# Patient Record
Sex: Female | Born: 1945 | Race: White | Marital: Married | Smoking: Former smoker
Health system: Southern US, Community
[De-identification: ages and names within clinical notes are randomized; demographics above are authoritative.]

## PROBLEM LIST (undated history)

## (undated) DIAGNOSIS — E785 Hyperlipidemia, unspecified: Secondary | ICD-10-CM

## (undated) DIAGNOSIS — F419 Anxiety disorder, unspecified: Secondary | ICD-10-CM

## (undated) DIAGNOSIS — G20 Parkinson's disease: Secondary | ICD-10-CM

## (undated) DIAGNOSIS — M8580 Other specified disorders of bone density and structure, unspecified site: Secondary | ICD-10-CM

## (undated) DIAGNOSIS — C3490 Malignant neoplasm of unspecified part of unspecified bronchus or lung: Secondary | ICD-10-CM

## (undated) DIAGNOSIS — M1990 Unspecified osteoarthritis, unspecified site: Secondary | ICD-10-CM

## (undated) DIAGNOSIS — K449 Diaphragmatic hernia without obstruction or gangrene: Secondary | ICD-10-CM

## (undated) DIAGNOSIS — G20A1 Parkinson's disease without dyskinesia, without mention of fluctuations: Secondary | ICD-10-CM

## (undated) DIAGNOSIS — I7300 Raynaud's syndrome without gangrene: Secondary | ICD-10-CM

## (undated) DIAGNOSIS — K219 Gastro-esophageal reflux disease without esophagitis: Secondary | ICD-10-CM

## (undated) DIAGNOSIS — T7840XA Allergy, unspecified, initial encounter: Secondary | ICD-10-CM

## (undated) DIAGNOSIS — J45909 Unspecified asthma, uncomplicated: Secondary | ICD-10-CM

## (undated) HISTORY — DX: Other specified disorders of bone density and structure, unspecified site: M85.80

## (undated) HISTORY — DX: Gastro-esophageal reflux disease without esophagitis: K21.9

## (undated) HISTORY — DX: Parkinson's disease without dyskinesia, without mention of fluctuations: G20.A1

## (undated) HISTORY — DX: Unspecified asthma, uncomplicated: J45.909

## (undated) HISTORY — DX: Hyperlipidemia, unspecified: E78.5

## (undated) HISTORY — PX: EYE SURGERY: SHX253

## (undated) HISTORY — DX: Diaphragmatic hernia without obstruction or gangrene: K44.9

## (undated) HISTORY — DX: Malignant neoplasm of unspecified part of unspecified bronchus or lung: C34.90

## (undated) HISTORY — DX: Parkinson's disease: G20

## (undated) HISTORY — DX: Raynaud's syndrome without gangrene: I73.00

## (undated) HISTORY — DX: Allergy, unspecified, initial encounter: T78.40XA

## (undated) HISTORY — PX: LUNG REMOVAL, PARTIAL: SHX233

## (undated) HISTORY — DX: Unspecified osteoarthritis, unspecified site: M19.90

## (undated) HISTORY — DX: Anxiety disorder, unspecified: F41.9

---

## 1993-03-08 HISTORY — PX: BACK SURGERY: SHX140

## 1993-03-08 HISTORY — PX: BREAST BIOPSY: SHX20

## 2015-03-09 HISTORY — PX: CATARACT EXTRACTION, BILATERAL: SHX1313

## 2015-08-20 HISTORY — PX: COLONOSCOPY: SHX174

## 2016-08-24 HISTORY — PX: REFRACTIVE SURGERY: SHX103

## 2018-03-08 DIAGNOSIS — C801 Malignant (primary) neoplasm, unspecified: Secondary | ICD-10-CM

## 2018-03-08 HISTORY — DX: Malignant (primary) neoplasm, unspecified: C80.1

## 2019-03-09 HISTORY — PX: ESOPHAGOGASTRODUODENOSCOPY: SHX1529

## 2020-07-23 ENCOUNTER — Encounter: Payer: Self-pay | Admitting: Family Medicine

## 2020-07-23 ENCOUNTER — Other Ambulatory Visit: Payer: Self-pay

## 2020-07-23 ENCOUNTER — Ambulatory Visit (INDEPENDENT_AMBULATORY_CARE_PROVIDER_SITE_OTHER): Payer: Medicare Other | Admitting: Family Medicine

## 2020-07-23 VITALS — BP 107/68 | HR 62 | Temp 97.9°F | Ht 65.5 in | Wt 125.6 lb

## 2020-07-23 DIAGNOSIS — G20 Parkinson's disease: Secondary | ICD-10-CM

## 2020-07-23 DIAGNOSIS — K219 Gastro-esophageal reflux disease without esophagitis: Secondary | ICD-10-CM | POA: Diagnosis not present

## 2020-07-23 DIAGNOSIS — F411 Generalized anxiety disorder: Secondary | ICD-10-CM | POA: Diagnosis not present

## 2020-07-23 DIAGNOSIS — J302 Other seasonal allergic rhinitis: Secondary | ICD-10-CM | POA: Diagnosis not present

## 2020-07-23 DIAGNOSIS — Z85118 Personal history of other malignant neoplasm of bronchus and lung: Secondary | ICD-10-CM

## 2020-07-23 DIAGNOSIS — R000 Tachycardia, unspecified: Secondary | ICD-10-CM

## 2020-07-23 DIAGNOSIS — L989 Disorder of the skin and subcutaneous tissue, unspecified: Secondary | ICD-10-CM

## 2020-07-23 NOTE — Progress Notes (Signed)
New Patient Office Visit  Assessment & Plan:  1. Seasonal allergies Discussed possibly adding Singulair in the future.  - fluticasone (FLONASE) 50 MCG/ACT nasal spray; Place 1 spray into both nostrils daily. - Ambulatory referral to ENT  2. Gastroesophageal reflux disease without esophagitis Well controlled on current regimen. Patient is going to try to wean off. - omeprazole (PRILOSEC) 20 MG capsule; Take 1 capsule by mouth daily at 2 PM.  3. Generalized anxiety disorder Discussed I do not prescribe Clonazepam for regular/long term use. She is weaning off. Currently taking every other day. Advised to only use twice weekly as needed. Does not feel she will need anything else for anxiety as she comes off of it. Currently well controlled.  - escitalopram (LEXAPRO) 20 MG tablet; Take 1 tablet by mouth daily at 2 PM. - clonazePAM (KLONOPIN) 0.5 MG tablet; Take 1 tablet by mouth daily at 2 PM.  4. Parkinson disease ([place]) Well controlled on current regimen.  - carbidopa-levodopa (SINEMET IR) 25-100 MG tablet; Take 2 tablets by mouth in the morning, at noon, and at bedtime.  Dispense: 180 tablet; Refill: 2 - Ambulatory referral to Neurology  5. Tachycardia Well controlled on current regimen.  - atenolol (TENORMIN) 25 MG tablet; Take 25 mg by mouth daily.  6. History of lung cancer - Ambulatory referral to Hematology / Oncology  7. Skin lesion of back - triamcinolone cream (KENALOG) 0.1 %; Apply 1 application topically 2 (two) times daily.  Dispense: 80 g; Refill: 1   Follow-up: Return in about 6 weeks (around 09/03/2020) for Anxiety.   Hendricks Limes, MSN, APRN, FNP-C [facility]  Subjective:  Patient ID: Deborah [name], female    DOB: [birth year]  Age: 75 y.o. MRN: [id number]  Patient Care Team: [name], FNP as PCP - General (Family Medicine)  CC:  Chief Complaint  Patient presents with  . New Patient (Initial Visit)    [place]   . Establish  Care  . spot on back     Patient has a spot on her back that she would like checked.     HPI Deborah [name]. [name] presents to establish care. She moved to [place] from [place] on April 1st.   Seasonal allergies: patient reports sinuses are awful this time of year. She is currently taking Zyrtec and Flonase daily. Has been seeing an ENT in [place].   GERD: patient reports she is currently taking Prilosec, but is weaning herself off. She was started on 40 mg after an EGD and has decreased herself to 20 mg. She plans to come off of it when her current supply is gone.   Anxiety: patient is currently taking Lexapro 20 mg once daily as well as Clonazepam 0.5 mg which was prescribed as once daily. She has been trying to wean herself off of them and has been taking the Clonazepam every other day for months. Twice she has had to take one on the day she skipped due to shaking. She still has ~#60 tablets at home.   GAD 7 : Generalized Anxiety Score 07/23/2020  Nervous, Anxious, on Edge 0  Control/stop worrying 0  Worry too much - different things 0  Trouble relaxing 0  Restless 0  Easily annoyed or irritable 0  Afraid - awful might happen 0  Total GAD 7 Score 0  Anxiety Difficulty Not difficult at all   Depression screen [facility] 2/9 07/23/2020  Decreased Interest 0  Down, Depressed, Hopeless 0  PHQ - 2 Score 0  Altered sleeping 0  Tired, decreased energy 0  Change in appetite 0  Feeling bad or failure about yourself  0  Trouble concentrating 0  Moving slowly or fidgety/restless 0  Suicidal thoughts 0  PHQ-9 Score 0  Difficult doing work/chores Not difficult at all    Parkinson's Disease: taking Sinemet. Previously managed by neurology.   Patient reports she takes Atenolol because she was having episodes of her heart racing. States she did see a cardiologist and was told her heart was doing something, but that it wasn't dangerous (PVCs?). The atenolol is effective.   History of lung CA:  patient reports she was diagnosed with stage I lung cancer in 2020. They were able to remove the small mass and she did not have to complete chemo or radiation. She was last seen on 06/02/2020 and reports they were going to see her one more time in 6 months before changing her to a yearly follow-up. Reports they do scans every time she has a follow-up.   Patient reports a spot on her back that has been present for a long time, that itches.    Review of Systems  Constitutional: Negative for chills, fever, malaise/fatigue and weight loss.  HENT: Negative for ear discharge, ear pain, nosebleeds and tinnitus.   Eyes: Negative for blurred vision, double vision, pain, discharge and redness.  Respiratory: Negative for cough, shortness of breath and wheezing.   Cardiovascular: Negative for chest pain, palpitations and leg swelling.  Gastrointestinal: Negative for abdominal pain, constipation, diarrhea, heartburn, nausea and vomiting.  Genitourinary: Negative for dysuria, frequency and urgency.  Musculoskeletal: Negative for myalgias.  Skin: Positive for itching. Negative for rash.  Neurological: Positive for tremors. Negative for dizziness, seizures, weakness and headaches.  Psychiatric/Behavioral: Negative for depression, substance abuse and suicidal ideas. The patient is not nervous/anxious.     Current Outpatient Medications:  .  atenolol (TENORMIN) 25 MG tablet, Take 25 mg by mouth daily., Disp: , Rfl:  .  carbidopa-levodopa (SINEMET IR) 25-100 MG tablet, Take 2 tablets by mouth in the morning, at noon, and at bedtime., Disp: , Rfl:  .  clonazePAM (KLONOPIN) 0.5 MG tablet, Take 1 tablet by mouth daily at 2 PM., Disp: , Rfl:  .  escitalopram (LEXAPRO) 20 MG tablet, Take 1 tablet by mouth daily at 2 PM., Disp: , Rfl:  .  fluticasone (FLONASE) 50 MCG/ACT nasal spray, Place 1 spray into both nostrils daily., Disp: , Rfl:  .  omeprazole (PRILOSEC) 20 MG capsule, Take 1 capsule by mouth daily at 2 PM.,  Disp: , Rfl:   Allergies  Allergen Reactions  . Amoxicillin-Pot Clavulanate Nausea And Vomiting  . Cefdinir Nausea And Vomiting  . Ciprofloxacin Hives and Itching  . Other     Bioxin   . Sulfa Antibiotics Hives and Itching    Past Medical History:  Diagnosis Date  . Anxiety   . Cancer ([place]) 2020   lung cancer   . Parkinson's syndrome [facility])     Past Surgical History:  Procedure Laterality Date  . BACK SURGERY  1995  . LUNG SURGERY      Family History  Problem Relation Age of Onset  . Dementia Mother   . Heart disease Father   . Diabetes Father   . Diabetes Brother     Social History   Socioeconomic History  . Marital status: Married    Spouse name: Not on file  . Number of children: Not  on file  . Years of education: Not on file  . Highest education level: Not on file  Occupational History  . Not on file  Tobacco Use  . Smoking status: Former Smoker    Types: Cigarettes    Quit date: 07/23/1960    Years since quitting: 60.0  . Smokeless tobacco: Never Used  Vaping Use  . Vaping Use: Never used  Substance and Sexual Activity  . Alcohol use: Yes    Comment: occ  . Drug use: Never  . Sexual activity: Yes    Birth control/protection: None  Other Topics Concern  . Not on file  Social History Narrative  . Not on file   Social Determinants of Health   Financial Resource Strain: Not on file  Food Insecurity: Not on file  Transportation Needs: Not on file  Physical Activity: Not on file  Stress: Not on file  Social Connections: Not on file  Intimate Partner Violence: Not on file    Objective:   Today's Vitals: BP 107/68   Pulse 62   Temp 97.9 F (36.6 C) (Temporal)   Ht 5' 5.5" (1.664 m)   Wt 125 lb 9.6 oz (57 kg)   SpO2 97%   BMI 20.58 kg/m   Physical Exam Vitals reviewed.  Constitutional:      General: She is not in acute distress.    Appearance: Normal appearance. She is normal weight. She is not ill-appearing, toxic-appearing or  diaphoretic.  HENT:     Head: Normocephalic and atraumatic.  Eyes:     General: No scleral icterus.       Right eye: No discharge.        Left eye: No discharge.     Conjunctiva/sclera: Conjunctivae normal.  Cardiovascular:     Rate and Rhythm: Normal rate and regular rhythm.     Heart sounds: Normal heart sounds. No murmur heard. No friction rub. No gallop.   Pulmonary:     Effort: Pulmonary effort is normal. No respiratory distress.     Breath sounds: Normal breath sounds. No stridor. No wheezing, rhonchi or rales.  Musculoskeletal:        General: Normal range of motion.     Cervical back: Normal range of motion.  Skin:    General: Skin is warm and dry.     Capillary Refill: Capillary refill takes less than 2 seconds.  Neurological:     General: No focal deficit present.     Mental Status: She is alert and oriented to person, place, and time. Mental status is at baseline.  Psychiatric:        Mood and Affect: Mood normal.        Behavior: Behavior normal.        Thought Content: Thought content normal.        Judgment: Judgment normal.

## 2020-07-24 ENCOUNTER — Telehealth: Payer: Self-pay | Admitting: Family Medicine

## 2020-07-24 MED ORDER — CARBIDOPA-LEVODOPA 25-100 MG PO TABS: 2.0000 | ORAL_TABLET | Freq: Three times a day (TID) | ORAL | 2 refills | Status: DC

## 2020-07-24 MED ORDER — TRIAMCINOLONE ACETONIDE 0.1 % EX CREA: 1.0000 "application " | TOPICAL_CREAM | Freq: Two times a day (BID) | CUTANEOUS | 1 refills | Status: DC

## 2020-07-24 NOTE — Telephone Encounter (Signed)
Sent!

## 2020-07-25 ENCOUNTER — Encounter: Payer: Self-pay | Admitting: Family Medicine

## 2020-07-28 ENCOUNTER — Encounter: Payer: Self-pay | Admitting: Family Medicine

## 2020-07-28 DIAGNOSIS — F411 Generalized anxiety disorder: Secondary | ICD-10-CM | POA: Insufficient documentation

## 2020-07-28 DIAGNOSIS — J302 Other seasonal allergic rhinitis: Secondary | ICD-10-CM | POA: Insufficient documentation

## 2020-07-28 DIAGNOSIS — R000 Tachycardia, unspecified: Secondary | ICD-10-CM | POA: Insufficient documentation

## 2020-07-28 DIAGNOSIS — G20 Parkinson's disease: Secondary | ICD-10-CM | POA: Insufficient documentation

## 2020-07-28 DIAGNOSIS — G20A1 Parkinson's disease without dyskinesia, without mention of fluctuations: Secondary | ICD-10-CM | POA: Insufficient documentation

## 2020-07-28 DIAGNOSIS — Z85118 Personal history of other malignant neoplasm of bronchus and lung: Secondary | ICD-10-CM | POA: Insufficient documentation

## 2020-07-28 DIAGNOSIS — K219 Gastro-esophageal reflux disease without esophagitis: Secondary | ICD-10-CM | POA: Insufficient documentation

## 2020-08-11 ENCOUNTER — Ambulatory Visit (HOSPITAL_COMMUNITY): Payer: Medicare Other | Admitting: Hematology

## 2020-08-14 ENCOUNTER — Observation Stay (HOSPITAL_COMMUNITY): Payer: Medicare Other

## 2020-08-14 ENCOUNTER — Other Ambulatory Visit: Payer: Self-pay

## 2020-08-14 ENCOUNTER — Encounter (HOSPITAL_COMMUNITY): Payer: Self-pay | Admitting: Emergency Medicine

## 2020-08-14 ENCOUNTER — Inpatient Hospital Stay (HOSPITAL_COMMUNITY)
Admission: EM | Admit: 2020-08-14 | Discharge: 2020-08-25 | DRG: 682 | Disposition: A | Discharge status: Skilled Nursing Facility | Payer: Medicare Other | Attending: Internal Medicine | Admitting: Internal Medicine

## 2020-08-14 DIAGNOSIS — Z87891 Personal history of nicotine dependence: Secondary | ICD-10-CM

## 2020-08-14 DIAGNOSIS — E860 Dehydration: Secondary | ICD-10-CM

## 2020-08-14 DIAGNOSIS — K219 Gastro-esophageal reflux disease without esophagitis: Secondary | ICD-10-CM

## 2020-08-14 DIAGNOSIS — Z902 Acquired absence of lung [part of]: Secondary | ICD-10-CM

## 2020-08-14 DIAGNOSIS — Z8249 Family history of ischemic heart disease and other diseases of the circulatory system: Secondary | ICD-10-CM

## 2020-08-14 DIAGNOSIS — E872 Acidosis: Secondary | ICD-10-CM | POA: Diagnosis present

## 2020-08-14 DIAGNOSIS — Z85118 Personal history of other malignant neoplasm of bronchus and lung: Secondary | ICD-10-CM

## 2020-08-14 DIAGNOSIS — G9341 Metabolic encephalopathy: Secondary | ICD-10-CM | POA: Diagnosis present

## 2020-08-14 DIAGNOSIS — D72818 Other decreased white blood cell count: Secondary | ICD-10-CM | POA: Diagnosis present

## 2020-08-14 DIAGNOSIS — F32A Depression, unspecified: Secondary | ICD-10-CM | POA: Diagnosis present

## 2020-08-14 DIAGNOSIS — G20 Parkinson's disease: Secondary | ICD-10-CM

## 2020-08-14 DIAGNOSIS — Z20822 Contact with and (suspected) exposure to covid-19: Secondary | ICD-10-CM | POA: Diagnosis present

## 2020-08-14 DIAGNOSIS — A419 Sepsis, unspecified organism: Secondary | ICD-10-CM

## 2020-08-14 DIAGNOSIS — J90 Pleural effusion, not elsewhere classified: Secondary | ICD-10-CM | POA: Diagnosis present

## 2020-08-14 DIAGNOSIS — E876 Hypokalemia: Secondary | ICD-10-CM

## 2020-08-14 DIAGNOSIS — F411 Generalized anxiety disorder: Secondary | ICD-10-CM | POA: Diagnosis not present

## 2020-08-14 DIAGNOSIS — E8770 Fluid overload, unspecified: Secondary | ICD-10-CM | POA: Diagnosis not present

## 2020-08-14 DIAGNOSIS — N179 Acute kidney failure, unspecified: Secondary | ICD-10-CM | POA: Diagnosis not present

## 2020-08-14 DIAGNOSIS — G20A1 Parkinson's disease without dyskinesia, without mention of fluctuations: Secondary | ICD-10-CM | POA: Diagnosis present

## 2020-08-14 DIAGNOSIS — Z833 Family history of diabetes mellitus: Secondary | ICD-10-CM

## 2020-08-14 DIAGNOSIS — E785 Hyperlipidemia, unspecified: Secondary | ICD-10-CM | POA: Diagnosis present

## 2020-08-14 DIAGNOSIS — J45909 Unspecified asthma, uncomplicated: Secondary | ICD-10-CM | POA: Diagnosis present

## 2020-08-14 DIAGNOSIS — T424X5A Adverse effect of benzodiazepines, initial encounter: Secondary | ICD-10-CM | POA: Diagnosis present

## 2020-08-14 DIAGNOSIS — I10 Essential (primary) hypertension: Secondary | ICD-10-CM | POA: Diagnosis present

## 2020-08-14 DIAGNOSIS — R6510 Systemic inflammatory response syndrome (SIRS) of non-infectious origin without acute organ dysfunction: Secondary | ICD-10-CM | POA: Diagnosis present

## 2020-08-14 DIAGNOSIS — Z79899 Other long term (current) drug therapy: Secondary | ICD-10-CM

## 2020-08-14 DIAGNOSIS — R0600 Dyspnea, unspecified: Secondary | ICD-10-CM

## 2020-08-14 DIAGNOSIS — D6959 Other secondary thrombocytopenia: Secondary | ICD-10-CM | POA: Diagnosis present

## 2020-08-14 DIAGNOSIS — E875 Hyperkalemia: Secondary | ICD-10-CM | POA: Diagnosis not present

## 2020-08-14 DIAGNOSIS — D696 Thrombocytopenia, unspecified: Secondary | ICD-10-CM

## 2020-08-14 DIAGNOSIS — I7300 Raynaud's syndrome without gangrene: Secondary | ICD-10-CM | POA: Diagnosis present

## 2020-08-14 DIAGNOSIS — E871 Hypo-osmolality and hyponatremia: Secondary | ICD-10-CM

## 2020-08-14 LAB — CBC WITH DIFFERENTIAL/PLATELET
Abs Immature Granulocytes: 0.02 10*3/uL (ref 0.00–0.07)
Abs Immature Granulocytes: 0.01 10*3/uL (ref 0.00–0.07)
Basophils Absolute: 0 10*3/uL (ref 0.0–0.1)
Basophils Absolute: 0 10*3/uL (ref 0.0–0.1)
Basophils Relative: 1 %
Basophils Relative: 0 %
Eosinophils Absolute: 0 10*3/uL (ref 0.0–0.5)
Eosinophils Absolute: 0 10*3/uL (ref 0.0–0.5)
Eosinophils Relative: 0 %
Eosinophils Relative: 0 %
HCT: 36.6 % (ref 36.0–46.0)
HCT: 32.7 % — ABNORMAL LOW (ref 36.0–46.0)
Hemoglobin: 12.5 g/dL (ref 12.0–15.0)
Hemoglobin: 11.1 g/dL — ABNORMAL LOW (ref 12.0–15.0)
Immature Granulocytes: 1 %
Immature Granulocytes: 1 %
Lymphocytes Relative: 11 %
Lymphocytes Relative: 14 %
Lymphs Abs: 0.2 10*3/uL — ABNORMAL LOW (ref 0.7–4.0)
Lymphs Abs: 0.3 10*3/uL — ABNORMAL LOW (ref 0.7–4.0)
MCH: 31.6 pg (ref 26.0–34.0)
MCH: 31.2 pg (ref 26.0–34.0)
MCHC: 34.2 g/dL (ref 30.0–36.0)
MCHC: 33.9 g/dL (ref 30.0–36.0)
MCV: 92.4 fL (ref 80.0–100.0)
MCV: 91.9 fL (ref 80.0–100.0)
Monocytes Absolute: 0.1 10*3/uL (ref 0.1–1.0)
Monocytes Absolute: 0.1 10*3/uL (ref 0.1–1.0)
Monocytes Relative: 5 %
Monocytes Relative: 5 %
Neutro Abs: 1.6 10*3/uL — ABNORMAL LOW (ref 1.7–7.7)
Neutro Abs: 1.5 10*3/uL — ABNORMAL LOW (ref 1.7–7.7)
Neutrophils Relative %: 82 %
Neutrophils Relative %: 80 %
Platelets: 62 10*3/uL — ABNORMAL LOW (ref 150–400)
Platelets: 54 10*3/uL — ABNORMAL LOW (ref 150–400)
RBC: 3.96 MIL/uL (ref 3.87–5.11)
RBC: 3.56 MIL/uL — ABNORMAL LOW (ref 3.87–5.11)
RDW: 14 % (ref 11.5–15.5)
RDW: 14 % (ref 11.5–15.5)
WBC Morphology: INCREASED
WBC Morphology: INCREASED
WBC: 2 10*3/uL — ABNORMAL LOW (ref 4.0–10.5)
WBC: 1.9 10*3/uL — ABNORMAL LOW (ref 4.0–10.5)
nRBC: 0 % (ref 0.0–0.2)
nRBC: 0 % (ref 0.0–0.2)

## 2020-08-14 LAB — COMPREHENSIVE METABOLIC PANEL
ALT: 24 U/L (ref 0–44)
AST: 165 U/L — ABNORMAL HIGH (ref 15–41)
Albumin: 3.1 g/dL — ABNORMAL LOW (ref 3.5–5.0)
Alkaline Phosphatase: 82 U/L (ref 38–126)
Anion gap: 8 (ref 5–15)
BUN: 24 mg/dL — ABNORMAL HIGH (ref 8–23)
CO2: 19 mmol/L — ABNORMAL LOW (ref 22–32)
Calcium: 8.1 mg/dL — ABNORMAL LOW (ref 8.9–10.3)
Chloride: 98 mmol/L (ref 98–111)
Creatinine, Ser: 0.98 mg/dL (ref 0.44–1.00)
GFR, Estimated: >60 mL/min (ref >60)
Glucose, Bld: 111 mg/dL — ABNORMAL HIGH (ref 70–99)
Potassium: 3.1 mmol/L — ABNORMAL LOW (ref 3.5–5.1)
Sodium: 125 mmol/L — ABNORMAL LOW (ref 135–145)
Total Bilirubin: 0.9 mg/dL (ref 0.3–1.2)
Total Protein: 6 g/dL — ABNORMAL LOW (ref 6.5–8.1)

## 2020-08-14 LAB — URINALYSIS, ROUTINE W REFLEX MICROSCOPIC
Bacteria, UA: NONE SEEN
Bilirubin Urine: NEGATIVE
Glucose, UA: NEGATIVE mg/dL
Ketones, ur: 5 mg/dL — AB
Leukocytes,Ua: NEGATIVE
Nitrite: NEGATIVE
Protein, ur: 100 mg/dL — AB
Specific Gravity, Urine: 1.015 (ref 1.005–1.030)
pH: 6 (ref 5.0–8.0)

## 2020-08-14 LAB — LACTIC ACID, PLASMA: Lactic Acid, Venous: 2.4 mmol/L (ref 0.5–1.9)

## 2020-08-14 LAB — PROCALCITONIN: Procalcitonin: 4.52 ng/mL

## 2020-08-14 LAB — RESP PANEL BY RT-PCR (FLU A&B, COVID) ARPGX2
Influenza A by PCR: NEGATIVE
Influenza B by PCR: NEGATIVE
SARS Coronavirus 2 by RT PCR: NEGATIVE

## 2020-08-14 MED ORDER — SODIUM CHLORIDE 0.9 % IV SOLN: 2.0000 g | Freq: Two times a day (BID) | INTRAVENOUS | Status: DC

## 2020-08-14 MED ORDER — ESCITALOPRAM OXALATE 10 MG PO TABS
20.0000 mg | ORAL_TABLET | Freq: Every day | ORAL | Status: DC
  Administered 2020-08-15 – 2020-08-19 (×5): 20 mg via ORAL
  Filled 2020-08-14 (×5): qty 2

## 2020-08-14 MED ORDER — SODIUM CHLORIDE 0.9 % IV BOLUS
1000.0000 mL | Freq: Once | INTRAVENOUS | Status: AC
  Administered 2020-08-14: 1000 mL via INTRAVENOUS

## 2020-08-14 MED ORDER — VANCOMYCIN HCL IN DEXTROSE 1-5 GM/200ML-% IV SOLN
1000.0000 mg | INTRAVENOUS | Status: DC
  Administered 2020-08-15 – 2020-08-17 (×3): 1000 mg via INTRAVENOUS
  Filled 2020-08-14 (×3): qty 200

## 2020-08-14 MED ORDER — LACTATED RINGERS IV BOLUS (SEPSIS)
500.0000 mL | Freq: Once | INTRAVENOUS | Status: AC
  Administered 2020-08-14: 500 mL via INTRAVENOUS

## 2020-08-14 MED ORDER — SODIUM CHLORIDE 0.9 % IV BOLUS
500.0000 mL | Freq: Once | INTRAVENOUS | Status: AC
  Administered 2020-08-14: 500 mL via INTRAVENOUS

## 2020-08-14 MED ORDER — LACTATED RINGERS IV BOLUS (SEPSIS): 1000.0000 mL | Freq: Once | INTRAVENOUS | Status: DC

## 2020-08-14 MED ORDER — SODIUM CHLORIDE 0.9 % IV SOLN
2.0000 g | Freq: Two times a day (BID) | INTRAVENOUS | Status: DC
  Administered 2020-08-14 – 2020-08-18 (×9): 2 g via INTRAVENOUS
  Filled 2020-08-14 (×10): qty 2

## 2020-08-14 MED ORDER — IOHEXOL 300 MG/ML  SOLN
100.0000 mL | Freq: Once | INTRAMUSCULAR | Status: AC | PRN
  Administered 2020-08-14: 100 mL via INTRAVENOUS

## 2020-08-14 MED ORDER — LACTATED RINGERS IV BOLUS (SEPSIS): 500.0000 mL | Freq: Once | INTRAVENOUS | Status: DC

## 2020-08-14 MED ORDER — CARBIDOPA-LEVODOPA 25-100 MG PO TABS
2.0000 | ORAL_TABLET | Freq: Three times a day (TID) | ORAL | Status: DC
  Administered 2020-08-15 – 2020-08-25 (×23): 2 via ORAL
  Filled 2020-08-14 (×28): qty 2

## 2020-08-14 MED ORDER — ACETAMINOPHEN 500 MG PO TABS
1000.0000 mg | ORAL_TABLET | Freq: Once | ORAL | Status: AC
  Administered 2020-08-14: 1000 mg via ORAL
  Filled 2020-08-14: qty 2

## 2020-08-14 MED ORDER — ACETAMINOPHEN 325 MG PO TABS
650.0000 mg | ORAL_TABLET | Freq: Four times a day (QID) | ORAL | Status: DC | PRN
  Administered 2020-08-15 – 2020-08-19 (×7): 650 mg via ORAL
  Filled 2020-08-14 (×8): qty 2

## 2020-08-14 MED ORDER — CLONAZEPAM 0.5 MG PO TABS
0.5000 mg | ORAL_TABLET | Freq: Every day | ORAL | Status: DC
  Administered 2020-08-15 – 2020-08-19 (×5): 0.5 mg via ORAL
  Filled 2020-08-14 (×6): qty 1

## 2020-08-14 MED ORDER — POTASSIUM CHLORIDE CRYS ER 20 MEQ PO TBCR
40.0000 meq | EXTENDED_RELEASE_TABLET | Freq: Once | ORAL | Status: AC
  Administered 2020-08-14: 40 meq via ORAL
  Filled 2020-08-14: qty 2

## 2020-08-14 MED ORDER — POTASSIUM CHLORIDE IN NACL 20-0.9 MEQ/L-% IV SOLN: INTRAVENOUS | Status: DC

## 2020-08-14 MED ORDER — METRONIDAZOLE 500 MG/100ML IV SOLN
500.0000 mg | Freq: Three times a day (TID) | INTRAVENOUS | Status: DC
  Administered 2020-08-14 – 2020-08-20 (×17): 500 mg via INTRAVENOUS
  Filled 2020-08-14 (×17): qty 100

## 2020-08-14 MED ORDER — PANTOPRAZOLE SODIUM 40 MG PO TBEC
40.0000 mg | DELAYED_RELEASE_TABLET | Freq: Every day | ORAL | Status: DC
  Administered 2020-08-15 – 2020-08-25 (×7): 40 mg via ORAL
  Filled 2020-08-14 (×10): qty 1

## 2020-08-14 MED ORDER — VANCOMYCIN HCL 1250 MG/250ML IV SOLN
1250.0000 mg | Freq: Once | INTRAVENOUS | Status: AC
  Administered 2020-08-14: 1250 mg via INTRAVENOUS
  Filled 2020-08-14: qty 250

## 2020-08-14 NOTE — ED Provider Notes (Signed)
[facility] EMERGENCY DEPARTMENT Provider Note   CSN: [id number] Arrival date & time: 08/14/20  6734     History Chief Complaint  Patient presents with   Weakness    Deborah [name] is a 75 y.o. female.  The history is provided by the patient. No language interpreter was used.  Weakness Severity:  Severe Onset quality:  Gradual Timing:  Constant Progression:  Worsening Chronicity:  New Context: recent infection   Relieved by:  Nothing Worsened by:  Nothing Ineffective treatments:  None tried Associated symptoms: no abdominal pain, no nausea, no near-syncope and no shortness of breath       Past Medical History:  Diagnosis Date   Anxiety    Arthritis    Asthma    Cancer ([place]) 2020   lung cancer    GERD (gastroesophageal reflux disease)    Hiatal hernia    Hyperlipidemia    Osteopenia    Parkinson's syndrome ([place])    Raynaud's disease     Patient Active Problem List   Diagnosis Date Noted   Seasonal allergies 07/28/2020   GERD (gastroesophageal reflux disease) 07/28/2020   Generalized anxiety disorder 07/28/2020   Parkinson disease ([place]) 07/28/2020   Tachycardia 07/28/2020   History of lung cancer 07/28/2020    Past Surgical History:  Procedure Laterality Date   BACK SURGERY  1995   Decompress disc lumbar   BREAST BIOPSY  1995   CATARACT EXTRACTION, BILATERAL Bilateral 2017   COLONOSCOPY  08/20/2015   repeat in 5 years   ESOPHAGOGASTRODUODENOSCOPY  2021   LUNG REMOVAL, PARTIAL Right    REFRACTIVE SURGERY Left 08/24/2016     OB History   No obstetric history on file.     Family History  Problem Relation Age of Onset   Dementia Mother    Heart disease Father    Diabetes Father    Prostate cancer Father    Diabetes Brother     Social History   Tobacco Use   Smoking status: Former    Pack years: 0.00    Types: Cigarettes    Quit date: 07/23/1960    Years since quitting: 60.1   Smokeless tobacco: Never  Vaping Use   Vaping Use:  Never used  Substance Use Topics   Alcohol use: Yes    Comment: occ   Drug use: Never    Home Medications Prior to Admission medications   Medication Sig Start Date End Date Taking? Authorizing Provider  atenolol (TENORMIN) 25 MG tablet Take 25 mg by mouth daily. 07/01/20  Yes [provider]  carbidopa-levodopa (SINEMET IR) 25-100 MG tablet Take 2 tablets by mouth in the morning, at noon, and at bedtime. 07/24/20  Yes Loman Brooklyn, FNP  clonazePAM (KLONOPIN) 0.5 MG tablet Take 1 tablet by mouth daily at 2 PM. 10/08/19  Yes [provider]  escitalopram (LEXAPRO) 20 MG tablet Take 1 tablet by mouth daily at 2 PM. 10/08/19  Yes [provider]  fluticasone (FLONASE) 50 MCG/ACT nasal spray Place 1 spray into both nostrils daily. 07/02/20  Yes [provider]  omeprazole (PRILOSEC) 20 MG capsule Take 1 capsule by mouth daily at 2 PM. 10/08/19  Yes [provider]  triamcinolone cream (KENALOG) 0.1 % Apply 1 application topically 2 (two) times daily. Patient not taking: No sig reported 07/24/20   Loman Brooklyn, FNP    Allergies    Amoxicillin-pot clavulanate, Cefdinir, Ciprofloxacin, Other, and Sulfa antibiotics  Review of Systems  Review of Systems  Respiratory:  Negative for shortness of breath.   Cardiovascular:  Negative for near-syncope.  Gastrointestinal:  Negative for abdominal pain and nausea.  Neurological:  Positive for weakness.  All other systems reviewed and are negative.  Physical Exam Updated Vital Signs BP (!) 117/56   Pulse 76   Temp 99.1 F (37.3 C) (Oral)   Resp 20   Ht 5\' 6"  (1.676 m)   Wt 55.3 kg   SpO2 98%   BMI 19.69 kg/m   Physical Exam Vitals and nursing note reviewed.  Constitutional:      Appearance: She is well-developed.  HENT:     Head: Normocephalic.     Mouth/Throat:     Mouth: Mucous membranes are moist.  Cardiovascular:     Rate and Rhythm: Normal rate and regular rhythm.  Pulmonary:      Effort: Pulmonary effort is normal.  Abdominal:     General: Abdomen is flat. There is no distension.  Musculoskeletal:        General: Normal range of motion.     Cervical back: Normal range of motion.  Skin:    General: Skin is warm.  Neurological:     General: No focal deficit present.     Mental Status: She is alert and oriented to person, place, and time.    ED Results / Procedures / Treatments   Labs (all labs ordered are listed, but only abnormal results are displayed) Labs Reviewed  CBC WITH DIFFERENTIAL/PLATELET - Abnormal; Notable for the following components:      Result Value   WBC 2.0 (*)    Platelets 62 (*)    Neutro Abs 1.6 (*)    Lymphs Abs 0.2 (*)    All other components within normal limits  COMPREHENSIVE METABOLIC PANEL - Abnormal; Notable for the following components:   Sodium 125 (*)    Potassium 3.1 (*)    CO2 19 (*)    Glucose, Bld 111 (*)    BUN 24 (*)    Calcium 8.1 (*)    Total Protein 6.0 (*)    Albumin 3.1 (*)    AST 165 (*)    All other components within normal limits  RESP PANEL BY RT-PCR (FLU A&B, COVID) ARPGX2  URINALYSIS, ROUTINE W REFLEX MICROSCOPIC    EKG None  Radiology No results found.  Procedures Procedures   Medications Ordered in ED Medications  sodium chloride 0.9 % bolus 1,000 mL (has no administration in time range)  potassium chloride SA (KLOR-CON) CR tablet 40 mEq (has no administration in time range)  sodium chloride 0.9 % bolus 500 mL (500 mLs Intravenous New Bag/Given 08/14/20 1114)    ED Course  I have reviewed the triage vital signs and the nursing notes.  Pertinent labs & imaging results that were available during my care of the patient were reviewed by me and considered in my medical decision making (see chart for details).    MDM Rules/Calculators/A&P                         MDM:  Pt given iv Ns x 500 Potassium 40 meq.   Final Clinical Impression(s) / ED Diagnoses Final diagnoses:  Dehydration   Hyponatremia  Hypokalemia    Rx / DC Orders ED Discharge Orders     None        Fransico Meadow, [place] 08/14/20 1611    Horton, Alvin Critchley, DO 08/16/20  0821  

## 2020-08-14 NOTE — H&P (Signed)
TRH H&P   Patient Demographics:    Deborah [name], is a 75 y.o. female  MRN: [id number]   DOB - 04-02-1945  Admit Date - 08/14/2020  Outpatient Primary MD for the patient is Loman Brooklyn, FNP  Referring MD/NP/PA: PA Sofia  Patient coming from: Home  Chief Complaint  Patient presents with   Weakness      HPI:    Deborah [name]  is a 75 y.o. female, with past medical history of Parkinson, depression, anxiety, GERD and hypertension, patient presents to ED secondary to multiple complaints, including nausea, vomiting, weakness and diarrhea, reports symptoms started 2 weeks ago after they ate in a steak place, reports both she and her husband he felt sick, but got better in few days, but her symptoms has persisted for last 2 weeks, reports nausea, poor appetite, initially some vomiting but none recently, still reports diarrhea 2-3 loose bowel movements per day, which prompted her to come to ED for fever, chills, blood in stools, coffee-ground emesis, no chest pain or shortness of breath. -In ED her work-up significant for sodium of 125, potassium of 3.1, AST of 164, bicarb of 19, white blood cell count of 2, and platelet count of 62,000, [facility] hospitalist consulted to admit.    Review of systems:    In addition to the HPI above,  No Fever-chills, does report generalized weakness and fatigue  no Headache, No changes with Vision or hearing, No problems swallowing food or Liquids, No Chest pain, Cough or Shortness of Breath, No Abdominal pain, she still reports some nausea, vomiting has resolved, she still reports diarrhea.  No Blood in stool or Urine, No dysuria, No new skin rashes or bruises, No new joints pains-aches,  No new weakness, tingling, numbness in any extremity, No recent weight gain or loss, No polyuria, polydypsia or polyphagia, No significant Mental Stressors.  A  full 10 point Review of Systems was done, except as stated above, all other Review of Systems were negative.   With Past History of the following :    Past Medical History:  Diagnosis Date   Anxiety    Arthritis    Asthma    Cancer ([place]) 2020   lung cancer    GERD (gastroesophageal reflux disease)    Hiatal hernia    Hyperlipidemia    Osteopenia    Parkinson's syndrome ([place])    Raynaud's disease       Past Surgical History:  Procedure Laterality Date   BACK SURGERY  1995   Decompress disc lumbar   BREAST BIOPSY  1995   CATARACT EXTRACTION, BILATERAL Bilateral 2017   COLONOSCOPY  08/20/2015   repeat in 5 years   ESOPHAGOGASTRODUODENOSCOPY  2021   LUNG REMOVAL, PARTIAL Right    REFRACTIVE SURGERY Left 08/24/2016      Social History:     Social History   Tobacco Use  Smoking status: Former    Pack years: 0.00    Types: Cigarettes    Quit date: 07/23/1960    Years since quitting: 60.1   Smokeless tobacco: Never  Substance Use Topics   Alcohol use: Yes    Comment: occ       Family History :     Family History  Problem Relation Age of Onset   Dementia Mother    Heart disease Father    Diabetes Father    Prostate cancer Father    Diabetes Brother      Home Medications:   Prior to Admission medications   Medication Sig Start Date End Date Taking? Authorizing Provider  atenolol (TENORMIN) 25 MG tablet Take 25 mg by mouth daily. 07/01/20  Yes [provider]  carbidopa-levodopa (SINEMET IR) 25-100 MG tablet Take 2 tablets by mouth in the morning, at noon, and at bedtime. 07/24/20  Yes Loman Brooklyn, FNP  clonazePAM (KLONOPIN) 0.5 MG tablet Take 1 tablet by mouth daily at 2 PM. 10/08/19  Yes [provider]  escitalopram (LEXAPRO) 20 MG tablet Take 1 tablet by mouth daily at 2 PM. 10/08/19  Yes [provider]  fluticasone (FLONASE) 50 MCG/ACT nasal spray Place 1 spray into both nostrils daily. 07/02/20  Yes [provider]   omeprazole (PRILOSEC) 20 MG capsule Take 1 capsule by mouth daily at 2 PM. 10/08/19  Yes [provider]  triamcinolone cream (KENALOG) 0.1 % Apply 1 application topically 2 (two) times daily. Patient not taking: No sig reported 07/24/20   Loman Brooklyn, FNP     Allergies:     Allergies  Allergen Reactions   Amoxicillin-Pot Clavulanate Nausea And Vomiting   Cefdinir Nausea And Vomiting   Ciprofloxacin Hives and Itching   Other     Bioxin    Sulfa Antibiotics Hives and Itching     Physical Exam:   Vitals  Blood pressure (!) 117/56, pulse 76, temperature 99.1 F (37.3 C), temperature source Oral, resp. rate 20, height 5\' 6"  (1.676 m), weight 55.3 kg, SpO2 98 %.   1. General frail, thin appearing female, laying in bed in no apparent distress  2. Normal affect and insight, Not Suicidal or Homicidal, Awake Alert, Oriented X 3.  3. No F.N deficits, ALL C.Nerves Intact, Strength 5/5 all 4 extremities, Sensation intact all 4 extremities, Plantars down going.  4. Ears and Eyes appear Normal, Conjunctivae clear, PERRLA. Moist Oral Mucosa.  5. Supple Neck, No JVD, No cervical lymphadenopathy appriciated, No Carotid Bruits.  6. Symmetrical Chest wall movement, Good air movement bilaterally, CTAB.  7. RRR, No Gallops, Rubs or Murmurs, No Parasternal Heave.  8. Positive Bowel Sounds, Abdomen Soft, No tenderness, No organomegaly appriciated,No rebound -guarding or rigidity.  9.  No Cyanosis, Normal Skin Turgor, No Skin Rash or Bruise.  10. Good muscle tone,  joints appear normal , no effusions, Normal ROM.  11. No Palpable Lymph Nodes in Neck or Axillae    Data Review:    CBC Recent Labs  Lab 08/14/20 1107  WBC 2.0*  HGB 12.5  HCT 36.6  PLT 62*  MCV 92.4  MCH 31.6  MCHC 34.2  RDW 14.0  LYMPHSABS 0.2*  MONOABS 0.1  EOSABS 0.0  BASOSABS 0.0    ------------------------------------------------------------------------------------------------------------------  Chemistries  Recent Labs  Lab 08/14/20 1107  NA 125*  K 3.1*  CL 98  CO2 19*  GLUCOSE 111*  BUN 24*  CREATININE 0.98  CALCIUM 8.1*  AST 165*  ALT 24  ALKPHOS 82  BILITOT 0.9   ------------------------------------------------------------------------------------------------------------------ estimated creatinine clearance is 44.6 mL/min (by C-G formula based on SCr of 0.98 mg/dL). ------------------------------------------------------------------------------------------------------------------ No results for input(s): TSH, T4TOTAL, T3FREE, THYROIDAB in the last 72 hours.  Invalid input(s): FREET3  Coagulation profile No results for input(s): INR, PROTIME in the last 168 hours. ------------------------------------------------------------------------------------------------------------------- No results for input(s): DDIMER in the last 72 hours. -------------------------------------------------------------------------------------------------------------------  Cardiac Enzymes No results for input(s): CKMB, TROPONINI, MYOGLOBIN in the last 168 hours.  Invalid input(s): CK ------------------------------------------------------------------------------------------------------------------ No results found for: BNP   ---------------------------------------------------------------------------------------------------------------  Urinalysis No results found for: COLORURINE, APPEARANCEUR, LABSPEC, [place], GLUCOSEU, HGBUR, BILIRUBINUR, KETONESUR, PROTEINUR, UROBILINOGEN, NITRITE, LEUKOCYTESUR  ----------------------------------------------------------------------------------------------------------------   Imaging Results:    No results found.  My personal review of EKG: Rhythm NSR,  Vent. rate 83 BPM PR interval 113 ms QRS duration 90 ms QT/QTcB 359/422  ms P-R-T axes 60 77 78 Sinus rhythm Borderline short PR interval   Assessment & Plan:    Active Problems:   GERD (gastroesophageal reflux disease)   Generalized anxiety disorder   Parkinson disease ([place])   Hyponatremia  GI/vomiting/diarrhea -This is most likely due to gastroenteritis, her abdominal exam is benign, she is afebrile, still reports diarrhea but improving, no blood in her stools, I will obtain GI panel, continue with IV hydration and Zofran .  Patient with low bicarb level secondary to diarrhea, but this should correct with IV fluids.  Hyponatremia -Due to volume depletion, will start on IV normal saline, repeat BMP in a.m. Mar[place] Kitchen  Hypokalemia -due to nausea, vomiting and diarrhea, will start on potassium supplements   elevated AST -Patient denies any alcohol use, will check hepatitis panel, and abdominal ultrasound including liver and spleen specially she has thrombocytopenia .  Thrombocytopenia -SCD for DVT prophylaxis, no chemical anticoagulation, will check ultrasound abdomen.  Hypertension -Her blood pressure is acceptable, I will hold atenolol given poor oral intake and diarrhea  Leukopenia -Most likely due to her GI infection, will monitor closely  Parkinson -Continue with home medications   GERD -continue with home medications  Depression/anxiety -continue with home medications   DVT Prophylaxis  SCDs   AM Labs Ordered, also please review Full Orders  Family Communication: Admission, patients condition and plan of care including tests being ordered have been discussed with the patient and Husband at [place] indicate understanding and agree with the plan and Code Status.  Code Status Full  Likely DC to  home  Condition GUARDED    Consults called: none    Admission status: observation    Time spent in minutes : 60 minutes   Phillips Climes M.D on 08/14/2020 at 4:07 PM   [facility] - Office  [phone]

## 2020-08-14 NOTE — Progress Notes (Signed)
Patient with fever 102.8, will start on broad-spectrum antibiotic vancomycin, cefepime and Flagyl especially with her leukopenia, I will obtain CT chest/abdomen/with IV contrast to evaluate for source of infection, will send blood cultures, urine analysis and lactic acid, will give 500 cc fluid bolus as well. Phillips Climes MD

## 2020-08-14 NOTE — Progress Notes (Signed)
Pharmacy Antibiotic Note  Deborah [name] is a 75 y.o. female admitted on 08/14/2020 with sepsis and intra-abdominal infection .  Pharmacy has been consulted for cefepime and vancomycin dosing.  Plan: Vancomycin 1250mg  (20mg /kg) loading dose followed by 1000mg  IV every 24 hours.  Goal trough 15-20 mcg/mL. The dose of cefepime will be adjusted to 2gm q12h based on renal function.  Height: 5\' 6"  (167.6 cm) Weight: 61.4 kg (135 lb 5.8 oz) IBW/kg (Calculated) : 59.3  Temp (24hrs), Avg:100.3 F (37.9 C), Min:99.1 F (37.3 C), Max:102.8 F (39.3 C)  Recent Labs  Lab 08/14/20 1107 08/14/20 1734  WBC 2.0* 1.9*  CREATININE 0.98  --     Estimated Creatinine Clearance: 47.9 mL/min (by C-G formula based on SCr of 0.98 mg/dL).    Allergies  Allergen Reactions   Amoxicillin-Pot Clavulanate Nausea And Vomiting   Cefdinir Nausea And Vomiting   Ciprofloxacin Hives and Itching   Other     Bioxin    Sulfa Antibiotics Hives and Itching    Antimicrobials this admission: Metronidazole 500mg  IV Q8H  6/9 >>   Dose adjustments this admission:  Microbiology results: 6/9 BCx: pending Resp panel (6/9) negative COVID-19 and influenza  Thank you for allowing pharmacy to be a part of this patient's care.  Jordan Hawks Deserie Dirks 08/14/2020 7:12 PM

## 2020-08-14 NOTE — ED Notes (Signed)
Patient stated she needed to go to bathroom.  Patient at this time is too weak to walk - she could barely get up from wheelchair to stretcher.  Purwick placed.  Patient stated she was still attempting to go to bathroom - "she had to adjust"

## 2020-08-14 NOTE — Progress Notes (Signed)
   08/14/20 1851  Assess: MEWS Score  Temp (!) 102.8 F (39.3 C)  BP (!) 116/56  Pulse Rate 75  Resp (!) 22  SpO2 95 %  O2 Device Room Air  Assess: MEWS Score  MEWS Temp 2  MEWS Systolic 0  MEWS Pulse 0  MEWS RR 1  MEWS LOC 0  MEWS Score 3  MEWS Score Color Yellow  Treat  Pain Scale 0-10  Pain Score 10  Pain Type Acute pain  Pain Location Leg  Take Vital Signs  Increase Vital Sign Frequency  Yellow: Q 2hr X 2 then Q 4hr X 2, if remains yellow, continue Q 4hrs  Notify: Charge Nurse/RN  Name of Charge Nurse/RN Notified Jacques Navy, RN  Date Charge Nurse/RN Notified 08/14/20  Time Charge Nurse/RN Notified [place]  Notify: Provider  Provider Name/Title Dr. Waldron Labs  Date Provider Notified 08/14/20  Time Provider Notified [place]  Notification Type Page  Notification Reason Change in status  Provider response See new orders  Date of Provider Response 08/14/20  Time of Provider Response 1858

## 2020-08-14 NOTE — ED Triage Notes (Signed)
Pt c/o weakness with N/V/D for the last 10 days.

## 2020-08-15 DIAGNOSIS — E872 Acidosis: Secondary | ICD-10-CM | POA: Diagnosis present

## 2020-08-15 DIAGNOSIS — Z8249 Family history of ischemic heart disease and other diseases of the circulatory system: Secondary | ICD-10-CM | POA: Diagnosis not present

## 2020-08-15 DIAGNOSIS — R6510 Systemic inflammatory response syndrome (SIRS) of non-infectious origin without acute organ dysfunction: Secondary | ICD-10-CM

## 2020-08-15 DIAGNOSIS — J90 Pleural effusion, not elsewhere classified: Secondary | ICD-10-CM | POA: Diagnosis present

## 2020-08-15 DIAGNOSIS — J45909 Unspecified asthma, uncomplicated: Secondary | ICD-10-CM | POA: Diagnosis present

## 2020-08-15 DIAGNOSIS — G20 Parkinson's disease: Secondary | ICD-10-CM | POA: Diagnosis present

## 2020-08-15 DIAGNOSIS — E876 Hypokalemia: Secondary | ICD-10-CM | POA: Diagnosis present

## 2020-08-15 DIAGNOSIS — Z85118 Personal history of other malignant neoplasm of bronchus and lung: Secondary | ICD-10-CM | POA: Diagnosis not present

## 2020-08-15 DIAGNOSIS — D6959 Other secondary thrombocytopenia: Secondary | ICD-10-CM | POA: Diagnosis present

## 2020-08-15 DIAGNOSIS — R4182 Altered mental status, unspecified: Secondary | ICD-10-CM | POA: Diagnosis not present

## 2020-08-15 DIAGNOSIS — R0603 Acute respiratory distress: Secondary | ICD-10-CM | POA: Diagnosis not present

## 2020-08-15 DIAGNOSIS — Z87891 Personal history of nicotine dependence: Secondary | ICD-10-CM | POA: Diagnosis not present

## 2020-08-15 DIAGNOSIS — G9341 Metabolic encephalopathy: Secondary | ICD-10-CM | POA: Diagnosis present

## 2020-08-15 DIAGNOSIS — Z833 Family history of diabetes mellitus: Secondary | ICD-10-CM | POA: Diagnosis not present

## 2020-08-15 DIAGNOSIS — E871 Hypo-osmolality and hyponatremia: Secondary | ICD-10-CM | POA: Diagnosis present

## 2020-08-15 DIAGNOSIS — F32A Depression, unspecified: Secondary | ICD-10-CM | POA: Diagnosis present

## 2020-08-15 DIAGNOSIS — I10 Essential (primary) hypertension: Secondary | ICD-10-CM | POA: Diagnosis present

## 2020-08-15 DIAGNOSIS — Z902 Acquired absence of lung [part of]: Secondary | ICD-10-CM | POA: Diagnosis not present

## 2020-08-15 DIAGNOSIS — Z20822 Contact with and (suspected) exposure to covid-19: Secondary | ICD-10-CM | POA: Diagnosis present

## 2020-08-15 DIAGNOSIS — N179 Acute kidney failure, unspecified: Secondary | ICD-10-CM | POA: Diagnosis present

## 2020-08-15 DIAGNOSIS — E860 Dehydration: Secondary | ICD-10-CM | POA: Diagnosis present

## 2020-08-15 DIAGNOSIS — D72818 Other decreased white blood cell count: Secondary | ICD-10-CM | POA: Diagnosis present

## 2020-08-15 DIAGNOSIS — K219 Gastro-esophageal reflux disease without esophagitis: Secondary | ICD-10-CM | POA: Diagnosis present

## 2020-08-15 DIAGNOSIS — F411 Generalized anxiety disorder: Secondary | ICD-10-CM | POA: Diagnosis present

## 2020-08-15 DIAGNOSIS — E785 Hyperlipidemia, unspecified: Secondary | ICD-10-CM | POA: Diagnosis present

## 2020-08-15 LAB — CBC
HCT: 34.2 % — ABNORMAL LOW (ref 36.0–46.0)
Hemoglobin: 11.5 g/dL — ABNORMAL LOW (ref 12.0–15.0)
MCH: 30.7 pg (ref 26.0–34.0)
MCHC: 33.6 g/dL (ref 30.0–36.0)
MCV: 91.2 fL (ref 80.0–100.0)
Platelets: 53 10*3/uL — ABNORMAL LOW (ref 150–400)
RBC: 3.75 MIL/uL — ABNORMAL LOW (ref 3.87–5.11)
RDW: 14.1 % (ref 11.5–15.5)
WBC: 2.5 10*3/uL — ABNORMAL LOW (ref 4.0–10.5)
nRBC: 0 % (ref 0.0–0.2)

## 2020-08-15 LAB — BASIC METABOLIC PANEL
Anion gap: 7 (ref 5–15)
BUN: 18 mg/dL (ref 8–23)
CO2: 19 mmol/L — ABNORMAL LOW (ref 22–32)
Calcium: 7.3 mg/dL — ABNORMAL LOW (ref 8.9–10.3)
Chloride: 103 mmol/L (ref 98–111)
Creatinine, Ser: 0.9 mg/dL (ref 0.44–1.00)
GFR, Estimated: >60 mL/min (ref >60)
Glucose, Bld: 88 mg/dL (ref 70–99)
Potassium: 3.5 mmol/L (ref 3.5–5.1)
Sodium: 129 mmol/L — ABNORMAL LOW (ref 135–145)

## 2020-08-15 LAB — PROCALCITONIN: Procalcitonin: 4.34 ng/mL

## 2020-08-15 LAB — HEPATIC FUNCTION PANEL
ALT: 14 U/L (ref 0–44)
AST: 174 U/L — ABNORMAL HIGH (ref 15–41)
Albumin: 2.7 g/dL — ABNORMAL LOW (ref 3.5–5.0)
Alkaline Phosphatase: 90 U/L (ref 38–126)
Bilirubin, Direct: 0.2 mg/dL (ref 0.0–0.2)
Indirect Bilirubin: 0.7 mg/dL (ref 0.3–0.9)
Total Bilirubin: 0.9 mg/dL (ref 0.3–1.2)
Total Protein: 5.1 g/dL — ABNORMAL LOW (ref 6.5–8.1)

## 2020-08-15 LAB — HEPATITIS PANEL, ACUTE
HCV Ab: NONREACTIVE
Hep A IgM: NONREACTIVE
Hep B C IgM: NONREACTIVE
Hepatitis B Surface Ag: NONREACTIVE

## 2020-08-15 LAB — PROTIME-INR
INR: 1.1 (ref 0.8–1.2)
Prothrombin Time: 14.6 seconds (ref 11.4–15.2)

## 2020-08-15 LAB — APTT: aPTT: 46 seconds — ABNORMAL HIGH (ref 24–36)

## 2020-08-15 MED ORDER — SACCHAROMYCES BOULARDII 250 MG PO CAPS
250.0000 mg | ORAL_CAPSULE | Freq: Two times a day (BID) | ORAL | Status: DC
  Administered 2020-08-15 – 2020-08-25 (×15): 250 mg via ORAL
  Filled 2020-08-15 (×19): qty 1

## 2020-08-15 MED ORDER — ENSURE ENLIVE PO LIQD
237.0000 mL | Freq: Three times a day (TID) | ORAL | Status: DC
  Administered 2020-08-15 – 2020-08-24 (×10): 237 mL via ORAL

## 2020-08-15 NOTE — Progress Notes (Signed)
   08/15/20 1113  Assess: MEWS Score  Temp 99 F (37.2 C)  BP (!) 93/49  Pulse Rate 79  Resp (!) 29  SpO2 92 %  Assess: MEWS Score  MEWS Temp 0  MEWS Systolic 1  MEWS Pulse 0  MEWS RR 2  MEWS LOC 0  MEWS Score 3  MEWS Score Color Yellow  Assess: if the MEWS score is Yellow or Red  Were vital signs taken at a resting state? Yes  Focused Assessment Change from prior assessment (see assessment flowsheet)  Early Detection of Sepsis Score *See Row Information* High  MEWS guidelines implemented *See Row Information* Yes  Treat  MEWS Interventions Escalated (See documentation below)  Pain Scale 0-10  Pain Score 0  Take Vital Signs  Increase Vital Sign Frequency  Yellow: Q 2hr X 2 then Q 4hr X 2, if remains yellow, continue Q 4hrs  Escalate  MEWS: Escalate Yellow: discuss with charge nurse/RN and consider discussing with provider and RRT  Notify: Charge Nurse/RN  Name of Charge Nurse/RN Notified Marine scientist  Date Charge Nurse/RN Notified 08/15/20  Time Charge Nurse/RN Notified 1120  Notify: Provider  Provider Name/Title Dyann Kief MD  Date Provider Notified 08/15/20  Time Provider Notified 1125  Notification Type  (Message)

## 2020-08-15 NOTE — Plan of Care (Signed)
  Problem: Acute Rehab PT Goals(only PT should resolve) Goal: Pt Will Go Supine/Side To Sit Outcome: Progressing Flowsheets (Taken 08/15/2020 1054) Pt will go Supine/Side to Sit: with modified independence Goal: Patient Will Transfer Sit To/From Stand Outcome: Progressing Flowsheets (Taken 08/15/2020 1054) Patient will transfer sit to/from stand: with supervision Goal: Pt Will Transfer Bed To Chair/Chair To Bed Outcome: Progressing Flowsheets (Taken 08/15/2020 1054) Pt will Transfer Bed to Chair/Chair to Bed: with supervision Goal: Pt Will Ambulate Outcome: Progressing Flowsheets (Taken 08/15/2020 1054) Pt will Ambulate:  100 feet  with min guard assist  with supervision  with rolling walker   10:55 AM, 08/15/20 Lonell Grandchild, MPT Physical Therapist with [facility] 336 [phone] office [phone] mobile phone

## 2020-08-15 NOTE — Progress Notes (Signed)
PROGRESS NOTE    Deborah [name]  OEU:[id number] DOB: [birth year] DOA: 08/14/2020 PCP: Loman Brooklyn, FNP   Chief Complaint  Patient presents with   Weakness    Brief admission narrative:  As per H&P written by Dr. Waldron Labs on 08/15/2018    Deborah [name]  is a 75 y.o. female, with past medical history of Parkinson, depression, anxiety, GERD and hypertension, patient presents to ED secondary to multiple complaints, including nausea, vomiting, weakness and diarrhea, reports symptoms started 2 weeks ago after they ate in a steak place, reports both she and her husband he felt sick, but got better in few days, but her symptoms has persisted for last 2 weeks, reports nausea, poor appetite, initially some vomiting but none recently, still reports diarrhea 2-3 loose bowel movements per day, which prompted her to come to ED for fever, chills, blood in stools, coffee-ground emesis, no chest pain or shortness of breath. -In ED her work-up significant for sodium of 125, potassium of 3.1, AST of 164, bicarb of 19, white blood cell count of 2, and platelet count of 62,000, [facility] consulted to admit.  Assessment & Plan: 1-SIRS -Presenting with vomiting, diarrhea, nausea and dehydration -Spiking high-grade temperature -Elevated procalcitonin -Continue broad-spectrum antibiotics while awaiting culture results -Continue IV fluids and supportive care -Florastor has been added -Follow GI panel and C. diff PCR  2-hyponatremia/hypokalemia -In the setting of GI losses -Continue IV fluids electrolyte repletion -I will place orders to follow metabolic panel in the a.m.  3-GERD (gastroesophageal reflux disease) -continue PPI for now -if C. Diff positive, will discontinue and use Famotidine.  4-Depression and Generalized anxiety disorder -stable mood -continue home meds  5-Parkinson disease ([place]) -continue Sinemet    DVT prophylaxis: SCDs Code Status: Full code Family Communication:  Husband at bedside. Disposition:   Status is: Inpatient  The patient will require care spanning > 2 midnights and should be moved to inpatient because: IV treatments appropriate due to intensity of illness or inability to take PO  Dispo: The patient is from: Home              Anticipated d/c is to: Home with home health services.              Patient currently is not medically stable to d/c.  Spiking high-grade temperature; elevated procalcitonin level and with ongoing abnormal electrolytes and leukopenia.  Continue empirical treatment for early sepsis.   Difficult to place patient No       Consultants:  None  Procedures:  See below for x-ray reports.  Antimicrobials:  Vancomycin, cefepime and Flagyl.  Subjective: Chronically ill in appearance; experiencing multiple episodes of diarrhea and also having high-grade temperature.  No nausea or vomiting.  Objective: Vitals:   08/15/20 1113 08/15/20 1124 08/15/20 1241 08/15/20 1310  BP: (!) 93/49   (!) 104/49  Pulse: 79   70  Resp: (!) 29 (!) 26 20 19   Temp: 99 F (37.2 C)   97.9 F (36.6 C)  TempSrc: Oral   Oral  SpO2: 92%   97%  Weight:      Height:        Intake/Output Summary (Last 24 hours) at 08/15/2020 1559 Last data filed at 08/15/2020 1009 Gross per 24 hour  Intake 1227.2 ml  Output 1 ml  Net 1226.2 ml   Filed Weights   08/14/20 0949 08/14/20 1851  Weight: 55.3 kg 61.4 kg    Examination:  General exam: Appears calm and comfortable  tendon examination; patient was resting after receiving anxiolytic medications.  Per husband at bedside multiple episodes of diarrhea has been reported.  High-grade temperature overnight.  Good oxygen saturation on room air. Respiratory system: No wheezing, no crackles, no using accessory muscles. Cardiovascular system: S1 and S2; no rubs, no gallops, no JVD. Gastrointestinal system: Abdomen is nondistended, soft and without guarding.  No organomegaly or masses felt. Normal bowel  sounds heard. Central nervous system: No focal neurological deficits. Extremities: No cyanosis or clubbing. Skin: No petechiae. Psychiatry: Mood & affect appropriate.     Data Reviewed: I have personally reviewed following labs and imaging studies  CBC: Recent Labs  Lab 08/14/20 1107 08/14/20 1734 08/15/20 0620  WBC 2.0* 1.9* 2.5*  NEUTROABS 1.6* 1.5*  --   HGB 12.5 11.1* 11.5*  HCT 36.6 32.7* 34.2*  MCV 92.4 91.9 91.2  PLT 62* 54* 53*    Basic Metabolic Panel: Recent Labs  Lab 08/14/20 1107 08/15/20 0620  NA 125* 129*  K 3.1* 3.5  CL 98 103  CO2 19* 19*  GLUCOSE 111* 88  BUN 24* 18  CREATININE 0.98 0.90  CALCIUM 8.1* 7.3*    GFR: Estimated Creatinine Clearance: 52.1 mL/min (by C-G formula based on SCr of 0.9 mg/dL).  Liver Function Tests: Recent Labs  Lab 08/14/20 1107 08/15/20 0620  AST 165* 174*  ALT 24 14  ALKPHOS 82 90  BILITOT 0.9 0.9  PROT 6.0* 5.1*  ALBUMIN 3.1* 2.7*    CBG: No results for input(s): GLUCAP in the last 168 hours.   Recent Results (from the past 240 hour(s))  Resp Panel by RT-PCR (Flu A&B, Covid) Nasopharyngeal Swab     Status: None   Collection Time: 08/14/20  2:22 PM   Specimen: Nasopharyngeal Swab; Nasopharyngeal(NP) swabs in vial transport medium  Result Value Ref Range Status   SARS Coronavirus 2 by RT PCR NEGATIVE NEGATIVE Final    Comment: (NOTE) SARS-CoV-2 target nucleic acids are NOT DETECTED.  The SARS-CoV-2 RNA is generally detectable in upper respiratory specimens during the acute phase of infection. The lowest concentration of SARS-CoV-2 viral copies this assay can detect is 138 copies/mL. A negative result does not preclude SARS-Cov-2 infection and should not be used as the sole basis for treatment or other patient management decisions. A negative result may occur with  improper specimen collection/handling, submission of specimen other than nasopharyngeal swab, presence of viral mutation(s) within  the areas targeted by this assay, and inadequate number of viral copies(<138 copies/mL). A negative result must be combined with clinical observations, patient history, and epidemiological information. The expected result is Negative.  Fact Sheet for Patients:  EntrepreneurPulse.com.au  Fact Sheet for Healthcare Providers:  IncredibleEmployment.be  This test is no t yet approved or cleared by the [place] FDA and  has been authorized for detection and/or diagnosis of SARS-CoV-2 by FDA under an Emergency Use Authorization (EUA). This EUA will remain  in effect (meaning this test can be used) for the duration of the COVID-19 declaration under Section 564(b)(1) of the Act, 21 U.S.C.section 360bbb-3(b)(1), unless the authorization is terminated  or revoked sooner.       Influenza A by PCR NEGATIVE NEGATIVE Final   Influenza B by PCR NEGATIVE NEGATIVE Final    Comment: (NOTE) The Xpert Xpress SARS-CoV-2/FLU/RSV plus assay is intended as an aid in the diagnosis of influenza from Nasopharyngeal swab specimens and should not be used as a sole basis for treatment. Nasal washings and aspirates are unacceptable  for Xpert Xpress SARS-CoV-2/FLU/RSV testing.  Fact Sheet for Patients: EntrepreneurPulse.com.au  Fact Sheet for Healthcare Providers: IncredibleEmployment.be  This test is not yet approved or cleared by the [place] FDA and has been authorized for detection and/or diagnosis of SARS-CoV-2 by FDA under an Emergency Use Authorization (EUA). This EUA will remain in effect (meaning this test can be used) for the duration of the COVID-19 declaration under Section 564(b)(1) of the Act, 21 U.S.C. section 360bbb-3(b)(1), unless the authorization is terminated or revoked.  Performed at [facility], [address], [place], [place] [address]   Culture, blood (x 2)     Status: None (Preliminary result)    Collection Time: 08/14/20  8:29 PM   Specimen: BLOOD RIGHT HAND  Result Value Ref Range Status   Specimen Description BLOOD RIGHT HAND  Final   Special Requests   Final    BOTTLES DRAWN AEROBIC AND ANAEROBIC Blood Culture adequate volume   Culture   Final    NO GROWTH < 12 HOURS Performed at [facility], [address], [place], [place] [address]    Report Status PENDING  Incomplete  Culture, blood (x 2)     Status: None (Preliminary result)   Collection Time: 08/14/20  8:39 PM   Specimen: BLOOD LEFT HAND  Result Value Ref Range Status   Specimen Description BLOOD LEFT HAND  Final   Special Requests   Final    BOTTLES DRAWN AEROBIC AND ANAEROBIC Blood Culture adequate volume   Culture   Final    NO GROWTH < 12 HOURS Performed at [facility], [address] [place], [place] [address]    Report Status PENDING  Incomplete     Radiology Studies: US Abdomen Complete  Result Date: 08/14/2020 CLINICAL DATA:  Nausea, vomiting, and diarrhea for 10 days, thrombocytopenia EXAM: ABDOMEN ULTRASOUND COMPLETE COMPARISON:  None FINDINGS: Gallbladder: Normally distended without stones or wall thickening. No pericholecystic fluid or sonographic Murphy sign. Common bile duct: Diameter: 3 mm, normal Liver: Normal echogenicity without mass or nodularity. Portal vein is patent on color Doppler imaging with normal direction of blood flow towards the liver. IVC: Normal appearance Pancreas: Normal appearance Spleen: Multiple calcified granulomata within spleen. Spleen 9.6 cm length. Right Kidney: Length: 10.4 cm. Normal cortical thickness and echogenicity. Tiny exophytic cyst 9 x 7 x 8 mm at mid kidney. No additional mass or hydronephrosis. Left Kidney: Length: 11.3 cm. Normal morphology without mass or hydronephrosis. Abdominal aorta: Normal caliber with mild atherosclerotic plaque formation Other findings: Small BILATERAL pleural effusions. Trace perihepatic free fluid. IMPRESSION: Calcified granulomata within  spleen. Tiny RIGHT renal cyst 9 mm diameter. Small BILATERAL pleural effusions and trace perihepatic free fluid. Electronically Signed   By: Lavonia Dana M.D.   On: 08/14/2020 17:27   CT CHEST ABDOMEN PELVIS W CONTRAST  Result Date: 08/15/2020 CLINICAL DATA:  Sepsis, leukopenia EXAM: CT CHEST, ABDOMEN, AND PELVIS WITH CONTRAST TECHNIQUE: Multidetector CT imaging of the chest, abdomen and pelvis was performed following the standard protocol during bolus administration of intravenous contrast. CONTRAST:  175mL OMNIPAQUE IOHEXOL 300 MG/ML  SOLN COMPARISON:  None. FINDINGS: CT CHEST FINDINGS Cardiovascular: Mild multi-vessel coronary artery calcification. Global cardiac size within normal limits. Trace pericardial effusion. Central pulmonary arteries are of normal caliber. Mild atherosclerotic calcification within the thoracic aorta. No aortic aneurysm. Mediastinum/Nodes: No enlarged mediastinal, hilar, or axillary lymph nodes. Thyroid gland, trachea, and esophagus demonstrate no significant findings. Lungs/Pleura: Small right and trace left pleural effusions are present. Trace interstitial  pulmonary edema. Mild left basilar atelectasis. Resection of the superior segment of the right lower lobe has been performed. No pneumothorax. No central obstructing mass identified. Musculoskeletal: No chest wall mass or suspicious bone lesions identified. CT ABDOMEN PELVIS FINDINGS Hepatobiliary: Tiny probable cyst within the inferior right hepatic lobe. Mild periportal edema. Liver otherwise unremarkable. No intra or extrahepatic biliary ductal dilation. Gallbladder unremarkable. Pancreas: Unremarkable Spleen: Benign calcified granuloma are seen throughout the spleen. The spleen is otherwise unremarkable. Adrenals/Urinary Tract: Adrenal glands are unremarkable. There is mild bilateral hydronephrosis and marked distension of the bladder suggesting changes of bladder outlet obstruction or voluntary retention. Normal cortical  enhancement the kidneys bilaterally. No intrarenal or ureteral calculi are identified. Stomach/Bowel: Mild scattered sigmoid diverticulosis. The stomach, small bowel, and large bowel are otherwise unremarkable. Appendix normal. Mild ascites. No free intraperitoneal gas. Vascular/Lymphatic: There is mild atherosclerotic calcification within the abdominal aorta. 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 There is no significant extrinsic compression upon the left renal vein and left common iliac vein to suggest that this represents a vascular collateral. The abdominal vasculature is otherwise unremarkable. There is no pathologic adenopathy within the abdomen and pelvis. Reproductive: Uterus and bilateral adnexa are unremarkable. Other: Small fat containing umbilical hernia. Musculoskeletal: No acute bone abnormality. No lytic or blastic bone lesion is identified. IMPRESSION: No definite source identified for the patient's reported sepsis. Anasarca with mild interstitial pulmonary edema, bilateral pleural effusions, periportal edema and mild ascites. Mild multi-vessel coronary artery calcification. Mild bilateral hydronephrosis and marked distension of the bladder possibly reflecting changes of voluntary retention or bladder outlet obstruction. 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 Aortic Atherosclerosis (ICD10-I70.0). Electronically Signed   By: Fidela Salisbury MD   On: 08/15/2020 02:00     Scheduled Meds:  carbidopa-levodopa  2 tablet Oral TID   clonazePAM  0.5 mg Oral Daily   escitalopram  20 mg Oral Q1400   feeding supplement  237 mL Oral TID BM   pantoprazole  40 mg Oral Daily   saccharomyces boulardii  250 mg Oral BID   Continuous Infusions:  0.9 % NaCl with KCl 20 mEq / L 75 mL/hr at 08/15/20  1225   ceFEPime (MAXIPIME) IV 2 g (08/15/20 0924)   metronidazole 500 mg (08/15/20 1241)   vancomycin       LOS: 0 days    Time spent: 35 minutes    Barton Dubois, MD [facility]   To contact the attending provider between 7A-7P or the covering provider during after hours 7P-7A, please log into the web site [email or web address] and access using universal [place] password for that web site. If you do not have the password, please call the hospital operator.  08/15/2020, 3:59 PM

## 2020-08-15 NOTE — TOC Progression Note (Signed)
Transition of Care (TOC) - Progression Note    Patient Details  Name: Deborah [name] MRN: [id number] Date of Birth: December 13, 1945  Transition of Care [facility]) CM/SW Contact  Natasha Bence, LCSW Phone Number: 08/15/2020, 4:04 PM  Clinical Narrative:    CSW notified of patient's need for [facility] and RW. CSW referred patient to [facility] with [facility]. Vaughan Basta agreeable to provide services to patient. CSW also referred patient to [facility] for RW. Zack with [facility] agreeable to provide RW. TOC to follow.        Expected Discharge Plan and Services                                                 Social Determinants of Health (SDOH) Interventions    Readmission Risk Interventions No flowsheet data found.

## 2020-08-15 NOTE — Progress Notes (Signed)
   08/15/20 1124  Assess: MEWS Score  Resp (!) 26  Assess: MEWS Score  MEWS Temp 0  MEWS Systolic 1  MEWS Pulse 0  MEWS RR 2  MEWS LOC 0  MEWS Score 3  MEWS Score Color Yellow  Treat  MEWS Interventions Escalated (See documentation below)  Pain Scale 0-10  Pain Score 0  Notify: Provider  Provider Sherrian Divers MD  Date Provider Notified 08/15/20  Time Provider Notified 1137  Notification Type Page  Notification Reason Change in status (Yellow MEWS T-99, BP-93/49, R-26)  Provider response In department  Date of Provider Response 08/15/20  Time of Provider Response [id number]  Document  Patient Outcome Not stable and remains on department (provider concerned about possible sepsis, orders to be placed per conversation dr Dyann Kief had with assigned nurse Eustace Pen, [place])  Progress note created (see row info) Yes

## 2020-08-15 NOTE — Evaluation (Signed)
Physical Therapy Evaluation Patient Details Name: Deborah [name]. Deborah [name] MRN: [id number] DOB: 07-31-1945 Today's Date: 08/15/2020   History of Present Illness  Deborah [name]  is a 75 y.o. female, with past medical history of Parkinson, depression, anxiety, GERD and hypertension, patient presents to ED secondary to multiple complaints, including nausea, vomiting, weakness and diarrhea, reports symptoms started 2 weeks ago after they ate in a steak place, reports both she and her husband he felt sick, but got better in few days, but her symptoms has persisted for last 2 weeks, reports nausea, poor appetite, initially some vomiting but none recently, still reports diarrhea 2-3 loose bowel movements per day, which prompted her to come to ED for fever, chills, blood in stools, coffee-ground emesis, no chest pain or shortness of breath.  -In ED her work-up significant for sodium of 125, potassium of 3.1, AST of 164, bicarb of 19, white blood cell count of 2, and platelet count of 62,000, [facility]ist consulted to admit.   Clinical Impression  Patient demonstrates slow labored movement for sitting up at bedside requiring Min guard assist to scoot to edge of bed, unable to maintain standing balance without AD, required use of RW for safety and demonstrates slow labored cadence with occasional bumping into nearby objects during ambulation, limited mostly due to fatigue.  Patient tolerated sitting up in chair after therapy with her spouse present in room.  Patient's orthostatic BP's as follows, lying 105/50, sitting 104/49, and standing 102/54 - nursing staff notified.  Patient will benefit from continued physical therapy in hospital and recommended venue below to increase strength, balance, endurance for safe ADLs and gait.       Follow Up Recommendations Home health PT;Supervision for mobility/OOB;Supervision - Intermittent    Equipment Recommendations  Rolling walker with 5" wheels    Recommendations for  Other Services       Precautions / Restrictions Precautions Precautions: Fall Restrictions Weight Bearing Restrictions: No      Mobility  Bed Mobility Overal bed mobility: Needs Assistance Bed Mobility: Supine to Sit     Supine to sit: Min guard;Min assist     General bed mobility comments: increased time, labored movement    Transfers Overall transfer level: Needs assistance Equipment used: Rolling walker (2 wheeled) Transfers: Sit to/from [facility] Sit to Stand: Min assist Stand pivot transfers: Min assist       General transfer comment: increased time, labored movement, unable to maintain standing balance without AD, required use of RW for safety  Ambulation/Gait Ambulation/Gait assistance: Min assist Gait Distance (Feet): 65 Feet Assistive device: Rolling walker (2 wheeled) Gait Pattern/deviations: Decreased step length - right;Decreased step length - left;Decreased stride length Gait velocity: decreased   General Gait Details: slow labored cadence with occasional bumping into nearby objects without loss of balance, limited mostly due to fatigue, RLE pain at baseline during ambulation  Stairs            Wheelchair Mobility    Modified Rankin (Stroke Patients Only)       Balance Overall balance assessment: Needs assistance Sitting-balance support: Feet supported;No upper extremity supported Sitting balance-Leahy Scale: Fair Sitting balance - Comments: fair/good seated at EOB   Standing balance support: During functional activity;No upper extremity supported Standing balance-Leahy Scale: Poor Standing balance comment: fair using RW                             Pertinent Vitals/Pain Pain Assessment:  Faces Faces Pain Scale: Hurts little more Pain Location: right anterior thigh Pain Descriptors / Indicators: Sore Pain Intervention(s): Limited activity within patient's tolerance;Monitored during session    Home  Living Family/patient expects to be discharged to:: Private residence Living Arrangements: Spouse/significant other Available Help at Discharge: Family;Available 24 hours/day Type of Home: House Home Access: Stairs to enter Entrance Stairs-Rails: Right;Left;Can reach both Entrance Stairs-Number of Steps: 4 Home Layout: One level Home Equipment: Cane - single point      Prior Function Level of Independence: Independent         Comments: Communnity ambulator, drives     Hand Dominance        Extremity/Trunk Assessment   Upper Extremity Assessment Upper Extremity Assessment: Generalized weakness    Lower Extremity Assessment Lower Extremity Assessment: Generalized weakness    Cervical / Trunk Assessment Cervical / Trunk Assessment: Normal  Communication   Communication: No difficulties  Cognition Arousal/Alertness: Awake/alert Behavior During Therapy: WFL for tasks assessed/performed Overall Cognitive Status: Within Functional Limits for tasks assessed                                        General Comments      Exercises     Assessment/Plan    PT Assessment Patient needs continued PT services  PT Problem List Decreased strength;Decreased activity tolerance;Decreased balance;Decreased mobility       PT Treatment Interventions DME instruction;Gait training;Stair training;Functional mobility training;Therapeutic activities;Therapeutic exercise;Balance training;Patient/family education    PT Goals (Current goals can be found in the Care Plan section)  Acute Rehab PT Goals Patient Stated Goal: return home with family to assist PT Goal Formulation: With patient/family Time For Goal Achievement: 08/19/20 Potential to Achieve Goals: Good    Frequency Min 3X/week   Barriers to discharge        Co-evaluation               AM-PAC PT "6 Clicks" Mobility  Outcome Measure Help needed turning from your back to your side while in a flat  bed without using bedrails?: None Help needed moving from lying on your back to sitting on the side of a flat bed without using bedrails?: A Little Help needed moving to and from a bed to a chair (including a wheelchair)?: A Little Help needed standing up from a chair using your arms (e.g., wheelchair or bedside chair)?: A Little Help needed to walk in hospital room?: A Little Help needed climbing 3-5 steps with a railing? : A Lot 6 Click Score: 18    End of Session   Activity Tolerance: Patient tolerated treatment well;Patient limited by fatigue Patient left: in chair;with call bell/phone within reach Nurse Communication: Mobility status PT Visit Diagnosis: Unsteadiness on feet (R26.81);Other abnormalities of gait and mobility (R26.89);Muscle weakness (generalized) (M62.81)    Time: 2878-6767 PT Time Calculation (min) (ACUTE ONLY): 21 min   Charges:   PT Evaluation $PT Eval Moderate Complexity: 1 Mod PT Treatments $Therapeutic Activity: 8-22 mins        10:53 AM, 08/15/20 Lonell Grandchild, MPT Physical Therapist with [facility] [address] [phone] office [phone] mobile phone

## 2020-08-16 LAB — BASIC METABOLIC PANEL
Anion gap: 9 (ref 5–15)
BUN: 15 mg/dL (ref 8–23)
CO2: 16 mmol/L — ABNORMAL LOW (ref 22–32)
Calcium: 6.9 mg/dL — ABNORMAL LOW (ref 8.9–10.3)
Chloride: 103 mmol/L (ref 98–111)
Creatinine, Ser: 0.75 mg/dL (ref 0.44–1.00)
GFR, Estimated: >60 mL/min (ref >60)
Glucose, Bld: 85 mg/dL (ref 70–99)
Potassium: 3.2 mmol/L — ABNORMAL LOW (ref 3.5–5.1)
Sodium: 128 mmol/L — ABNORMAL LOW (ref 135–145)

## 2020-08-16 LAB — PROCALCITONIN: Procalcitonin: 3.43 ng/mL

## 2020-08-16 LAB — CBC
HCT: 36.5 % (ref 36.0–46.0)
Hemoglobin: 12.3 g/dL (ref 12.0–15.0)
MCH: 30.6 pg (ref 26.0–34.0)
MCHC: 33.7 g/dL (ref 30.0–36.0)
MCV: 90.8 fL (ref 80.0–100.0)
Platelets: 45 10*3/uL — ABNORMAL LOW (ref 150–400)
RBC: 4.02 MIL/uL (ref 3.87–5.11)
RDW: 14.4 % (ref 11.5–15.5)
WBC: 3.8 10*3/uL — ABNORMAL LOW (ref 4.0–10.5)
nRBC: 0 % (ref 0.0–0.2)

## 2020-08-16 LAB — GASTROINTESTINAL PANEL BY PCR, STOOL (REPLACES STOOL CULTURE)

## 2020-08-16 LAB — C DIFFICILE QUICK SCREEN W PCR REFLEX
C Diff antigen: NEGATIVE
C Diff interpretation: NOT DETECTED
C Diff toxin: NEGATIVE

## 2020-08-16 MED ORDER — POTASSIUM CHLORIDE 20 MEQ PO PACK
40.0000 meq | PACK | Freq: Once | ORAL | Status: AC
  Administered 2020-08-16: 40 meq via ORAL
  Filled 2020-08-16: qty 2

## 2020-08-16 MED ORDER — LOPERAMIDE HCL 2 MG PO CAPS
2.0000 mg | ORAL_CAPSULE | ORAL | Status: DC | PRN
  Administered 2020-08-16 – 2020-08-17 (×2): 2 mg via ORAL
  Filled 2020-08-16 (×2): qty 1

## 2020-08-16 NOTE — Progress Notes (Signed)
PROGRESS NOTE  Deborah [name]. Hiltunen DGL:[id number] DOB: 1945-07-12 DOA: 08/14/2020 PCP: Loman Brooklyn, FNP  HPI/Recap of past 24 hours: Patient seen and examined at bedside husband is at bedside nurse reported patient had 2 bouts of diarrhea this morning  Assessment/Plan: Active Problems:   GERD (gastroesophageal reflux disease)   Generalized anxiety disorder   Parkinson disease ([place])   Hyponatremia   SIRS (systemic inflammatory response syndrome) ([place])   1.  SIRS continue IV antibiotics Continue current antibiotics with cefepime vancomycin and Flagyl  2.  Hyponatremia/hypokalemia Likely secondary to GI loss Continue IV fluid replacement and electrolyte replacement  3.  Diarrhea.  Continued with diarrhea we will check for C. difficile If C. difficile is positive there we will discontinue PPI and try famotidine  4.  Parkinson's disease continue Sinemet.  Code Status: Full  Severity of Illness: The appropriate patient status for this patient is INPATIENT. Inpatient status is judged to be reasonable and necessary in order to provide the required intensity of service to ensure the patient's safety. The patient's presenting symptoms, physical exam findings, and initial radiographic and laboratory data in the context of their chronic comorbidities is felt to place them at high risk for further clinical deterioration. Furthermore, it is not anticipated that the patient will be medically stable for discharge from the hospital within 2 midnights of admission. The following factors support the patient status of inpatient.  Needs antibiotic by IV   * I certify that at the point of admission it is my clinical judgment that the patient will require inpatient hospital care spanning beyond 2 midnights from the point of admission due to high intensity of service, high risk for further deterioration and high frequency of surveillance required.*   Family Communication: Husband Doug at  bedside  Disposition Plan: Home once   Consultants: Continue  Procedures: None  Antimicrobials: Cefepime and Vancomycin Metronidazole  DVT prophylaxis: SCD   Objective: Vitals:   08/16/20 1002 08/16/20 1215 08/16/20 1234 08/16/20 1536  BP: 131/62 (!) 91/48 105/63 (!) 110/56  Pulse: 96 91 94 97  Resp:  20  19  Temp: 98 F (36.7 C) 99.5 F (37.5 C) 98 F (36.7 C) 98.2 F (36.8 C)  TempSrc: Oral Oral Oral Oral  SpO2: 98% 94% 96% 96%  Weight:      Height:        Intake/Output Summary (Last 24 hours) at 08/16/2020 1708 Last data filed at 08/16/2020 1518 Gross per 24 hour  Intake 2947.59 ml  Output --  Net 2947.59 ml   Filed Weights   08/14/20 0949 08/14/20 1851  Weight: 55.3 kg 61.4 kg   Body mass index is 21.85 kg/m.  Exam:  General: 75 y.o. year-old female well developed well nourished in no acute distress.  Alert and oriented x3. Cardiovascular: Regular rate and rhythm with no rubs or gallops.  No thyromegaly or JVD noted.   Respiratory: Clear to auscultation with no wheezes or rales. Good inspiratory effort. Abdomen: Soft nontender nondistended with normal bowel sounds x4 quadrants. Musculoskeletal: No lower extremity edema. 2/4 pulses in all 4 extremities. Skin: No ulcerative lesions noted or rashes, Psychiatry: Mood is appropriate for condition and setting    Data Reviewed: CBC: Recent Labs  Lab 08/14/20 1107 08/14/20 1734 08/15/20 0620 08/16/20 0600  WBC 2.0* 1.9* 2.5* 3.8*  NEUTROABS 1.6* 1.5*  --   --   HGB 12.5 11.1* 11.5* 12.3  HCT 36.6 32.7* 34.2* 36.5  MCV 92.4 91.9 91.2 90.8  PLT 62* 54* 53* 45*   Basic Metabolic Panel: Recent Labs  Lab 08/14/20 1107 08/15/20 0620 08/16/20 0600  NA 125* 129* 128*  K 3.1* 3.5 3.2*  CL 98 103 103  CO2 19* 19* 16*  GLUCOSE 111* 88 85  BUN 24* 18 15  CREATININE 0.98 0.90 0.75  CALCIUM 8.1* 7.3* 6.9*   GFR: Estimated Creatinine Clearance: 58.6 mL/min (by C-G formula based on SCr of 0.75  mg/dL). Liver Function Tests: Recent Labs  Lab 08/14/20 1107 08/15/20 0620  AST 165* 174*  ALT 24 14  ALKPHOS 82 90  BILITOT 0.9 0.9  PROT 6.0* 5.1*  ALBUMIN 3.1* 2.7*   No results for input(s): LIPASE, AMYLASE in the last 168 hours. No results for input(s): AMMONIA in the last 168 hours. Coagulation Profile: Recent Labs  Lab 08/15/20 0620  INR 1.1   Cardiac Enzymes: No results for input(s): CKTOTAL, CKMB, CKMBINDEX, TROPONINI in the last 168 hours. BNP (last 3 results) No results for input(s): PROBNP in the last 8760 hours. HbA1C: No results for input(s): HGBA1C in the last 72 hours. CBG: No results for input(s): GLUCAP in the last 168 hours. Lipid Profile: No results for input(s): CHOL, HDL, LDLCALC, TRIG, CHOLHDL, LDLDIRECT in the last 72 hours. Thyroid Function Tests: No results for input(s): TSH, T4TOTAL, FREET4, T3FREE, THYROIDAB in the last 72 hours. Anemia Panel: No results for input(s): VITAMINB12, FOLATE, FERRITIN, TIBC, IRON, RETICCTPCT in the last 72 hours. Urine analysis:    Component Value Date/Time   COLORURINE YELLOW 08/14/2020 1108   APPEARANCEUR CLEAR 08/14/2020 1108   LABSPEC 1.015 08/14/2020 1108   PHURINE 6.0 08/14/2020 1108   GLUCOSEU NEGATIVE 08/14/2020 1108   HGBUR SMALL (A) 08/14/2020 1108   BILIRUBINUR NEGATIVE 08/14/2020 1108   KETONESUR 5 (A) 08/14/2020 1108   PROTEINUR 100 (A) 08/14/2020 1108   NITRITE NEGATIVE 08/14/2020 1108   LEUKOCYTESUR NEGATIVE 08/14/2020 1108   Sepsis Labs: @LABRCNTIP (procalcitonin:4,lacticidven:4)  ) Recent Results (from the past 240 hour(s))  Resp Panel by RT-PCR (Flu A&B, Covid) Nasopharyngeal Swab     Status: None   Collection Time: 08/14/20  2:22 PM   Specimen: Nasopharyngeal Swab; Nasopharyngeal(NP) swabs in vial transport medium  Result Value Ref Range Status   SARS Coronavirus 2 by RT PCR NEGATIVE NEGATIVE Final    Comment: (NOTE) SARS-CoV-2 target nucleic acids are NOT DETECTED.  The  SARS-CoV-2 RNA is generally detectable in upper respiratory specimens during the acute phase of infection. The lowest concentration of SARS-CoV-2 viral copies this assay can detect is 138 copies/mL. A negative result does not preclude SARS-Cov-2 infection and should not be used as the sole basis for treatment or other patient management decisions. A negative result may occur with  improper specimen collection/handling, submission of specimen other than nasopharyngeal swab, presence of viral mutation(s) within the areas targeted by this assay, and inadequate number of viral copies(<138 copies/mL). A negative result must be combined with clinical observations, patient history, and epidemiological information. The expected result is Negative.  Fact Sheet for Patients:  EntrepreneurPulse.com.au  Fact Sheet for Healthcare Providers:  IncredibleEmployment.be  This test is no t yet approved or cleared by the [place] FDA and  has been authorized for detection and/or diagnosis of SARS-CoV-2 by FDA under an Emergency Use Authorization (EUA). This EUA will remain  in effect (meaning this test can be used) for the duration of the COVID-19 declaration under Section 564(b)(1) of the Act, 21 U.S.C.section 360bbb-3(b)(1), unless the authorization is terminated  or revoked sooner.       Influenza A by PCR NEGATIVE NEGATIVE Final   Influenza B by PCR NEGATIVE NEGATIVE Final    Comment: (NOTE) The Xpert Xpress SARS-CoV-2/FLU/RSV plus assay is intended as an aid in the diagnosis of influenza from Nasopharyngeal swab specimens and should not be used as a sole basis for treatment. Nasal washings and aspirates are unacceptable for Xpert Xpress SARS-CoV-2/FLU/RSV testing.  Fact Sheet for Patients: EntrepreneurPulse.com.au  Fact Sheet for Healthcare Providers: IncredibleEmployment.be  This test is not yet approved or  cleared by the [place] FDA and has been authorized for detection and/or diagnosis of SARS-CoV-2 by FDA under an Emergency Use Authorization (EUA). This EUA will remain in effect (meaning this test can be used) for the duration of the COVID-19 declaration under Section 564(b)(1) of the Act, 21 U.S.C. section 360bbb-3(b)(1), unless the authorization is terminated or revoked.  Performed at [facility], [address] [place], [place] [address]   Gastrointestinal Panel by PCR , Stool     Status: None   Collection Time: 08/14/20  4:04 PM   Specimen: Stool  Result Value Ref Range Status   Campylobacter species NOT DETECTED NOT DETECTED Final   Plesimonas shigelloides NOT DETECTED NOT DETECTED Final   Salmonella species NOT DETECTED NOT DETECTED Final   Yersinia enterocolitica NOT DETECTED NOT DETECTED Final   Vibrio species NOT DETECTED NOT DETECTED Final   Vibrio cholerae NOT DETECTED NOT DETECTED Final   Enteroaggregative E coli (EAEC) NOT DETECTED NOT DETECTED Final   Enteropathogenic E coli (EPEC) NOT DETECTED NOT DETECTED Final   Enterotoxigenic E coli (ETEC) NOT DETECTED NOT DETECTED Final   Shiga like toxin producing E coli (STEC) NOT DETECTED NOT DETECTED Final   Shigella/Enteroinvasive E coli (EIEC) NOT DETECTED NOT DETECTED Final   Cryptosporidium NOT DETECTED NOT DETECTED Final   Cyclospora cayetanensis NOT DETECTED NOT DETECTED Final   Entamoeba histolytica NOT DETECTED NOT DETECTED Final   Giardia lamblia NOT DETECTED NOT DETECTED Final   Adenovirus F40/41 NOT DETECTED NOT DETECTED Final   Astrovirus NOT DETECTED NOT DETECTED Final   Norovirus GI/GII NOT DETECTED NOT DETECTED Final   Rotavirus A NOT DETECTED NOT DETECTED Final   Sapovirus (I, II, IV, and V) NOT DETECTED NOT DETECTED Final    Comment: Performed at [facility], [address] [place], [place] [address]  Culture, blood (x 2)     Status: None (Preliminary result)   Collection Time: 08/14/20   8:29 PM   Specimen: BLOOD RIGHT HAND  Result Value Ref Range Status   Specimen Description BLOOD RIGHT HAND  Final   Special Requests   Final    BOTTLES DRAWN AEROBIC AND ANAEROBIC Blood Culture adequate volume   Culture   Final    NO GROWTH 2 DAYS Performed at [facility], [address] [place], [place] [address]    Report Status PENDING  Incomplete  Culture, blood (x 2)     Status: None (Preliminary result)   Collection Time: 08/14/20  8:39 PM   Specimen: BLOOD LEFT HAND  Result Value Ref Range Status   Specimen Description BLOOD LEFT HAND  Final   Special Requests   Final    BOTTLES DRAWN AEROBIC AND ANAEROBIC Blood Culture adequate volume   Culture   Final    NO GROWTH 2 DAYS Performed at [facility], [address], [place], [place] [address]    Report Status PENDING  Incomplete  Studies: No results found.  Scheduled Meds:  carbidopa-levodopa  2 tablet Oral TID   clonazePAM  0.5 mg Oral Daily   escitalopram  20 mg Oral Q1400   feeding supplement  237 mL Oral TID BM   pantoprazole  40 mg Oral Daily   saccharomyces boulardii  250 mg Oral BID    Continuous Infusions:  0.9 % NaCl with KCl 20 mEq / L 100 mL/hr at 08/16/20 1518   ceFEPime (MAXIPIME) IV Stopped (08/16/20 1031)   metronidazole Stopped (08/16/20 1345)   vancomycin 1,000 mg (08/15/20 2100)     LOS: 1 day     Cristal Deer, MD [facility]  To reach me or the doctor on call, go to: [email or web address] Password [facility]  08/16/2020, 5:08 PM

## 2020-08-17 DIAGNOSIS — R338 Other retention of urine: Secondary | ICD-10-CM

## 2020-08-17 LAB — CBC WITH DIFFERENTIAL/PLATELET
Band Neutrophils: 20 %
Basophils Absolute: 0 10*3/uL (ref 0.0–0.1)
Basophils Relative: 0 %
Eosinophils Absolute: 0 10*3/uL (ref 0.0–0.5)
Eosinophils Relative: 0 %
HCT: 38.7 % (ref 36.0–46.0)
Hemoglobin: 13.8 g/dL (ref 12.0–15.0)
Lymphocytes Relative: 5 %
Lymphs Abs: 0.2 10*3/uL — ABNORMAL LOW (ref 0.7–4.0)
MCH: 31.4 pg (ref 26.0–34.0)
MCHC: 35.7 g/dL (ref 30.0–36.0)
MCV: 88 fL (ref 80.0–100.0)
Metamyelocytes Relative: 4 %
Monocytes Absolute: 0.1 10*3/uL (ref 0.1–1.0)
Monocytes Relative: 2 %
Neutro Abs: 4.1 10*3/uL (ref 1.7–7.7)
Neutrophils Relative %: 69 %
Platelets: 49 10*3/uL — ABNORMAL LOW (ref 150–400)
RBC: 4.4 MIL/uL (ref 3.87–5.11)
RDW: 14.8 % (ref 11.5–15.5)
WBC: 4.6 10*3/uL (ref 4.0–10.5)
nRBC: 0 % (ref 0.0–0.2)

## 2020-08-17 LAB — BASIC METABOLIC PANEL
Anion gap: 6 (ref 5–15)
BUN: 28 mg/dL — ABNORMAL HIGH (ref 8–23)
CO2: 13 mmol/L — ABNORMAL LOW (ref 22–32)
Calcium: 6.5 mg/dL — ABNORMAL LOW (ref 8.9–10.3)
Chloride: 107 mmol/L (ref 98–111)
Creatinine, Ser: 1.22 mg/dL — ABNORMAL HIGH (ref 0.44–1.00)
GFR, Estimated: 47 mL/min — ABNORMAL LOW (ref >60)
Glucose, Bld: 101 mg/dL — ABNORMAL HIGH (ref 70–99)
Potassium: 4.3 mmol/L (ref 3.5–5.1)
Sodium: 126 mmol/L — ABNORMAL LOW (ref 135–145)

## 2020-08-17 LAB — MAGNESIUM: Magnesium: 1.7 mg/dL (ref 1.7–2.4)

## 2020-08-17 MED ORDER — PHENOL 1.4 % MT LIQD
1.0000 | OROMUCOSAL | Status: DC | PRN
  Administered 2020-08-17 – 2020-08-18 (×2): 1 via OROMUCOSAL
  Filled 2020-08-17: qty 177

## 2020-08-17 MED ORDER — POTASSIUM CHLORIDE 20 MEQ PO PACK
20.0000 meq | PACK | Freq: Once | ORAL | Status: AC
  Administered 2020-08-17: 20 meq via ORAL
  Filled 2020-08-17: qty 1

## 2020-08-17 NOTE — Progress Notes (Signed)
[facility] night shift.  The nursing staff reported earlier the patient's bladder scan was greater than 999 mL.  Her heart rate is 123, respiratory rate 24 and BP 115/59 mmHg.  An In-N-Out cath was performed and more than 1800 mL of urine were drained.  Her vital signs after that have improved.  Her most recent HR is 103, RR is 20 and her blood pressure is 99/52 mmHg.  She looks more comfortable now.  The nursing staff will continue to monitor with the bladder scan.  Tennis Must, MD.

## 2020-08-17 NOTE — Progress Notes (Signed)
2000 Paged MD: Pt assessed and abdominal distention noted. Vitals are a MEWS yellow due to HR and respirations. Bladder scan resulted in urine retention >999. MD notified of findings. Order to I&O pt completed. Urine output 1800cc. MEWS protocol recheck at 2200.

## 2020-08-17 NOTE — Progress Notes (Signed)
Pharmacy Antibiotic Note  Deborah [name]. Moga is a 75 y.o. female admitted on 08/14/2020 with sepsis and intra-abdominal infection .  Pharmacy has been consulted for cefepime and vancomycin dosing.  Plan: Vancomycin 1000 mg IV every 24 hours. Cefepime 2000 mg IV every 12 hours. Monitor labs, c/s, and vanco level as indicated. Monitor for de-escalation.  Height: 5\' 6"  (167.6 cm) Weight: 61.4 kg (135 lb 5.8 oz) IBW/kg (Calculated) : 59.3  Temp (24hrs), Avg:98.4 F (36.9 C), Min:97.8 F (36.6 C), Max:99.5 F (37.5 C)  Recent Labs  Lab 08/14/20 1107 08/14/20 1734 08/14/20 2030 08/15/20 0620 08/16/20 0600  WBC 2.0* 1.9*  --  2.5* 3.8*  CREATININE 0.98  --   --  0.90 0.75  LATICACIDVEN  --   --  2.4*  --   --      Estimated Creatinine Clearance: 58.6 mL/min (by C-G formula based on SCr of 0.75 mg/dL).    Allergies  Allergen Reactions   Amoxicillin-Pot Clavulanate Nausea And Vomiting   Cefdinir Nausea And Vomiting   Ciprofloxacin Hives and Itching   Other     Bioxin    Sulfa Antibiotics Hives and Itching    Antimicrobials this admission:  Cefepime 6/9 >> Vanco 6/9 >> Flagyl 6/9 >>  6/9 Bcx: ngtd  Thank you for allowing pharmacy to be a part of this patient's care.  Ramond Craver 08/17/2020 9:10 AM

## 2020-08-17 NOTE — Progress Notes (Signed)
Physical Therapy Treatment Patient Details Name: Deborah Thede. [name] MRN: [id number] DOB: 05-13-1945 Today's Date: 08/17/2020    History of Present Illness Deborah [name]  is a 75 y.o. female, with past medical history of Parkinson, depression, anxiety, GERD and hypertension, patient presents to ED secondary to multiple complaints, including nausea, vomiting, weakness and diarrhea, reports symptoms started 2 weeks ago after they ate in a steak place, reports both she and her husband he felt sick, but got better in few days, but her symptoms has persisted for last 2 weeks, reports nausea, poor appetite, initially some vomiting but none recently, still reports diarrhea 2-3 loose bowel movements per day, which prompted her to come to ED for fever, chills, blood in stools, coffee-ground emesis, no chest pain or shortness of breath.  -In ED her work-up significant for sodium of 125, potassium of 3.1, AST of 164, bicarb of 19, white blood cell count of 2, and platelet count of 62,000, [facility] hospitalist consulted to admit.    PT Comments    Patient demonstrates slow labored movement for sitting up at bedside, once seated, tends to lean backwards requiring verbal/tactile cueing to hold onto to knees to maintain sitting balance, very unsteady on feet and limited to a few short slow labored side steps at bedside to transfer to [facility] and later chair.  Patient tolerated sitting up in chair after therapy with her spouse present in room after therapy.  Patient will benefit from continued physical therapy in hospital and recommended venue below to increase strength, balance, endurance for safe ADLs and gait.      Follow Up Recommendations  SNF     Equipment Recommendations  Rolling walker with 5" wheels    Recommendations for Other Services       Precautions / Restrictions Precautions Precautions: Fall Restrictions Weight Bearing Restrictions: No    Mobility  Bed Mobility Overal bed mobility: Needs  Assistance Bed Mobility: Supine to Sit     Supine to sit: Mod assist     General bed mobility comments: increased time, labored movement    Transfers Overall transfer level: Needs assistance Equipment used: Rolling walker (2 wheeled) Transfers: Sit to/from [facility] Sit to Stand: Mod assist Stand pivot transfers: Mod assist       General transfer comment: very unsteady on feet, labored movement  Ambulation/Gait Ambulation/Gait assistance: Mod assist Gait Distance (Feet): 5 Feet Assistive device: Rolling walker (2 wheeled) Gait Pattern/deviations: Decreased step length - right;Decreased step length - left;Decreased stride length;Shuffle Gait velocity: decreased   General Gait Details: limited to 5-6 slow labored shuffling side steps due to weakness and c/o fatigue   Stairs             Wheelchair Mobility    Modified Rankin (Stroke Patients Only)       Balance Overall balance assessment: Needs assistance Sitting-balance support: Feet supported;No upper extremity supported Sitting balance-Leahy Scale: Poor Sitting balance - Comments: fair/poor unsupported, fair supporting self with BUE Postural control: Posterior lean Standing balance support: During functional activity;No upper extremity supported Standing balance-Leahy Scale: Poor Standing balance comment: fair/poor using RW                            Cognition Arousal/Alertness: Awake/alert Behavior During Therapy: WFL for tasks assessed/performed Overall Cognitive Status: Within Functional Limits for tasks assessed  Exercises General Exercises - Lower Extremity Long Arc Quad: Seated;AROM;Strengthening;Both;5 reps Hip Flexion/Marching: Seated;AROM;Strengthening;Both;5 reps Toe Raises: Seated;AROM;Strengthening;Both;10 reps Heel Raises: Seated;AROM;Strengthening;Both;10 reps    General Comments        Pertinent  Vitals/Pain Pain Assessment: Faces Faces Pain Scale: Hurts little more Pain Location: stomach Pain Descriptors / Indicators: Aching;Discomfort Pain Intervention(s): Limited activity within patient's tolerance;Monitored during session;Patient requesting pain meds-RN notified    Home Living                      Prior Function            PT Goals (current goals can now be found in the care plan section) Acute Rehab PT Goals Patient Stated Goal: return home with family to assist PT Goal Formulation: With patient/family Time For Goal Achievement: 08/29/20 Potential to Achieve Goals: Good Progress towards PT goals: Progressing toward goals    Frequency    Min 3X/week      PT Plan Discharge plan needs to be updated    Co-evaluation              AM-PAC PT "6 Clicks" Mobility   Outcome Measure  Help needed turning from your back to your side while in a flat bed without using bedrails?: A Lot Help needed moving from lying on your back to sitting on the side of a flat bed without using bedrails?: A Lot Help needed moving to and from a bed to a chair (including a wheelchair)?: A Lot Help needed standing up from a chair using your arms (e.g., wheelchair or bedside chair)?: A Lot Help needed to walk in hospital room?: A Lot Help needed climbing 3-5 steps with a railing? : Total 6 Click Score: 11    End of Session   Activity Tolerance: Patient tolerated treatment well;Patient limited by fatigue Patient left: in chair;with call bell/phone within reach;with family/visitor present Nurse Communication: Mobility status PT Visit Diagnosis: Unsteadiness on feet (R26.81);Other abnormalities of gait and mobility (R26.89);Muscle weakness (generalized) (M62.81)     Time: 1638-4665 PT Time Calculation (min) (ACUTE ONLY): 31 min  Charges:  $Therapeutic Exercise: 8-22 mins $Therapeutic Activity: 8-22 mins                     11:23 AM, 08/17/20 Lonell Grandchild,  MPT Physical Therapist with [facility] 336 [phone] office [phone] mobile phone

## 2020-08-17 NOTE — Progress Notes (Signed)
PROGRESS NOTE  Deborah [name]. Pendergraft LMB:[id number] DOB: 07-31-1945 DOA: 08/14/2020 PCP: Loman Brooklyn, FNP  HPI/Recap of past 24 hours: Patient seen and examined at bedside husband is at bedside nurse reported patient had 2 bouts of diarrhea this morning  Update: 04/19/2020: Patient seen and examined at bedside with her husband at bedside he stated that she ate a little bit better today mashed potatoes and macaroni and cheese.  Her blood pressure is in the low 100 patient husband stated that she is usually around 100 so that this is baseline for.  I advised him that her GI panel was negative C. difficile was negative blood culture was negative x3.  Her potassium however was 3.2 and will queen to replete. She had a physical therapy evaluation and they recommend SNF her husband is agreeable.  Staff will consult for SNF placement  Assessment/Plan: Active Problems:   GERD (gastroesophageal reflux disease)   Generalized anxiety disorder   Parkinson disease ([place])   Hyponatremia   SIRS (systemic inflammatory response syndrome) ([place])   1.  SIRS continue IV antibiotics Continue current antibiotics with cefepime vancomycin and Flagyl  2.  Hyponatremia/hypokalemia Likely secondary to GI loss Continue IV fluid replacement and electrolyte replacement  3.  Diarrhea.  Continued with diarrhea we will check for C. difficile If C. difficile is positive there we will discontinue PPI and try famotidine C. difficile was negative, GI cultures were also negative  4.  Parkinson's disease continue Sinemet.  Code Status: Full  Severity of Illness: The appropriate patient status for this patient is INPATIENT. Inpatient status is judged to be reasonable and necessary in order to provide the required intensity of service to ensure the patient's safety. The patient's presenting symptoms, physical exam findings, and initial radiographic and laboratory data in the context of their chronic comorbidities is felt  to place them at high risk for further clinical deterioration. Furthermore, it is not anticipated that the patient will be medically stable for discharge from the hospital within 2 midnights of admission. The following factors support the patient status of inpatient.  Needs antibiotic by IV   * I certify that at the point of admission it is my clinical judgment that the patient will require inpatient hospital care spanning beyond 2 midnights from the point of admission due to high intensity of service, high risk for further deterioration and high frequency of surveillance required.*   Family Communication: Husband Doug at bedside  Disposition Plan: For nursing home placement   Consultants: Continue  Procedures: None  Antimicrobials: Cefepime and Vancomycin Metronidazole  DVT prophylaxis: SCD   Objective: Vitals:   08/16/20 2036 08/17/20 0357 08/17/20 0600 08/17/20 1315  BP: 123/63 130/66 108/65 (!) 103/58  Pulse: (!) 102 84 74 (!) 106  Resp: 20 20 20 20   Temp: 97.8 F (36.6 C) 98.9 F (37.2 C) 98.3 F (36.8 C) 97.8 F (36.6 C)  TempSrc:    Oral  SpO2: 100% 91% 99% 97%  Weight:      Height:        Intake/Output Summary (Last 24 hours) at 08/17/2020 1505 Last data filed at 08/17/2020 1322 Gross per 24 hour  Intake 1945.93 ml  Output --  Net 1945.93 ml    Filed Weights   08/14/20 0949 08/14/20 1851  Weight: 55.3 kg 61.4 kg   Body mass index is 21.85 kg/m.  Exam:  General: 75 y.o. year-old female well developed well nourished in no acute distress.  Alert and oriented x3.  Cardiovascular: Regular rate and rhythm with no rubs or gallops.  No thyromegaly or JVD noted.   Respiratory: Clear to auscultation with no wheezes or rales. Good inspiratory effort. Abdomen: Soft nontender nondistended with normal bowel sounds x4 quadrants. Musculoskeletal: No lower extremity edema. 2/4 pulses in all 4 extremities. Skin: No ulcerative lesions noted or rashes, Psychiatry:  Mood is appropriate for condition and setting    Data Reviewed: CBC: Recent Labs  Lab 08/14/20 1107 08/14/20 1734 08/15/20 0620 08/16/20 0600  WBC 2.0* 1.9* 2.5* 3.8*  NEUTROABS 1.6* 1.5*  --   --   HGB 12.5 11.1* 11.5* 12.3  HCT 36.6 32.7* 34.2* 36.5  MCV 92.4 91.9 91.2 90.8  PLT 62* 54* 53* 45*    Basic Metabolic Panel: Recent Labs  Lab 08/14/20 1107 08/15/20 0620 08/16/20 0600  NA 125* 129* 128*  K 3.1* 3.5 3.2*  CL 98 103 103  CO2 19* 19* 16*  GLUCOSE 111* 88 85  BUN 24* 18 15  CREATININE 0.98 0.90 0.75  CALCIUM 8.1* 7.3* 6.9*    GFR: Estimated Creatinine Clearance: 58.6 mL/min (by C-G formula based on SCr of 0.75 mg/dL). Liver Function Tests: Recent Labs  Lab 08/14/20 1107 08/15/20 0620  AST 165* 174*  ALT 24 14  ALKPHOS 82 90  BILITOT 0.9 0.9  PROT 6.0* 5.1*  ALBUMIN 3.1* 2.7*    No results for input(s): LIPASE, AMYLASE in the last 168 hours. No results for input(s): AMMONIA in the last 168 hours. Coagulation Profile: Recent Labs  Lab 08/15/20 0620  INR 1.1    Cardiac Enzymes: No results for input(s): CKTOTAL, CKMB, CKMBINDEX, TROPONINI in the last 168 hours. BNP (last 3 results) No results for input(s): PROBNP in the last 8760 hours. HbA1C: No results for input(s): HGBA1C in the last 72 hours. CBG: No results for input(s): GLUCAP in the last 168 hours. Lipid Profile: No results for input(s): CHOL, HDL, LDLCALC, TRIG, CHOLHDL, LDLDIRECT in the last 72 hours. Thyroid Function Tests: No results for input(s): TSH, T4TOTAL, FREET4, T3FREE, THYROIDAB in the last 72 hours. Anemia Panel: No results for input(s): VITAMINB12, FOLATE, FERRITIN, TIBC, IRON, RETICCTPCT in the last 72 hours. Urine analysis:    Component Value Date/Time   COLORURINE YELLOW 08/14/2020 1108   APPEARANCEUR CLEAR 08/14/2020 1108   LABSPEC 1.015 08/14/2020 1108   PHURINE 6.0 08/14/2020 1108   GLUCOSEU NEGATIVE 08/14/2020 1108   HGBUR SMALL (A) 08/14/2020 1108    BILIRUBINUR NEGATIVE 08/14/2020 1108   KETONESUR 5 (A) 08/14/2020 1108   PROTEINUR 100 (A) 08/14/2020 1108   NITRITE NEGATIVE 08/14/2020 1108   LEUKOCYTESUR NEGATIVE 08/14/2020 1108   Sepsis Labs: @LABRCNTIP (procalcitonin:4,lacticidven:4)  ) Recent Results (from the past 240 hour(s))  Resp Panel by RT-PCR (Flu A&B, Covid) Nasopharyngeal Swab     Status: None   Collection Time: 08/14/20  2:22 PM   Specimen: Nasopharyngeal Swab; Nasopharyngeal(NP) swabs in vial transport medium  Result Value Ref Range Status   SARS Coronavirus 2 by RT PCR NEGATIVE NEGATIVE Final    Comment: (NOTE) SARS-CoV-2 target nucleic acids are NOT DETECTED.  The SARS-CoV-2 RNA is generally detectable in upper respiratory specimens during the acute phase of infection. The lowest concentration of SARS-CoV-2 viral copies this assay can detect is 138 copies/mL. A negative result does not preclude SARS-Cov-2 infection and should not be used as the sole basis for treatment or other patient management decisions. A negative result may occur with  improper specimen collection/handling, submission of specimen other than  nasopharyngeal swab, presence of viral mutation(s) within the areas targeted by this assay, and inadequate number of viral copies(<138 copies/mL). A negative result must be combined with clinical observations, patient history, and epidemiological information. The expected result is Negative.  Fact Sheet for Patients:  EntrepreneurPulse.com.au  Fact Sheet for Healthcare Providers:  IncredibleEmployment.be  This test is no t yet approved or cleared by the [place] FDA and  has been authorized for detection and/or diagnosis of SARS-CoV-2 by FDA under an Emergency Use Authorization (EUA). This EUA will remain  in effect (meaning this test can be used) for the duration of the COVID-19 declaration under Section 564(b)(1) of the Act, 21 U.S.C.section  360bbb-3(b)(1), unless the authorization is terminated  or revoked sooner.       Influenza A by PCR NEGATIVE NEGATIVE Final   Influenza B by PCR NEGATIVE NEGATIVE Final    Comment: (NOTE) The Xpert Xpress SARS-CoV-2/FLU/RSV plus assay is intended as an aid in the diagnosis of influenza from Nasopharyngeal swab specimens and should not be used as a sole basis for treatment. Nasal washings and aspirates are unacceptable for Xpert Xpress SARS-CoV-2/FLU/RSV testing.  Fact Sheet for Patients: EntrepreneurPulse.com.au  Fact Sheet for Healthcare Providers: IncredibleEmployment.be  This test is not yet approved or cleared by the [place] FDA and has been authorized for detection and/or diagnosis of SARS-CoV-2 by FDA under an Emergency Use Authorization (EUA). This EUA will remain in effect (meaning this test can be used) for the duration of the COVID-19 declaration under Section 564(b)(1) of the Act, 21 U.S.C. section 360bbb-3(b)(1), unless the authorization is terminated or revoked.  Performed at [facility], [address], [place], [place] [address]   Gastrointestinal Panel by PCR , Stool     Status: None   Collection Time: 08/14/20  4:04 PM   Specimen: Stool  Result Value Ref Range Status   Campylobacter species NOT DETECTED NOT DETECTED Final   Plesimonas shigelloides NOT DETECTED NOT DETECTED Final   Salmonella species NOT DETECTED NOT DETECTED Final   Yersinia enterocolitica NOT DETECTED NOT DETECTED Final   Vibrio species NOT DETECTED NOT DETECTED Final   Vibrio cholerae NOT DETECTED NOT DETECTED Final   Enteroaggregative E coli (EAEC) NOT DETECTED NOT DETECTED Final   Enteropathogenic E coli (EPEC) NOT DETECTED NOT DETECTED Final   Enterotoxigenic E coli (ETEC) NOT DETECTED NOT DETECTED Final   Shiga like toxin producing E coli (STEC) NOT DETECTED NOT DETECTED Final   Shigella/Enteroinvasive E coli (EIEC) NOT DETECTED NOT DETECTED  Final   Cryptosporidium NOT DETECTED NOT DETECTED Final   Cyclospora cayetanensis NOT DETECTED NOT DETECTED Final   Entamoeba histolytica NOT DETECTED NOT DETECTED Final   Giardia lamblia NOT DETECTED NOT DETECTED Final   Adenovirus F40/41 NOT DETECTED NOT DETECTED Final   Astrovirus NOT DETECTED NOT DETECTED Final   Norovirus GI/GII NOT DETECTED NOT DETECTED Final   Rotavirus A NOT DETECTED NOT DETECTED Final   Sapovirus (I, II, IV, and V) NOT DETECTED NOT DETECTED Final    Comment: Performed at [facility], [address] [place], [place] [address]  Culture, blood (x 2)     Status: None (Preliminary result)   Collection Time: 08/14/20  8:29 PM   Specimen: BLOOD RIGHT HAND  Result Value Ref Range Status   Specimen Description BLOOD RIGHT HAND  Final   Special Requests   Final    BOTTLES DRAWN AEROBIC AND ANAEROBIC Blood Culture adequate volume   Culture   Final  NO GROWTH 3 DAYS Performed at [facility], [address] [place], [place] [address]    Report Status PENDING  Incomplete  Culture, blood (x 2)     Status: None (Preliminary result)   Collection Time: 08/14/20  8:39 PM   Specimen: BLOOD LEFT HAND  Result Value Ref Range Status   Specimen Description BLOOD LEFT HAND  Final   Special Requests   Final    BOTTLES DRAWN AEROBIC AND ANAEROBIC Blood Culture adequate volume   Culture   Final    NO GROWTH 3 DAYS Performed at [facility], [address] [place], [place] [address]    Report Status PENDING  Incomplete  C Difficile Quick Screen w PCR reflex     Status: None   Collection Time: 08/16/20 10:00 PM   Specimen: STOOL  Result Value Ref Range Status   C Diff antigen NEGATIVE NEGATIVE Final   C Diff toxin NEGATIVE NEGATIVE Final   C Diff interpretation No C. difficile detected.  Final    Comment: Performed at [facility], [address], [place], [place] [address]      Studies: No results found.  Scheduled Meds:  carbidopa-levodopa  2 tablet Oral  TID   clonazePAM  0.5 mg Oral Daily   escitalopram  20 mg Oral Q1400   feeding supplement  237 mL Oral TID BM   pantoprazole  40 mg Oral Daily   saccharomyces boulardii  250 mg Oral BID    Continuous Infusions:  0.9 % NaCl with KCl 20 mEq / L 100 mL/hr at 08/17/20 0016   ceFEPime (MAXIPIME) IV 2 g (08/17/20 0947)   metronidazole 500 mg (08/17/20 1159)   vancomycin 1,000 mg (08/16/20 2024)     LOS: 2 days     Cristal Deer, MD [facility]  To reach me or the doctor on call, go to: [email or web address] Password [facility]  08/17/2020, 3:05 PM

## 2020-08-18 ENCOUNTER — Inpatient Hospital Stay (HOSPITAL_COMMUNITY): Payer: Medicare Other

## 2020-08-18 LAB — CBC WITH DIFFERENTIAL/PLATELET
Abs Immature Granulocytes: 0.08 10*3/uL — ABNORMAL HIGH (ref 0.00–0.07)
Basophils Absolute: 0.1 10*3/uL (ref 0.0–0.1)
Basophils Relative: 1 %
Eosinophils Absolute: 0 10*3/uL (ref 0.0–0.5)
Eosinophils Relative: 0 %
HCT: 40.8 % (ref 36.0–46.0)
Hemoglobin: 13.8 g/dL (ref 12.0–15.0)
Immature Granulocytes: 1 %
Lymphocytes Relative: 7 %
Lymphs Abs: 0.5 10*3/uL — ABNORMAL LOW (ref 0.7–4.0)
MCH: 30.9 pg (ref 26.0–34.0)
MCHC: 33.8 g/dL (ref 30.0–36.0)
MCV: 91.3 fL (ref 80.0–100.0)
Monocytes Absolute: 0.3 10*3/uL (ref 0.1–1.0)
Monocytes Relative: 4 %
Neutro Abs: 6.6 10*3/uL (ref 1.7–7.7)
Neutrophils Relative %: 87 %
Platelets: 46 10*3/uL — ABNORMAL LOW (ref 150–400)
RBC: 4.47 MIL/uL (ref 3.87–5.11)
RDW: 15.4 % (ref 11.5–15.5)
WBC: 7.5 10*3/uL (ref 4.0–10.5)
nRBC: 0 % (ref 0.0–0.2)

## 2020-08-18 LAB — BLOOD GAS, VENOUS
Acid-base deficit: 12.2 mmol/L — ABNORMAL HIGH (ref 0.0–2.0)
Bicarbonate: 15.3 mmol/L — ABNORMAL LOW (ref 20.0–28.0)
FIO2: 21
O2 Saturation: 65.7 %
Patient temperature: 36.8
pCO2, Ven: 20.7 mmHg — ABNORMAL LOW (ref 44.0–60.0)
pH, Ven: 7.381 (ref 7.250–7.430)
pO2, Ven: 35.8 mmHg (ref 32.0–45.0)

## 2020-08-18 LAB — BASIC METABOLIC PANEL
Anion gap: 6 (ref 5–15)
BUN: 34 mg/dL — ABNORMAL HIGH (ref 8–23)
CO2: 13 mmol/L — ABNORMAL LOW (ref 22–32)
Calcium: 6.2 mg/dL — CL (ref 8.9–10.3)
Chloride: 108 mmol/L (ref 98–111)
Creatinine, Ser: 1.5 mg/dL — ABNORMAL HIGH (ref 0.44–1.00)
GFR, Estimated: 37 mL/min — ABNORMAL LOW (ref >60)
Glucose, Bld: 83 mg/dL (ref 70–99)
Potassium: 5.4 mmol/L — ABNORMAL HIGH (ref 3.5–5.1)
Sodium: 127 mmol/L — ABNORMAL LOW (ref 135–145)

## 2020-08-18 LAB — PROCALCITONIN: Procalcitonin: 4.32 ng/mL

## 2020-08-18 MED ORDER — CHLORHEXIDINE GLUCONATE CLOTH 2 % EX PADS
6.0000 | MEDICATED_PAD | Freq: Every day | CUTANEOUS | Status: DC
  Administered 2020-08-18 – 2020-08-25 (×6): 6 via TOPICAL

## 2020-08-18 MED ORDER — SODIUM CHLORIDE 0.9 % IV SOLN: INTRAVENOUS | Status: DC

## 2020-08-18 MED ORDER — CALCIUM GLUCONATE-NACL 1-0.675 GM/50ML-% IV SOLN
1.0000 g | Freq: Once | INTRAVENOUS | Status: AC
  Administered 2020-08-18: 1000 mg via INTRAVENOUS
  Filled 2020-08-18: qty 50

## 2020-08-18 NOTE — TOC Initial Note (Signed)
Transition of Care (TOC) - Initial/Assessment Note    Patient Details  Name: Deborah [name]. [place] MRN: [id number] Date of Birth: [birth year]  Transition of Care [facility]) CM/SW Contact:    Iona Beard, [place] Phone Number: 08/18/2020, 12:06 PM  Clinical Narrative:                 CSW updated that PT is recommending SNF for pt. CSW spoke with pts husband who was in room with pt, they are agreeable to referral being sent out for SNF. CSW sent referral locally. Pts PASRR is [id number] E. CSW spoke with pts husband about bed offers, he would like to think about it tonight and let TOC know in the morning which facility they would like to go with. TOC to follow.   Expected Discharge Plan: Skilled Nursing Facility Barriers to Discharge: Continued Medical Work up   Patient Goals and CMS Choice Patient states their goals for this hospitalization and ongoing recovery are:: Go to SNF CMS Medicare.gov Compare Post Acute Care list provided to:: Patient Choice offered to / list presented to : Patient, Spouse  Expected Discharge Plan and Services Expected Discharge Plan: [place] In-house Referral: Clinical Social Work Discharge Planning Services: CM Consult Post Acute Care Choice: [place] arrangements for the past 2 months: Single Family Home                 DME Arranged: N/A DME Agency: NA       HH Arranged: NA [place] Agency: NA        Prior Living Arrangements/Services Living arrangements for the past 2 months: Single Family Home Lives with:: Spouse Patient language and need for interpreter reviewed:: Yes Do you feel safe going back to the place where you live?: Yes      Need for Family Participation in Patient Care: Yes (Comment) Care giver support system in place?: Yes (comment)   Criminal Activity/Legal Involvement Pertinent to Current Situation/Hospitalization: No - Comment as needed  Activities of Daily Living Home Assistive  Devices/Equipment: None ADL Screening (condition at time of admission) Patient's cognitive ability adequate to safely complete daily activities?: No Is the patient deaf or have difficulty hearing?: No Does the patient have difficulty seeing, even when wearing glasses/contacts?: No Does the patient have difficulty concentrating, remembering, or making decisions?: No Patient able to express need for assistance with ADLs?: Yes Does the patient have difficulty dressing or bathing?: No Independently performs ADLs?: Yes (appropriate for developmental age) Does the patient have difficulty walking or climbing stairs?: No Weakness of Legs: None Weakness of Arms/Hands: None  Permission Sought/Granted                  Emotional Assessment Appearance:: Appears stated age Attitude/Demeanor/Rapport: Engaged Affect (typically observed): Accepting Orientation: : Oriented to Self, Oriented to Place, Oriented to  Time, Oriented to Situation Alcohol / Substance Use: Not Applicable Psych Involvement: No (comment)  Admission diagnosis:  Dehydration [E86.0] Hypokalemia [E87.6] Hyponatremia [E87.1] Thrombocytopenia (HCC) [D69.6] SIRS (systemic inflammatory response syndrome) (HCC) [R65.10] Patient Active Problem List   Diagnosis Date Noted   SIRS (systemic inflammatory response syndrome) ([place]) 08/15/2020   Hyponatremia 08/14/2020   Seasonal allergies 07/28/2020   GERD (gastroesophageal reflux disease) 07/28/2020   Generalized anxiety disorder 07/28/2020   Parkinson disease ([place]) 07/28/2020   Tachycardia 07/28/2020   History of lung cancer 07/28/2020   PCP:  Loman Brooklyn, FNP Pharmacy:   [id number] - [place], [place]  [place] [address] Phone: [phone] Fax: [phone]     Social Determinants of Health (SDOH) Interventions    Readmission Risk Interventions Readmission Risk Prevention Plan 08/18/2020  Medication Screening  Complete  Transportation Screening Complete

## 2020-08-18 NOTE — Progress Notes (Signed)
Date and time results received: 08/18/20 0657 (use smartphrase ".now" to insert current time)  Test: Calcium Critical Value: 6.2  Name of Provider Notified: Manuella Ghazi  Orders Received? Or Actions Taken?: Awaiting response

## 2020-08-18 NOTE — Progress Notes (Signed)
PROGRESS NOTE    Deborah [name]. Deborah [name]  PYP:[id number] DOB: [birth year] DOA: 08/14/2020 PCP: Loman Brooklyn, FNP   Brief Narrative:   Per HPI: Deborah [name]  is a 75 y.o. female, with past medical history of Parkinson, depression, anxiety, GERD and hypertension, patient presents to ED secondary to multiple complaints, including nausea, vomiting, weakness and diarrhea, reports symptoms started 2 weeks ago after they ate in a steak place, reports both she and her husband he felt sick, but got better in few days, but her symptoms has persisted for last 2 weeks, reports nausea, poor appetite, initially some vomiting but none recently, still reports diarrhea 2-3 loose bowel movements per day, which prompted her to come to ED for fever, chills, blood in stools, coffee-ground emesis, no chest pain or shortness of breath.  Patient was admitted with SIRS criteria as well as electrolyte abnormalities.  Assessment & Plan:   Active Problems:   GERD (gastroesophageal reflux disease)   Generalized anxiety disorder   Parkinson disease (HCC)   Hyponatremia   SIRS (systemic inflammatory response syndrome) (HCC)   SIRS -Continue on cefepime and Flagyl only, discontinue vancomycin  [name] -Continue IV fluids -Likely related to some urinary retention and therefore could be postrenal -Follow a.m. labs  Hyponatremia/hyperkalemia/hypocalcemia -Replete and monitor -Likely related to GI loss  Diarrhea -Negative C. Difficile  Parkinsonism -Continue Sinemet  Thrombocytopenia -Currently stable, continue to monitor -Avoid heparin agents -Follow-up with hematology outpatient  DVT prophylaxis: SCDs Code Status: Full Family Communication: Husband, Doug at bedside 6/13 Disposition Plan:  Status is: Inpatient  Remains inpatient appropriate because:Persistent severe electrolyte disturbances, Altered mental status, Ongoing diagnostic testing needed not appropriate for outpatient work up, and IV treatments  appropriate due to intensity of illness or inability to take PO  Dispo: The patient is from: Home              Anticipated d/c is to: Home              Patient currently is not medically stable to d/c.   Difficult to place patient No   Consultants:  None  Procedures:  See below  Antimicrobials:  Anti-infectives (From admission, onward)    Start     Dose/Rate Route Frequency Ordered Stop   08/15/20 2000  vancomycin (VANCOCIN) IVPB 1000 mg/200 mL premix  Status:  Discontinued       See Hyperspace for full Linked Orders Report.   1,000 mg 200 mL/hr over 60 Minutes Intravenous Every 24 hours 08/14/20 1911 08/18/20 0705   08/14/20 2200  ceFEPIme (MAXIPIME) 2 g in sodium chloride 0.9 % 100 mL IVPB  Status:  Discontinued        2 g 200 mL/hr over 30 Minutes Intravenous Every 12 hours 08/14/20 1905 08/14/20 2008   08/14/20 2100  ceFEPIme (MAXIPIME) 2 g in sodium chloride 0.9 % 100 mL IVPB  Status:  Discontinued        2 g 200 mL/hr over 30 Minutes Intravenous Every 12 hours 08/14/20 2008 08/14/20 2015   08/14/20 2015  ceFEPIme (MAXIPIME) 2 g in sodium chloride 0.9 % 100 mL IVPB        2 g 200 mL/hr over 30 Minutes Intravenous Every 12 hours 08/14/20 2014     08/14/20 2000  metroNIDAZOLE (FLAGYL) IVPB 500 mg        500 mg 100 mL/hr over 60 Minutes Intravenous Every 8 hours 08/14/20 1900     08/14/20 2000  vancomycin (VANCOREADY) IVPB 1250 mg/250  mL       See Hyperspace for full Linked Orders Report.   1,250 mg 166.7 mL/hr over 90 Minutes Intravenous  Once 08/14/20 1911 08/14/20 2237       Subjective: Patient seen and evaluated today with no new acute complaints or concerns. No acute concerns or events noted overnight.  Objective: Vitals:   08/18/20 0004 08/18/20 0210 08/18/20 0643 08/18/20 1359  BP: (!) 90/59 102/65 110/68 108/72  Pulse: 96 72 87 93  Resp: (!) 22 (!) 24 (!) 22 18  Temp: (!) 97.5 F (36.4 C) 97.7 F (36.5 C) 97.8 F (36.6 C) (!) 97.5 F (36.4 C)   TempSrc: Oral Oral  Oral  SpO2: 95% 97% 99% 92%  Weight:      Height:        Intake/Output Summary (Last 24 hours) at 08/18/2020 1526 Last data filed at 08/18/2020 1300 Gross per 24 hour  Intake 1151.25 ml  Output 2625 ml  Net -1473.75 ml   Filed Weights   08/14/20 0949 08/14/20 1851  Weight: 55.3 kg 61.4 kg    Examination:  General exam: Appears calm and comfortable  Respiratory system: Clear to auscultation. Respiratory effort normal. Cardiovascular system: S1 & S2 heard, RRR.  Gastrointestinal system: Abdomen is soft Central nervous system: Alert and awake Extremities: No edema Skin: No significant lesions noted Psychiatry: Flat affect.    Data Reviewed: I have personally reviewed following labs and imaging studies  CBC: Recent Labs  Lab 08/14/20 1107 08/14/20 1734 08/15/20 0620 08/16/20 0600 08/17/20 1514 08/18/20 0603  WBC 2.0* 1.9* 2.5* 3.8* 4.6 7.5  NEUTROABS 1.6* 1.5*  --   --  4.1 6.6  HGB 12.5 11.1* 11.5* 12.3 13.8 13.8  HCT 36.6 32.7* 34.2* 36.5 38.7 40.8  MCV 92.4 91.9 91.2 90.8 88.0 91.3  PLT 62* 54* 53* 45* 49* 46*   Basic Metabolic Panel: Recent Labs  Lab 08/14/20 1107 08/15/20 0620 08/16/20 0600 08/17/20 1514 08/18/20 0603  NA 125* 129* 128* 126* 127*  K 3.1* 3.5 3.2* 4.3 5.4*  CL 98 103 103 107 108  CO2 19* 19* 16* 13* 13*  GLUCOSE 111* 88 85 101* 83  BUN 24* 18 15 28* 34*  CREATININE 0.98 0.90 0.75 1.22* 1.50*  CALCIUM 8.1* 7.3* 6.9* 6.5* 6.2*  MG  --   --   --  1.7  --    GFR: Estimated Creatinine Clearance: 31.3 mL/min (A) (by C-G formula based on SCr of 1.5 mg/dL (H)). Liver Function Tests: Recent Labs  Lab 08/14/20 1107 08/15/20 0620  AST 165* 174*  ALT 24 14  ALKPHOS 82 90  BILITOT 0.9 0.9  PROT 6.0* 5.1*  ALBUMIN 3.1* 2.7*   No results for input(s): LIPASE, AMYLASE in the last 168 hours. No results for input(s): AMMONIA in the last 168 hours. Coagulation Profile: Recent Labs  Lab 08/15/20 0620  INR 1.1    Cardiac Enzymes: No results for input(s): CKTOTAL, CKMB, CKMBINDEX, TROPONINI in the last 168 hours. BNP (last 3 results) No results for input(s): PROBNP in the last 8760 hours. HbA1C: No results for input(s): HGBA1C in the last 72 hours. CBG: No results for input(s): GLUCAP in the last 168 hours. Lipid Profile: No results for input(s): CHOL, HDL, LDLCALC, TRIG, CHOLHDL, LDLDIRECT in the last 72 hours. Thyroid Function Tests: No results for input(s): TSH, T4TOTAL, FREET4, T3FREE, THYROIDAB in the last 72 hours. Anemia Panel: No results for input(s): VITAMINB12, FOLATE, FERRITIN, TIBC, IRON, RETICCTPCT in the last  72 hours. Sepsis Labs: Recent Labs  Lab 08/14/20 1107 08/14/20 2030 08/15/20 0620 08/16/20 0600 08/18/20 0603  PROCALCITON 4.52  --  4.34 3.43 4.32  LATICACIDVEN  --  2.4*  --   --   --     Recent Results (from the past 240 hour(s))  Resp Panel by RT-PCR (Flu A&B, Covid) Nasopharyngeal Swab     Status: None   Collection Time: 08/14/20  2:22 PM   Specimen: Nasopharyngeal Swab; Nasopharyngeal(NP) swabs in vial transport medium  Result Value Ref Range Status   SARS Coronavirus 2 by RT PCR NEGATIVE NEGATIVE Final    Comment: (NOTE) SARS-CoV-2 target nucleic acids are NOT DETECTED.  The SARS-CoV-2 RNA is generally detectable in upper respiratory specimens during the acute phase of infection. The lowest concentration of SARS-CoV-2 viral copies this assay can detect is 138 copies/mL. A negative result does not preclude SARS-Cov-2 infection and should not be used as the sole basis for treatment or other patient management decisions. A negative result may occur with  improper specimen collection/handling, submission of specimen other than nasopharyngeal swab, presence of viral mutation(s) within the areas targeted by this assay, and inadequate number of viral copies(<138 copies/mL). A negative result must be combined with clinical observations, patient history, and  epidemiological information. The expected result is Negative.  Fact Sheet for Patients:  EntrepreneurPulse.com.au  Fact Sheet for Healthcare Providers:  IncredibleEmployment.be  This test is no t yet approved or cleared by the [place] FDA and  has been authorized for detection and/or diagnosis of SARS-CoV-2 by FDA under an Emergency Use Authorization (EUA). This EUA will remain  in effect (meaning this test can be used) for the duration of the COVID-19 declaration under Section 564(b)(1) of the Act, 21 U.S.C.section 360bbb-3(b)(1), unless the authorization is terminated  or revoked sooner.       Influenza A by PCR NEGATIVE NEGATIVE Final   Influenza B by PCR NEGATIVE NEGATIVE Final    Comment: (NOTE) The Xpert Xpress SARS-CoV-2/FLU/RSV plus assay is intended as an aid in the diagnosis of influenza from Nasopharyngeal swab specimens and should not be used as a sole basis for treatment. Nasal washings and aspirates are unacceptable for Xpert Xpress SARS-CoV-2/FLU/RSV testing.  Fact Sheet for Patients: EntrepreneurPulse.com.au  Fact Sheet for Healthcare Providers: IncredibleEmployment.be  This test is not yet approved or cleared by the [place] FDA and has been authorized for detection and/or diagnosis of SARS-CoV-2 by FDA under an Emergency Use Authorization (EUA). This EUA will remain in effect (meaning this test can be used) for the duration of the COVID-19 declaration under Section 564(b)(1) of the Act, 21 U.S.C. section 360bbb-3(b)(1), unless the authorization is terminated or revoked.  Performed at [facility] [address], [place], [place] [address]   Gastrointestinal Panel by PCR , Stool     Status: None   Collection Time: 08/14/20  4:04 PM   Specimen: Stool  Result Value Ref Range Status   Campylobacter species NOT DETECTED NOT DETECTED Final   Plesimonas shigelloides NOT  DETECTED NOT DETECTED Final   Salmonella species NOT DETECTED NOT DETECTED Final   Yersinia enterocolitica NOT DETECTED NOT DETECTED Final   Vibrio species NOT DETECTED NOT DETECTED Final   Vibrio cholerae NOT DETECTED NOT DETECTED Final   Enteroaggregative E coli (EAEC) NOT DETECTED NOT DETECTED Final   Enteropathogenic E coli (EPEC) NOT DETECTED NOT DETECTED Final   Enterotoxigenic E coli (ETEC) NOT DETECTED NOT DETECTED Final   Shiga like toxin  producing E coli (STEC) NOT DETECTED NOT DETECTED Final   Shigella/Enteroinvasive E coli (EIEC) NOT DETECTED NOT DETECTED Final   Cryptosporidium NOT DETECTED NOT DETECTED Final   Cyclospora cayetanensis NOT DETECTED NOT DETECTED Final   Entamoeba histolytica NOT DETECTED NOT DETECTED Final   Giardia lamblia NOT DETECTED NOT DETECTED Final   Adenovirus F40/41 NOT DETECTED NOT DETECTED Final   Astrovirus NOT DETECTED NOT DETECTED Final   Norovirus GI/GII NOT DETECTED NOT DETECTED Final   Rotavirus A NOT DETECTED NOT DETECTED Final   Sapovirus (I, II, IV, and V) NOT DETECTED NOT DETECTED Final    Comment: Performed at [facility], [address] [place], [place] [address]  Culture, blood (x 2)     Status: None (Preliminary result)   Collection Time: 08/14/20  8:29 PM   Specimen: BLOOD RIGHT HAND  Result Value Ref Range Status   Specimen Description BLOOD RIGHT HAND  Final   Special Requests   Final    BOTTLES DRAWN AEROBIC AND ANAEROBIC Blood Culture adequate volume   Culture   Final    NO GROWTH 3 DAYS Performed at [facility], [address] [place], [place] [address]    Report Status PENDING  Incomplete  Culture, blood (x 2)     Status: None (Preliminary result)   Collection Time: 08/14/20  8:39 PM   Specimen: BLOOD LEFT HAND  Result Value Ref Range Status   Specimen Description BLOOD LEFT HAND  Final   Special Requests   Final    BOTTLES DRAWN AEROBIC AND ANAEROBIC Blood Culture adequate volume   Culture   Final    NO  GROWTH 3 DAYS Performed at [facility], [address], [place], [place] [address]    Report Status PENDING  Incomplete  C Difficile Quick Screen w PCR reflex     Status: None   Collection Time: 08/16/20 10:00 PM   Specimen: STOOL  Result Value Ref Range Status   C Diff antigen NEGATIVE NEGATIVE Final   C Diff toxin NEGATIVE NEGATIVE Final   C Diff interpretation No C. difficile detected.  Final    Comment: Performed at [facility], [address], [place], [place] [address]         Radiology Studies: No results found.      Scheduled Meds:  carbidopa-levodopa  2 tablet Oral TID   Chlorhexidine Gluconate Cloth  6 each Topical Daily   clonazePAM  0.5 mg Oral Daily   escitalopram  20 mg Oral Q1400   feeding supplement  237 mL Oral TID BM   pantoprazole  40 mg Oral Daily   saccharomyces boulardii  250 mg Oral BID   Continuous Infusions:  sodium chloride 75 mL/hr at 08/18/20 0840   ceFEPime (MAXIPIME) IV 2 g (08/18/20 0847)   metronidazole 500 mg (08/18/20 1247)     LOS: 3 days    Time spent: 35 minutes    Shanequa Whitenight Darleen Crocker, DO [facility]  If 7PM-7AM, please contact night-coverage [email or web address] 08/18/2020, 3:26 PM

## 2020-08-18 NOTE — Progress Notes (Signed)
RE: Deborah [name] Date of Birth: 1945-11-08 Date: 08/18/2020  MUST ID: [id number]  To Whom It May Concern:   Please be advised that the above named patient will require a short-term nursing home stay-- anticipated 30 days or less rehabilitation and strengthening. The plan is for return home.

## 2020-08-18 NOTE — Progress Notes (Signed)
   08/18/20 2027  Assess: MEWS Score  Temp 97.7 F (36.5 C)  BP (!) 106/57  Pulse Rate 96  Resp (!) 34  SpO2 99 %  O2 Device Room Air  Assess: MEWS Score  MEWS Temp 0  MEWS Systolic 0  MEWS Pulse 0  MEWS RR 2  MEWS LOC 0  MEWS Score 2  MEWS Score Color Yellow  Assess: if the MEWS score is Yellow or Red  Were vital signs taken at a resting state? Yes  Focused Assessment Change from prior assessment (see assessment flowsheet)  Early Detection of Sepsis Score *See Row Information* Medium  MEWS guidelines implemented *See Row Information* Yes  Take Vital Signs  Increase Vital Sign Frequency  Yellow: Q 2hr X 2 then Q 4hr X 2, if remains yellow, continue Q 4hrs  Escalate  MEWS: Escalate Yellow: discuss with charge nurse/RN and consider discussing with provider and RRT  Notify: Charge Nurse/RN  Name of Charge Nurse/RN Notified Deeann Dowse  Date Charge Nurse/RN Notified 08/18/20  Time Charge Nurse/RN Notified 2027  Notify: Provider  Provider Name/Title Dr. Clearence Ped  Date Provider Notified 08/18/20  Time Provider Notified 2030  Notification Type Page  Notification Reason Change in status  Provider response  (new orders placed)  Date of Provider Response 08/18/20  Time of Provider Response 2035

## 2020-08-18 NOTE — NC FL2 (Signed)
[place] LEVEL OF CARE SCREENING TOOL     IDENTIFICATION  Patient Name: Deborah [name]. Schroepfer Birthdate: [birth year] Sex: female Admission Date (Current Location): 08/14/2020  Mobile and [place] Number:  [employer] and Address:  [facility] [address], [place]      Provider Number: [id number]  Attending Physician Name and Address:  Rodena Goldmann, DO  Relative Name and Phone Number:  [name], [name] (Spouse)   [phone]    Current Level of Care: Hospital Recommended Level of Care: [place] Prior Approval Number:    Date Approved/Denied:   PASRR Number: Pending  Discharge Plan: SNF    Current Diagnoses: Patient Active Problem List   Diagnosis Date Noted   SIRS (systemic inflammatory response syndrome) ([place]) 08/15/2020   Hyponatremia 08/14/2020   Seasonal allergies 07/28/2020   GERD (gastroesophageal reflux disease) 07/28/2020   Generalized anxiety disorder 07/28/2020   Parkinson disease ([place]) 07/28/2020   Tachycardia 07/28/2020   History of lung cancer 07/28/2020    Orientation RESPIRATION BLADDER Height & Weight     Self, Time, Situation, Place  Normal Continent Weight: 135 lb 5.8 oz (61.4 kg) Height:  5\' 6"  (167.6 cm)  BEHAVIORAL SYMPTOMS/MOOD NEUROLOGICAL BOWEL NUTRITION STATUS      Continent Diet (Diet regular Room service appropriate? Yes; Fluid consistency: Thin)  AMBULATORY STATUS COMMUNICATION OF NEEDS Skin   Extensive Assist Verbally Normal                       Personal Care Assistance Level of Assistance  Bathing, Feeding, Dressing Bathing Assistance: Maximum assistance Feeding assistance: Independent Dressing Assistance: Maximum assistance     Functional Limitations Info  Sight, Hearing, Speech Sight Info: Adequate Hearing Info: Adequate Speech Info: Adequate    SPECIAL CARE FACTORS FREQUENCY  PT (By licensed PT)     PT Frequency: 5x               Contractures Contractures Info: Not present    Additional Factors Info  Code Status, Allergies Code Status Info: Full Allergies Info: Amoxicillin-pot Clavulanate, Cefdinir, Ciprofloxacin,  Bioxin,   Sulfa Antibiotics           Current Medications (08/18/2020):  This is the current hospital active medication list Current Facility-Administered Medications  Medication Dose Route Frequency Provider Last Rate Last Admin   0.9 %  sodium chloride infusion   Intravenous Continuous Heath Lark D, DO 75 mL/hr at 08/18/20 0840 New Bag at 08/18/20 0840   acetaminophen (TYLENOL) tablet 650 mg  650 mg Oral Q6H PRN Elgergawy, Silver Huguenin, MD   650 mg at 08/17/20 2050   calcium gluconate 1 g/ 50 mL sodium chloride IVPB  1 g Intravenous Once Heath Lark D, DO 50 mL/hr at 08/18/20 0848 1,000 mg at 08/18/20 0848   carbidopa-levodopa (SINEMET IR) 25-100 MG per tablet immediate release 2 tablet  2 tablet Oral TID Elgergawy, Silver Huguenin, MD   2 tablet at 08/18/20 0843   ceFEPIme (MAXIPIME) 2 g in sodium chloride 0.9 % 100 mL IVPB  2 g Intravenous Q12H Elgergawy, Silver Huguenin, MD 200 mL/hr at 08/18/20 0847 2 g at 08/18/20 0847   clonazePAM (KLONOPIN) tablet 0.5 mg  0.5 mg Oral Daily Elgergawy, Silver Huguenin, MD   0.5 mg at 08/18/20 0844   escitalopram (LEXAPRO) tablet 20 mg  20 mg Oral Q1400 Elgergawy, Silver Huguenin, MD   20 mg at 08/17/20 1308   feeding  supplement (ENSURE ENLIVE / ENSURE PLUS) liquid 237 mL  237 mL Oral TID BM Barton Dubois, MD   237 mL at 08/16/20 0957   loperamide (IMODIUM) capsule 2 mg  2 mg Oral PRN Cristal Deer, MD   2 mg at 08/17/20 1049   metroNIDAZOLE (FLAGYL) IVPB 500 mg  500 mg Intravenous Q8H Elgergawy, Silver Huguenin, MD 100 mL/hr at 08/18/20 0424 500 mg at 08/18/20 0424   pantoprazole (PROTONIX) EC tablet 40 mg  40 mg Oral Daily Elgergawy, Silver Huguenin, MD   40 mg at 08/18/20 0844   phenol (CHLORASEPTIC) mouth spray 1 spray  1 spray Mouth/Throat PRN Cristal Deer, MD   1 spray at 08/18/20 0426    saccharomyces boulardii (FLORASTOR) capsule 250 mg  250 mg Oral BID Barton Dubois, MD   250 mg at 08/18/20 4373     Discharge Medications: Please see discharge summary for a list of discharge medications.  Relevant Imaging Results:  Relevant Lab Results:   Additional Information Pt SSN: [id number]  Natasha Bence, LCSW

## 2020-08-18 NOTE — Progress Notes (Signed)
Encouraged to stand and walk or sit in chair.  Only able to sit on side of bed with assist of two to be able to sit up.  Attempted to stand but unable.  Sat on side of bed for about 10 minutes and dozed off during that time and barely opened eyes.  No vomiting or diarrhea today and husband said that she ate a little better than yesterday.  Husband has been at bedside all day.

## 2020-08-18 NOTE — Progress Notes (Signed)
Upon assessment patient noted having periorbital edema, along with non-pitting edema from the thigh down. Sluggish capillary refill. Labored, shallow respirations at 34. She currently has NS at 59mL/hr and cefepime 122mL BID and metronidazole 126mL every 8 hours. Patient is only able to answer very brief short answers and only able to state name and birth month and day correctly, off by a year for her birth date. Unable to answer all questions. And only answers with a short answer, may have to ask more then once to get an answer. Stays resting with eyes closed or quietly staring off. Unable to drink prescribed ensure. This is a big change from baseline at admission. Message sent to on call doctor. See new orders.

## 2020-08-19 ENCOUNTER — Inpatient Hospital Stay (HOSPITAL_COMMUNITY): Payer: Medicare Other

## 2020-08-19 ENCOUNTER — Other Ambulatory Visit (HOSPITAL_COMMUNITY): Payer: PRIVATE HEALTH INSURANCE

## 2020-08-19 LAB — CBC
HCT: 36.6 % (ref 36.0–46.0)
Hemoglobin: 12.7 g/dL (ref 12.0–15.0)
MCH: 31.1 pg (ref 26.0–34.0)
MCHC: 34.7 g/dL (ref 30.0–36.0)
MCV: 89.5 fL (ref 80.0–100.0)
Platelets: 53 10*3/uL — ABNORMAL LOW (ref 150–400)
RBC: 4.09 MIL/uL (ref 3.87–5.11)
RDW: 15.5 % (ref 11.5–15.5)
WBC: 7.7 10*3/uL (ref 4.0–10.5)
nRBC: 0 % (ref 0.0–0.2)

## 2020-08-19 LAB — MRSA NEXT GEN BY PCR, NASAL: MRSA by PCR Next Gen: NOT DETECTED

## 2020-08-19 LAB — BRAIN NATRIURETIC PEPTIDE: B Natriuretic Peptide: 25 pg/mL (ref 0.0–100.0)

## 2020-08-19 LAB — BASIC METABOLIC PANEL
Anion gap: 4 — ABNORMAL LOW (ref 5–15)
BUN: 37 mg/dL — ABNORMAL HIGH (ref 8–23)
CO2: 15 mmol/L — ABNORMAL LOW (ref 22–32)
Calcium: 6.3 mg/dL — CL (ref 8.9–10.3)
Chloride: 110 mmol/L (ref 98–111)
Creatinine, Ser: 1.68 mg/dL — ABNORMAL HIGH (ref 0.44–1.00)
GFR, Estimated: 32 mL/min — ABNORMAL LOW (ref >60)
Glucose, Bld: 84 mg/dL (ref 70–99)
Potassium: 4.4 mmol/L (ref 3.5–5.1)
Sodium: 129 mmol/L — ABNORMAL LOW (ref 135–145)

## 2020-08-19 LAB — CULTURE, BLOOD (ROUTINE X 2)
Culture: NO GROWTH
Culture: NO GROWTH
Special Requests: ADEQUATE
Special Requests: ADEQUATE

## 2020-08-19 LAB — TROPONIN I (HIGH SENSITIVITY): Troponin I (High Sensitivity): 15 ng/L (ref <18)

## 2020-08-19 LAB — PROCALCITONIN: Procalcitonin: 3.12 ng/mL

## 2020-08-19 LAB — MAGNESIUM: Magnesium: 1.9 mg/dL (ref 1.7–2.4)

## 2020-08-19 LAB — AMMONIA: Ammonia: 28 umol/L (ref 9–35)

## 2020-08-19 LAB — CREATININE, URINE, RANDOM: Creatinine, Urine: 56.12 mg/dL

## 2020-08-19 LAB — SODIUM, URINE, RANDOM: Sodium, Ur: 47 mmol/L

## 2020-08-19 MED ORDER — MORPHINE SULFATE (PF) 2 MG/ML IV SOLN
2.0000 mg | Freq: Once | INTRAVENOUS | Status: AC
  Administered 2020-08-19: 2 mg via INTRAVENOUS
  Filled 2020-08-19: qty 1

## 2020-08-19 MED ORDER — SODIUM CHLORIDE 0.9 % IV SOLN
2.0000 g | INTRAVENOUS | Status: DC
  Administered 2020-08-19: 2 g via INTRAVENOUS
  Filled 2020-08-19: qty 2

## 2020-08-19 MED ORDER — CALCIUM GLUCONATE-NACL 1-0.675 GM/50ML-% IV SOLN
1.0000 g | INTRAVENOUS | Status: AC
  Administered 2020-08-19 (×2): 1000 mg via INTRAVENOUS
  Filled 2020-08-19 (×2): qty 50

## 2020-08-19 MED ORDER — SODIUM BICARBONATE 650 MG PO TABS
650.0000 mg | ORAL_TABLET | Freq: Two times a day (BID) | ORAL | Status: DC
  Administered 2020-08-19 – 2020-08-25 (×8): 650 mg via ORAL
  Filled 2020-08-19 (×11): qty 1

## 2020-08-19 MED ORDER — METOPROLOL TARTRATE 5 MG/5ML IV SOLN: 5.0000 mg | Freq: Four times a day (QID) | INTRAVENOUS | Status: DC | PRN

## 2020-08-19 MED ORDER — CALCIUM GLUCONATE-NACL 2-0.675 GM/100ML-% IV SOLN
2.0000 g | Freq: Once | INTRAVENOUS | Status: DC
  Filled 2020-08-19: qty 100

## 2020-08-19 MED ORDER — ATENOLOL 25 MG PO TABS
25.0000 mg | ORAL_TABLET | Freq: Every day | ORAL | Status: DC
  Administered 2020-08-19 – 2020-08-25 (×4): 25 mg via ORAL
  Filled 2020-08-19 (×6): qty 1

## 2020-08-19 NOTE — Progress Notes (Signed)
Notified MD of Critical value - Calcium 6.3. No new orders given at this time.

## 2020-08-19 NOTE — Progress Notes (Addendum)
Central tele reported 5 second run of afib with RVR. Informed Dr. Manuella Ghazi, see new orders. Pt asymptomatic, resting with eyes closed. Husband at bedside.

## 2020-08-19 NOTE — Progress Notes (Signed)
Pharmacy Antibiotic Note  Deborah [name] is a 75 y.o. female admitted on 08/14/2020 with sepsis and intra-abdominal infection .  Pharmacy has been consulted for cefepime and vancomycin dosing. Scr increased 1.22> 1.68, adjust antibiotics. D#6 , f/u LOT  Plan: Decrease Cefepime 2000 mg IV every 24 hours. Monitor labs, c/s Monitor for de-escalation.  Height: 5\' 6"  (167.6 cm) Weight: 61.4 kg (135 lb 5.8 oz) IBW/kg (Calculated) : 59.3  Temp (24hrs), Avg:97.7 F (36.5 C), Min:97.4 F (36.3 C), Max:98.1 F (36.7 C)  Recent Labs  Lab 08/14/20 2030 08/15/20 0620 08/16/20 0600 08/17/20 1514 08/18/20 0603 08/19/20 0503  WBC  --  2.5* 3.8* 4.6 7.5 7.7  CREATININE  --  0.90 0.75 1.22* 1.50* 1.68*  LATICACIDVEN 2.4*  --   --   --   --   --      Estimated Creatinine Clearance: 27.9 mL/min (A) (by C-G formula based on SCr of 1.68 mg/dL (H)).    Allergies  Allergen Reactions   Amoxicillin-Pot Clavulanate Nausea And Vomiting   Cefdinir Nausea And Vomiting   Ciprofloxacin Hives and Itching   Other     Bioxin    Sulfa Antibiotics Hives and Itching    Antimicrobials this admission: Cefepime 6/9 >> Vanco 6/9 >>6/12 Flagyl 6/9 >>  6/9 Bcx: ngtd Cdiff neg  Thank you for allowing pharmacy to be a part of this patient's care.  Isac Sarna, BS Pharm D, [place] Clinical Pharmacist Pager [phone] 08/19/2020 7:51 AM

## 2020-08-19 NOTE — TOC Progression Note (Signed)
Transition of Care (TOC) - Progression Note    Patient Details  Name: Deborah [name]. Walski MRN: [id number] Date of Birth: 1945/05/03  Transition of [facility] CM/SW [place], [place] Phone Number: 08/19/2020, 1:53 PM  Clinical Narrative:    CSW spoke with pts husband who states that they would like to accept bed at [facility]. CSW updated bed acceptance in the HUB. Pt has been vaccinated for COVID x3. TOC to follow.   Expected Discharge Plan: [place] Barriers to Discharge: Continued Medical Work up  Expected Discharge Plan and Services Expected Discharge Plan: [place] In-house Referral: Clinical Social Work Discharge Planning Services: CM Consult Post Acute Care Choice: [place] Living arrangements for the past 2 months: Single Family Home                 DME Arranged: N/A DME Agency: NA       HH Arranged: NA HH Agency: NA         Social Determinants of Health (SDOH) Interventions    Readmission Risk Interventions Readmission Risk Prevention Plan 08/18/2020  Medication Screening Complete  Transportation Screening Complete

## 2020-08-19 NOTE — Progress Notes (Signed)
   08/19/20 0305  Assess: MEWS Score  Temp 97.8 F (36.6 C)  BP 110/67  Pulse Rate 90  Resp (!) 24  Level of Consciousness Responds to Pain  SpO2 96 %  O2 Device Room Air  Assess: MEWS Score  MEWS Temp 0  MEWS Systolic 0  MEWS Pulse 0  MEWS RR 1  MEWS LOC 2  MEWS Score 3  MEWS Score Color Yellow  Assess: if the MEWS score is Yellow or Red  Were vital signs taken at a resting state? Yes  Focused Assessment Change from prior assessment (see assessment flowsheet)

## 2020-08-19 NOTE — Progress Notes (Signed)
   08/19/20 0453  Assess: MEWS Score  Temp 97.6 F (36.4 C)  BP 125/67  Pulse Rate 86  Resp (!) 28  Level of Consciousness Responds to Pain  SpO2 97 %  O2 Device Room Air  Assess: MEWS Score  MEWS Temp 0  MEWS Systolic 0  MEWS Pulse 0  MEWS RR 2  MEWS LOC 2  MEWS Score 4  MEWS Score Color Red  Assess: if the MEWS score is Yellow or Red  Were vital signs taken at a resting state? Yes  Focused Assessment Change from prior assessment (see assessment flowsheet)  Early Detection of Sepsis Score *See Row Information* Low  MEWS guidelines implemented *See Row Information* Yes  Treat  MEWS Interventions Escalated (See documentation below)  Pain Scale Faces  Faces Pain Scale 6  Pain Type Other (Comment) (Patient denies pain)  Breathing 1  Negative Vocalization 1  Facial Expression 1  Body Language 0  Consolability 0  PAINAD Score 3  Interventions Dark room;Reposition  Patients response to intervention Unchanged  Take Vital Signs  Increase Vital Sign Frequency  Red: Q 1hr X 4 then Q 4hr X 4, if remains red, continue Q 4hrs  Escalate  MEWS: Escalate Red: discuss with charge nurse/RN and provider, consider discussing with RRT  Notify: Charge Nurse/RN  Name of Charge Nurse/RN Notified Deeann Dowse, RN  Date Charge Nurse/RN Notified 08/19/20  Time Charge Nurse/RN Notified 0505  Notify: Provider  Provider Name/Title Dr. Clearence Ped  Date Provider Notified 08/19/20  Time Provider Notified [phone]  Notification Type  (Secure chat)  Notification Reason Change in status  Provider response No new orders  Date of Provider Response 08/19/20  Time of Provider Response [phone]

## 2020-08-19 NOTE — Progress Notes (Signed)
PROGRESS NOTE    Deborah [name]. Deborah [name]  GBT:[id number] DOB: [birth year] DOA: 08/14/2020 PCP: Loman Brooklyn, FNP   Brief Narrative:   Deborah [name]  is a 75 y.o. female, with past medical history of Parkinson, depression, anxiety, GERD and hypertension, patient presents to ED secondary to multiple complaints, including nausea, vomiting, weakness and diarrhea, reports symptoms started 2 weeks ago after they ate in a steak place.  She was admitted with SIRS criteria as well as [name] and electrolyte abnormalities to include hypocalcemia.  Continues to have ongoing weakness and confusion with poor appetite.  Assessment & Plan:   Active Problems:   GERD (gastroesophageal reflux disease)   Generalized anxiety disorder   Parkinson disease (HCC)   Hyponatremia   SIRS (systemic inflammatory response syndrome) (HCC)   Acute metabolic encephalopathy-multifactorial -Likely related to potential infection, [name], and significant electrolyte abnormalities in the setting of parkinsonism -Continues with weakness and poor oral intake -PT evaluation recommending SNF  SIRS -Continue on cefepime and Flagyl only, discontinued vancomycin 6/13 -Procalcitonin stable -No significant fevers noted, repeat blood cultures -No signs of overt infection noted  Mild dyspnea with bilateral pleural effusions -No overt need for Lasix at this time, continue to monitor -Currently without hypoxemia   [name] with metabolic acidosis -Started on sodium bicarbonate -IV fluids held due to some volume overload -Check urine electrolytes; renal ultrasound without any obvious hydronephrosis -Follow a.m. labs   Hyponatremia/hypocalcemia -Replete and monitor -Likely related to GI loss   Diarrhea-resolved -Negative C. Difficile   Parkinsonism -Continue Sinemet   Thrombocytopenia-stable -Currently stable, continue to monitor -Avoid heparin agents -Follow-up with hematology outpatient   DVT prophylaxis: SCDs Code Status:  Full Family Communication: Husband, Doug at bedside 6/14 Disposition Plan:  Status is: Inpatient   Remains inpatient appropriate because:Persistent severe electrolyte disturbances, Altered mental status, Ongoing diagnostic testing needed not appropriate for outpatient work up, and IV treatments appropriate due to intensity of illness or inability to take PO   Dispo: The patient is from: Home              Anticipated d/c is to: SNF              Patient currently is not medically stable to d/c.              Difficult to place patient No     Consultants:  None   Procedures:  See below  Antimicrobials:  Anti-infectives (From admission, onward)    Start     Dose/Rate Route Frequency Ordered Stop   08/19/20 2015  ceFEPIme (MAXIPIME) 2 g in sodium chloride 0.9 % 100 mL IVPB        2 g 200 mL/hr over 30 Minutes Intravenous Every 24 hours 08/19/20 0751     08/15/20 2000  vancomycin (VANCOCIN) IVPB 1000 mg/200 mL premix  Status:  Discontinued       See Hyperspace for full Linked Orders Report.   1,000 mg 200 mL/hr over 60 Minutes Intravenous Every 24 hours 08/14/20 1911 08/18/20 0705   08/14/20 2200  ceFEPIme (MAXIPIME) 2 g in sodium chloride 0.9 % 100 mL IVPB  Status:  Discontinued        2 g 200 mL/hr over 30 Minutes Intravenous Every 12 hours 08/14/20 1905 08/14/20 2008   08/14/20 2100  ceFEPIme (MAXIPIME) 2 g in sodium chloride 0.9 % 100 mL IVPB  Status:  Discontinued        2 g 200 mL/hr over 30 Minutes Intravenous Every  12 hours 08/14/20 2008 08/14/20 2015   08/14/20 2015  ceFEPIme (MAXIPIME) 2 g in sodium chloride 0.9 % 100 mL IVPB  Status:  Discontinued        2 g 200 mL/hr over 30 Minutes Intravenous Every 12 hours 08/14/20 2014 08/19/20 0751   08/14/20 2000  metroNIDAZOLE (FLAGYL) IVPB 500 mg        500 mg 100 mL/hr over 60 Minutes Intravenous Every 8 hours 08/14/20 1900     08/14/20 2000  vancomycin (VANCOREADY) IVPB 1250 mg/250 mL       See Hyperspace for full Linked  Orders Report.   1,250 mg 166.7 mL/hr over 90 Minutes Intravenous  Once 08/14/20 1911 08/14/20 2237       Subjective: Patient seen and evaluated today with ongoing restlessness and confusion with poor appetite.  She was noted to be restless last night and weak.  She was noted to have mild tachypnea and chest x-ray was performed demonstrating bilateral pleural effusions.  Objective: Vitals:   08/19/20 0305 08/19/20 0453 08/19/20 0554 08/19/20 0843  BP: 110/67 125/67 115/70 125/70  Pulse: 90 86 90 78  Resp: (!) 24 (!) 28 20 (!) 26  Temp: 97.8 F (36.6 C) 97.6 F (36.4 C) (!) 97.4 F (36.3 C) (!) 97.3 F (36.3 C)  TempSrc: Axillary Axillary Oral Axillary  SpO2: 96% 97% 97% 100%  Weight:      Height:        Intake/Output Summary (Last 24 hours) at 08/19/2020 1033 Last data filed at 08/19/2020 0700 Gross per 24 hour  Intake 1391.25 ml  Output 1725 ml  Net -333.75 ml   Filed Weights   08/14/20 0949 08/14/20 1851  Weight: 55.3 kg 61.4 kg    Examination:  General exam: Appears restless and confused Respiratory system: Clear to auscultation. Respiratory effort normal.  Currently on room air.  No respiratory distress. Cardiovascular system: S1 & S2 heard, RRR.  Gastrointestinal system: Abdomen is soft Central nervous system: Intermittently alert Extremities: No edema Skin: No significant lesions noted Psychiatry: Flat affect.    Data Reviewed: I have personally reviewed following labs and imaging studies  CBC: Recent Labs  Lab 08/14/20 1107 08/14/20 1734 08/15/20 0620 08/16/20 0600 08/17/20 1514 08/18/20 0603 08/19/20 0503  WBC 2.0* 1.9* 2.5* 3.8* 4.6 7.5 7.7  NEUTROABS 1.6* 1.5*  --   --  4.1 6.6  --   HGB 12.5 11.1* 11.5* 12.3 13.8 13.8 12.7  HCT 36.6 32.7* 34.2* 36.5 38.7 40.8 36.6  MCV 92.4 91.9 91.2 90.8 88.0 91.3 89.5  PLT 62* 54* 53* 45* 49* 46* 53*   Basic Metabolic Panel: Recent Labs  Lab 08/15/20 0620 08/16/20 0600 08/17/20 1514 08/18/20 0603  08/19/20 0503  NA 129* 128* 126* 127* 129*  K 3.5 3.2* 4.3 5.4* 4.4  CL 103 103 107 108 110  CO2 19* 16* 13* 13* 15*  GLUCOSE 88 85 101* 83 84  BUN 18 15 28* 34* 37*  CREATININE 0.90 0.75 1.22* 1.50* 1.68*  CALCIUM 7.3* 6.9* 6.5* 6.2* 6.3*  MG  --   --  1.7  --  1.9   GFR: Estimated Creatinine Clearance: 27.9 mL/min (A) (by C-G formula based on SCr of 1.68 mg/dL (H)). Liver Function Tests: Recent Labs  Lab 08/14/20 1107 08/15/20 0620  AST 165* 174*  ALT 24 14  ALKPHOS 82 90  BILITOT 0.9 0.9  PROT 6.0* 5.1*  ALBUMIN 3.1* 2.7*   No results for input(s): LIPASE, AMYLASE in  the last 168 hours. No results for input(s): AMMONIA in the last 168 hours. Coagulation Profile: Recent Labs  Lab 08/15/20 0620  INR 1.1   Cardiac Enzymes: No results for input(s): CKTOTAL, CKMB, CKMBINDEX, TROPONINI in the last 168 hours. BNP (last 3 results) No results for input(s): PROBNP in the last 8760 hours. HbA1C: No results for input(s): HGBA1C in the last 72 hours. CBG: No results for input(s): GLUCAP in the last 168 hours. Lipid Profile: No results for input(s): CHOL, HDL, LDLCALC, TRIG, CHOLHDL, LDLDIRECT in the last 72 hours. Thyroid Function Tests: No results for input(s): TSH, T4TOTAL, FREET4, T3FREE, THYROIDAB in the last 72 hours. Anemia Panel: No results for input(s): VITAMINB12, FOLATE, FERRITIN, TIBC, IRON, RETICCTPCT in the last 72 hours. Sepsis Labs: Recent Labs  Lab 08/14/20 2030 08/15/20 0620 08/16/20 0600 08/18/20 0603 08/19/20 0503  PROCALCITON  --  4.34 3.43 4.32 3.12  LATICACIDVEN 2.4*  --   --   --   --     Recent Results (from the past 240 hour(s))  Resp Panel by RT-PCR (Flu A&B, Covid) Nasopharyngeal Swab     Status: None   Collection Time: 08/14/20  2:22 PM   Specimen: Nasopharyngeal Swab; Nasopharyngeal(NP) swabs in vial transport medium  Result Value Ref Range Status   SARS Coronavirus 2 by RT PCR NEGATIVE NEGATIVE Final    Comment: (NOTE) SARS-CoV-2  target nucleic acids are NOT DETECTED.  The SARS-CoV-2 RNA is generally detectable in upper respiratory specimens during the acute phase of infection. The lowest concentration of SARS-CoV-2 viral copies this assay can detect is 138 copies/mL. A negative result does not preclude SARS-Cov-2 infection and should not be used as the sole basis for treatment or other patient management decisions. A negative result may occur with  improper specimen collection/handling, submission of specimen other than nasopharyngeal swab, presence of viral mutation(s) within the areas targeted by this assay, and inadequate number of viral copies(<138 copies/mL). A negative result must be combined with clinical observations, patient history, and epidemiological information. The expected result is Negative.  Fact Sheet for Patients:  EntrepreneurPulse.com.au  Fact Sheet for Healthcare Providers:  IncredibleEmployment.be  This test is no t yet approved or cleared by the [place] FDA and  has been authorized for detection and/or diagnosis of SARS-CoV-2 by FDA under an Emergency Use Authorization (EUA). This EUA will remain  in effect (meaning this test can be used) for the duration of the COVID-19 declaration under Section 564(b)(1) of the Act, 21 U.S.C.section 360bbb-3(b)(1), unless the authorization is terminated  or revoked sooner.       Influenza A by PCR NEGATIVE NEGATIVE Final   Influenza B by PCR NEGATIVE NEGATIVE Final    Comment: (NOTE) The Xpert Xpress SARS-CoV-2/FLU/RSV plus assay is intended as an aid in the diagnosis of influenza from Nasopharyngeal swab specimens and should not be used as a sole basis for treatment. Nasal washings and aspirates are unacceptable for Xpert Xpress SARS-CoV-2/FLU/RSV testing.  Fact Sheet for Patients: EntrepreneurPulse.com.au  Fact Sheet for Healthcare  Providers: IncredibleEmployment.be  This test is not yet approved or cleared by the [place] FDA and has been authorized for detection and/or diagnosis of SARS-CoV-2 by FDA under an Emergency Use Authorization (EUA). This EUA will remain in effect (meaning this test can be used) for the duration of the COVID-19 declaration under Section 564(b)(1) of the Act, 21 U.S.C. section 360bbb-3(b)(1), unless the authorization is terminated or revoked.  Performed at [facility], [address],  [place], [place] [address]   Gastrointestinal Panel by PCR , Stool     Status: None   Collection Time: 08/14/20  4:04 PM   Specimen: Stool  Result Value Ref Range Status   Campylobacter species NOT DETECTED NOT DETECTED Final   Plesimonas shigelloides NOT DETECTED NOT DETECTED Final   Salmonella species NOT DETECTED NOT DETECTED Final   Yersinia enterocolitica NOT DETECTED NOT DETECTED Final   Vibrio species NOT DETECTED NOT DETECTED Final   Vibrio cholerae NOT DETECTED NOT DETECTED Final   Enteroaggregative E coli (EAEC) NOT DETECTED NOT DETECTED Final   Enteropathogenic E coli (EPEC) NOT DETECTED NOT DETECTED Final   Enterotoxigenic E coli (ETEC) NOT DETECTED NOT DETECTED Final   Shiga like toxin producing E coli (STEC) NOT DETECTED NOT DETECTED Final   Shigella/Enteroinvasive E coli (EIEC) NOT DETECTED NOT DETECTED Final   Cryptosporidium NOT DETECTED NOT DETECTED Final   Cyclospora cayetanensis NOT DETECTED NOT DETECTED Final   Entamoeba histolytica NOT DETECTED NOT DETECTED Final   Giardia lamblia NOT DETECTED NOT DETECTED Final   Adenovirus F40/41 NOT DETECTED NOT DETECTED Final   Astrovirus NOT DETECTED NOT DETECTED Final   Norovirus GI/GII NOT DETECTED NOT DETECTED Final   Rotavirus A NOT DETECTED NOT DETECTED Final   Sapovirus (I, II, IV, and V) NOT DETECTED NOT DETECTED Final    Comment: Performed at [facility], [address] [place], [place] [address]   Culture, blood (x 2)     Status: None   Collection Time: 08/14/20  8:29 PM   Specimen: BLOOD RIGHT HAND  Result Value Ref Range Status   Specimen Description BLOOD RIGHT HAND  Final   Special Requests   Final    BOTTLES DRAWN AEROBIC AND ANAEROBIC Blood Culture adequate volume   Culture   Final    NO GROWTH 5 DAYS Performed at [facility], [address] [place], [place] [address]    Report Status 08/19/2020 FINAL  Final  Culture, blood (x 2)     Status: None   Collection Time: 08/14/20  8:39 PM   Specimen: BLOOD LEFT HAND  Result Value Ref Range Status   Specimen Description BLOOD LEFT HAND  Final   Special Requests   Final    BOTTLES DRAWN AEROBIC AND ANAEROBIC Blood Culture adequate volume   Culture   Final    NO GROWTH 5 DAYS Performed at [facility], [address], [place],  [address]    Report Status 08/19/2020 FINAL  Final  C Difficile Quick Screen w PCR reflex     Status: None   Collection Time: 08/16/20 10:00 PM   Specimen: STOOL  Result Value Ref Range Status   C Diff antigen NEGATIVE NEGATIVE Final   C Diff toxin NEGATIVE NEGATIVE Final   C Diff interpretation No C. difficile detected.  Final    Comment: Performed at [facility] [address] [place],  [address]         Radiology Studies: CT HEAD WO CONTRAST  Result Date: 08/18/2020 CLINICAL DATA:  75 year old female with altered mental status. EXAM: CT HEAD WITHOUT CONTRAST TECHNIQUE: Contiguous axial images were obtained from the base of the skull through the vertex without intravenous contrast. COMPARISON:  None. FINDINGS: Brain: The ventricles and sulci are appropriate size for patient's age. The gray-white matter discrimination is preserved. There is no acute intracranial hemorrhage. No mass effect or midline shift no extra-axial fluid collection. Vascular: No hyperdense vessel or unexpected calcification. Skull: Normal.  Negative for fracture or focal lesion. Sinuses/Orbits: Mild  mucoperiosteal thickening of paranasal sinuses. No air-fluid level. The mastoid air cells are clear. Other: None IMPRESSION: Unremarkable noncontrast CT of the brain. Electronically Signed   By: Anner Crete M.D.   On: 08/18/2020 21:37   US RENAL  Result Date: 08/19/2020 CLINICAL DATA:  Acute renal injury EXAM: RENAL / URINARY TRACT ULTRASOUND COMPLETE COMPARISON:  08/15/2020 FINDINGS: Right Kidney: Renal measurements: 10.8 x 4.7 x 5.5 cm. = volume: 146 mL. Echogenicity within normal limits. No mass or hydronephrosis visualized. Left Kidney: Renal measurements: 11.5 x 4.8 x 4.7 cm = volume: 137 mL. Echogenicity within normal limits. No mass or hydronephrosis visualized. Bladder: Decompressed by Foley catheter. Other: Minimal ascites is noted stable from previous CT. IMPRESSION: Minimal ascites. Normal appearing kidneys bilaterally. Electronically Signed   By: Inez Catalina M.D.   On: 08/19/2020 08:35   DG CHEST PORT 1 VIEW  Result Date: 08/18/2020 CLINICAL DATA:  Shortness of breath EXAM: PORTABLE CHEST 1 VIEW COMPARISON:  08/14/2020 FINDINGS: Heart size within normal limits. Small bilateral pleural effusions. Hazy right basilar opacity, which may reflect atelectasis. No pneumothorax. IMPRESSION: Small bilateral pleural effusions. Hazy right basilar opacity, which may reflect atelectasis. Electronically Signed   By: Davina Poke D.O.   On: 08/18/2020 21:25        Scheduled Meds:  carbidopa-levodopa  2 tablet Oral TID   Chlorhexidine Gluconate Cloth  6 each Topical Daily   clonazePAM  0.5 mg Oral Daily   escitalopram  20 mg Oral Q1400   feeding supplement  237 mL Oral TID BM   pantoprazole  40 mg Oral Daily   saccharomyces boulardii  250 mg Oral BID   sodium bicarbonate  650 mg Oral BID   Continuous Infusions:  calcium gluconate 1,000 mg (08/19/20 1014)   ceFEPime (MAXIPIME) IV     metronidazole 500 mg (08/19/20 0407)     LOS: 4 days    Time spent: 35 minutes    Lyonel Morejon Darleen Crocker, DO [facility]  If 7PM-7AM, please contact night-coverage [email or web address] 08/19/2020, 10:33 AM

## 2020-08-19 NOTE — Progress Notes (Signed)
   08/19/20 0554  Assess: MEWS Score  Temp (!) 97.4 F (36.3 C)  BP 115/70  Pulse Rate 90  Resp 20  Level of Consciousness Responds to Pain  SpO2 97 %  O2 Device Room Air  Assess: MEWS Score  MEWS Temp 0  MEWS Systolic 0  MEWS Pulse 0  MEWS RR 0  MEWS LOC 2  MEWS Score 2  MEWS Score Color Yellow  Assess: if the MEWS score is Yellow or Red  Were vital signs taken at a resting state? Yes  Focused Assessment Change from prior assessment (see assessment flowsheet)  Early Detection of Sepsis Score *See Row Information* Low  MEWS guidelines implemented *See Row Information* Yes

## 2020-08-19 NOTE — Progress Notes (Addendum)
Patient seen and evaluated at bedside. RN paged 2/2 patient feeling restless. Patient is still slightly tachypnic. Turned off fluids earlier 2/2 BL pleural effusions with atelectasis. Patient lung sounds are clear. HR is tachycardic with some irregular beats. Will get EKG and trop. Will order temp, and give morphine for restlessness. Avoiding benzo as I don't want to knock out her respiratory drive. Avoidiing lasix 2/2 soft BP.

## 2020-08-19 NOTE — Progress Notes (Signed)
PT Cancellation Note  Patient Details Name: Deborah [name] MRN: [id number] DOB: May 04, 1945   Cancelled Treatment:    Reason Eval/Treat Not Completed: Fatigue/lethargy limiting ability to participate. Pt's spouse at bedside  upon arrival, he states the pt has been lethargic all morning, he can't get her to eat and he tried to help her exercise her legs earlier but she told him to stop due to pain. RN notified of no PT due to pt's lethargy. Will continue acute PT efforts.    Talbot Grumbling PT, DPT 08/19/20, 12:51 PM

## 2020-08-20 ENCOUNTER — Inpatient Hospital Stay (HOSPITAL_COMMUNITY): Payer: Medicare Other

## 2020-08-20 DIAGNOSIS — R0603 Acute respiratory distress: Secondary | ICD-10-CM

## 2020-08-20 LAB — CBC
HCT: 30.6 % — ABNORMAL LOW (ref 36.0–46.0)
Hemoglobin: 10.5 g/dL — ABNORMAL LOW (ref 12.0–15.0)
MCH: 31.3 pg (ref 26.0–34.0)
MCHC: 34.3 g/dL (ref 30.0–36.0)
MCV: 91.3 fL (ref 80.0–100.0)
Platelets: 44 10*3/uL — ABNORMAL LOW (ref 150–400)
RBC: 3.35 MIL/uL — ABNORMAL LOW (ref 3.87–5.11)
RDW: 16 % — ABNORMAL HIGH (ref 11.5–15.5)
WBC: 6.1 10*3/uL (ref 4.0–10.5)
nRBC: 0 % (ref 0.0–0.2)

## 2020-08-20 LAB — BASIC METABOLIC PANEL
Anion gap: 5 (ref 5–15)
BUN: 30 mg/dL — ABNORMAL HIGH (ref 8–23)
CO2: 13 mmol/L — ABNORMAL LOW (ref 22–32)
Calcium: 5.1 mg/dL — CL (ref 8.9–10.3)
Chloride: 116 mmol/L — ABNORMAL HIGH (ref 98–111)
Creatinine, Ser: 1.32 mg/dL — ABNORMAL HIGH (ref 0.44–1.00)
GFR, Estimated: 43 mL/min — ABNORMAL LOW (ref >60)
Glucose, Bld: 68 mg/dL — ABNORMAL LOW (ref 70–99)
Potassium: 3 mmol/L — ABNORMAL LOW (ref 3.5–5.1)
Sodium: 134 mmol/L — ABNORMAL LOW (ref 135–145)

## 2020-08-20 LAB — PHOSPHORUS: Phosphorus: 4.6 mg/dL (ref 2.5–4.6)

## 2020-08-20 LAB — MAGNESIUM: Magnesium: 1.5 mg/dL — ABNORMAL LOW (ref 1.7–2.4)

## 2020-08-20 LAB — ECHOCARDIOGRAM COMPLETE
Area-P 1/2: 3.43 cm2
Height: 66 in
P 1/2 time: 619 msec
S' Lateral: 3.04 cm
Weight: 2165.8 oz

## 2020-08-20 LAB — PARATHYROID HORMONE, INTACT (NO CA): PTH: 71 pg/mL — ABNORMAL HIGH (ref 15–65)

## 2020-08-20 LAB — CALCITRIOL (1,25 DI-OH VIT D): Vit D, 1,25-Dihydroxy: 46.1 pg/mL (ref 24.8–81.5)

## 2020-08-20 LAB — GLUCOSE, CAPILLARY: Glucose-Capillary: 124 mg/dL — ABNORMAL HIGH (ref 70–99)

## 2020-08-20 LAB — PROCALCITONIN: Procalcitonin: 1.68 ng/mL

## 2020-08-20 MED ORDER — CALCIUM GLUCONATE-NACL 1-0.675 GM/50ML-% IV SOLN
1.0000 g | INTRAVENOUS | Status: AC
  Administered 2020-08-20 (×2): 1000 mg via INTRAVENOUS
  Filled 2020-08-20 (×2): qty 50

## 2020-08-20 MED ORDER — MAGNESIUM SULFATE 2 GM/50ML IV SOLN
2.0000 g | Freq: Once | INTRAVENOUS | Status: AC
  Administered 2020-08-20: 2 g via INTRAVENOUS
  Filled 2020-08-20: qty 50

## 2020-08-20 MED ORDER — CALCIUM GLUCONATE-NACL 1-0.675 GM/50ML-% IV SOLN
1.0000 g | Freq: Once | INTRAVENOUS | Status: AC
  Administered 2020-08-20: 1000 mg via INTRAVENOUS
  Filled 2020-08-20: qty 50

## 2020-08-20 MED ORDER — DEXTROSE 50 % IV SOLN: 1.0000 | Freq: Once | INTRAVENOUS | Status: AC

## 2020-08-20 MED ORDER — POTASSIUM CHLORIDE 10 MEQ/100ML IV SOLN
10.0000 meq | INTRAVENOUS | Status: AC
  Administered 2020-08-20 (×4): 10 meq via INTRAVENOUS
  Filled 2020-08-20 (×4): qty 100

## 2020-08-20 MED ORDER — DEXTROSE 50 % IV SOLN
INTRAVENOUS | Status: AC
  Filled 2020-08-20: qty 50

## 2020-08-20 MED ORDER — DEXTROSE-NACL 5-0.9 % IV SOLN: INTRAVENOUS | Status: DC

## 2020-08-20 MED ORDER — CALCIUM GLUCONATE-NACL 2-0.675 GM/100ML-% IV SOLN
2.0000 g | Freq: Once | INTRAVENOUS | Status: DC
  Filled 2020-08-20: qty 100

## 2020-08-20 NOTE — Progress Notes (Addendum)
Date and time results received: 08/20/20 0628   Test: Calcium Critical Value: 5.1  Name of Provider Notified: A. Zierie-Ghosh  Orders Received? Or Actions Taken?: Calcium gluconate 1g/68mL intravenous

## 2020-08-20 NOTE — Progress Notes (Signed)
PROGRESS NOTE    Deborah [name]. Frede  PXT:[id number] DOB: Sep 13, 1945 DOA: 08/14/2020 PCP: Loman Brooklyn, FNP   Brief Narrative:   Deborah [name]  is a 75 y.o. female, with past medical history of Parkinson, depression, anxiety, GERD and hypertension, patient presents to ED secondary to multiple complaints, including nausea, vomiting, weakness and diarrhea, reports symptoms started 2 weeks ago after they ate in a steak place.  She was admitted with SIRS criteria as well as [name] and electrolyte abnormalities to include hypocalcemia.  Continues to have ongoing weakness and confusion with poor appetite.  Assessment & Plan:   Active Problems:   GERD (gastroesophageal reflux disease)   Generalized anxiety disorder   Parkinson disease (HCC)   Hyponatremia   SIRS (systemic inflammatory response syndrome) (HCC)   Acute metabolic encephalopathy-multifactorial -Likely related to potential infection, [name], and significant electrolyte abnormalities in the setting of parkinsonism -Continues with weakness and poor oral intake -PT evaluation recommending SNF   SIRS-resolved -Discontinue further antibiotics -Procalcitonin stable -No significant fevers noted, repeat blood cultures -No signs of overt infection noted  Concern for tick bite -Check Lyme and RMSF titers   Mild dyspnea with bilateral pleural effusions -No overt need for Lasix at this time, continue to monitor -Currently without hypoxemia   [name] with metabolic acidosis-stable -Started on sodium bicarbonate -IV fluids to be given with some D5 due to poor oral intake -Check urine electrolytes; renal ultrasound without any obvious hydronephrosis -Follow a.m. labs   Hypokalemia/hypomagnesemia/hypocalcemia -Replete and monitor -Likely related to GI loss   Diarrhea-resolved -Negative C. Difficile -DC further antibiotics 6/15   Parkinsonism -Continue Sinemet   Thrombocytopenia-stable -Currently stable, continue to  monitor -Avoid heparin agents -Follow-up with hematology outpatient   DVT prophylaxis: SCDs Code Status: Full Family Communication: Husband, Doug at bedside 6/15 Disposition Plan:  Status is: Inpatient   Remains inpatient appropriate because:Persistent severe electrolyte disturbances, Altered mental status, Ongoing diagnostic testing needed not appropriate for outpatient work up, and IV treatments appropriate due to intensity of illness or inability to take PO   Dispo: The patient is from: Home              Anticipated d/c is to: SNF              Patient currently is not medically stable to d/c.              Difficult to place patient No     Consultants:  None   Procedures:  See below  Antimicrobials:  Anti-infectives (From admission, onward)    Start     Dose/Rate Route Frequency Ordered Stop   08/19/20 2015  ceFEPIme (MAXIPIME) 2 g in sodium chloride 0.9 % 100 mL IVPB  Status:  Discontinued        2 g 200 mL/hr over 30 Minutes Intravenous Every 24 hours 08/19/20 0751 08/20/20 0938   08/15/20 2000  vancomycin (VANCOCIN) IVPB 1000 mg/200 mL premix  Status:  Discontinued       See Hyperspace for full Linked Orders Report.   1,000 mg 200 mL/hr over 60 Minutes Intravenous Every 24 hours 08/14/20 1911 08/18/20 0705   08/14/20 2200  ceFEPIme (MAXIPIME) 2 g in sodium chloride 0.9 % 100 mL IVPB  Status:  Discontinued        2 g 200 mL/hr over 30 Minutes Intravenous Every 12 hours 08/14/20 1905 08/14/20 2008   08/14/20 2100  ceFEPIme (MAXIPIME) 2 g in sodium chloride 0.9 % 100 mL IVPB  Status:  Discontinued        2 g 200 mL/hr over 30 Minutes Intravenous Every 12 hours 08/14/20 2008 08/14/20 2015   08/14/20 2015  ceFEPIme (MAXIPIME) 2 g in sodium chloride 0.9 % 100 mL IVPB  Status:  Discontinued        2 g 200 mL/hr over 30 Minutes Intravenous Every 12 hours 08/14/20 2014 08/19/20 0751   08/14/20 2000  metroNIDAZOLE (FLAGYL) IVPB 500 mg  Status:  Discontinued        500 mg 100  mL/hr over 60 Minutes Intravenous Every 8 hours 08/14/20 1900 08/20/20 0938   08/14/20 2000  vancomycin (VANCOREADY) IVPB 1250 mg/250 mL       See Hyperspace for full Linked Orders Report.   1,250 mg 166.7 mL/hr over 90 Minutes Intravenous  Once 08/14/20 1911 08/14/20 2237       Subjective: Patient seen and evaluated today with ongoing somnolence and poor p.o. intake.  She continues to remain weak.  Objective: Vitals:   08/19/20 0843 08/19/20 1347 08/19/20 1958 08/20/20 0535  BP: 125/70 113/60 116/71 123/70  Pulse: 78 80 68 65  Resp: (!) 26  19 20   Temp: (!) 97.3 F (36.3 C) (!) 97.5 F (36.4 C) 97.8 F (36.6 C)   TempSrc: Axillary Oral Oral   SpO2: 100% 99% 98% 99%  Weight:      Height:        Intake/Output Summary (Last 24 hours) at 08/20/2020 1231 Last data filed at 08/20/2020 0700 Gross per 24 hour  Intake --  Output 750 ml  Net -750 ml   Filed Weights   08/14/20 0949 08/14/20 1851  Weight: 55.3 kg 61.4 kg    Examination:  General exam: Appears somnolent, but arousable-listless Respiratory system: Clear to auscultation. Respiratory effort normal. Cardiovascular system: S1 & S2 heard, RRR.  Gastrointestinal system: Abdomen is soft Central nervous system: Somnolent Extremities: No edema Skin: No significant lesions noted Psychiatry: Flat affect.    Data Reviewed: I have personally reviewed following labs and imaging studies  CBC: Recent Labs  Lab 08/14/20 1107 08/14/20 1734 08/15/20 0620 08/16/20 0600 08/17/20 1514 08/18/20 0603 08/19/20 0503 08/20/20 0516  WBC 2.0* 1.9*   < > 3.8* 4.6 7.5 7.7 6.1  NEUTROABS 1.6* 1.5*  --   --  4.1 6.6  --   --   HGB 12.5 11.1*   < > 12.3 13.8 13.8 12.7 10.5*  HCT 36.6 32.7*   < > 36.5 38.7 40.8 36.6 30.6*  MCV 92.4 91.9   < > 90.8 88.0 91.3 89.5 91.3  PLT 62* 54*   < > 45* 49* 46* 53* 44*   < > = values in this interval not displayed.   Basic Metabolic Panel: Recent Labs  Lab 08/16/20 0600 08/17/20 1514  08/18/20 0603 08/19/20 0503 08/20/20 0516  NA 128* 126* 127* 129* 134*  K 3.2* 4.3 5.4* 4.4 3.0*  CL 103 107 108 110 116*  CO2 16* 13* 13* 15* 13*  GLUCOSE 85 101* 83 84 68*  BUN 15 28* 34* 37* 30*  CREATININE 0.75 1.22* 1.50* 1.68* 1.32*  CALCIUM 6.9* 6.5* 6.2* 6.3* 5.1*  MG  --  1.7  --  1.9 1.5*  PHOS  --   --   --   --  4.6   GFR: Estimated Creatinine Clearance: 35.5 mL/min (A) (by C-G formula based on SCr of 1.32 mg/dL (H)). Liver Function Tests: Recent Labs  Lab 08/14/20 1107 08/15/20 0960  AST 165* 174*  ALT 24 14  ALKPHOS 82 90  BILITOT 0.9 0.9  PROT 6.0* 5.1*  ALBUMIN 3.1* 2.7*   No results for input(s): LIPASE, AMYLASE in the last 168 hours. Recent Labs  Lab 08/19/20 1126  AMMONIA 28   Coagulation Profile: Recent Labs  Lab 08/15/20 0620  INR 1.1   Cardiac Enzymes: No results for input(s): CKTOTAL, CKMB, CKMBINDEX, TROPONINI in the last 168 hours. BNP (last 3 results) No results for input(s): PROBNP in the last 8760 hours. HbA1C: No results for input(s): HGBA1C in the last 72 hours. CBG: Recent Labs  Lab 08/20/20 0817  GLUCAP 124*   Lipid Profile: No results for input(s): CHOL, HDL, LDLCALC, TRIG, CHOLHDL, LDLDIRECT in the last 72 hours. Thyroid Function Tests: No results for input(s): TSH, T4TOTAL, FREET4, T3FREE, THYROIDAB in the last 72 hours. Anemia Panel: No results for input(s): VITAMINB12, FOLATE, FERRITIN, TIBC, IRON, RETICCTPCT in the last 72 hours. Sepsis Labs: Recent Labs  Lab 08/14/20 2030 08/15/20 0620 08/16/20 0600 08/18/20 0603 08/19/20 0503 08/20/20 0516  PROCALCITON  --    < > 3.43 4.32 3.12 1.68  LATICACIDVEN 2.4*  --   --   --   --   --    < > = values in this interval not displayed.    Recent Results (from the past 240 hour(s))  Resp Panel by RT-PCR (Flu A&B, Covid) Nasopharyngeal Swab     Status: None   Collection Time: 08/14/20  2:22 PM   Specimen: Nasopharyngeal Swab; Nasopharyngeal(NP) swabs in vial transport  medium  Result Value Ref Range Status   SARS Coronavirus 2 by RT PCR NEGATIVE NEGATIVE Final    Comment: (NOTE) SARS-CoV-2 target nucleic acids are NOT DETECTED.  The SARS-CoV-2 RNA is generally detectable in upper respiratory specimens during the acute phase of infection. The lowest concentration of SARS-CoV-2 viral copies this assay can detect is 138 copies/mL. A negative result does not preclude SARS-Cov-2 infection and should not be used as the sole basis for treatment or other patient management decisions. A negative result may occur with  improper specimen collection/handling, submission of specimen other than nasopharyngeal swab, presence of viral mutation(s) within the areas targeted by this assay, and inadequate number of viral copies(<138 copies/mL). A negative result must be combined with clinical observations, patient history, and epidemiological information. The expected result is Negative.  Fact Sheet for Patients:  EntrepreneurPulse.com.au  Fact Sheet for Healthcare Providers:  IncredibleEmployment.be  This test is no t yet approved or cleared by the [place] FDA and  has been authorized for detection and/or diagnosis of SARS-CoV-2 by FDA under an Emergency Use Authorization (EUA). This EUA will remain  in effect (meaning this test can be used) for the duration of the COVID-19 declaration under Section 564(b)(1) of the Act, 21 U.S.C.section 360bbb-3(b)(1), unless the authorization is terminated  or revoked sooner.       Influenza A by PCR NEGATIVE NEGATIVE Final   Influenza B by PCR NEGATIVE NEGATIVE Final    Comment: (NOTE) The Xpert Xpress SARS-CoV-2/FLU/RSV plus assay is intended as an aid in the diagnosis of influenza from Nasopharyngeal swab specimens and should not be used as a sole basis for treatment. Nasal washings and aspirates are unacceptable for Xpert Xpress SARS-CoV-2/FLU/RSV testing.  Fact Sheet for  Patients: EntrepreneurPulse.com.au  Fact Sheet for Healthcare Providers: IncredibleEmployment.be  This test is not yet approved or cleared by the [place] FDA and has been authorized for detection and/or diagnosis of  SARS-CoV-2 by FDA under an Emergency Use Authorization (EUA). This EUA will remain in effect (meaning this test can be used) for the duration of the COVID-19 declaration under Section 564(b)(1) of the Act, 21 U.S.C. section 360bbb-3(b)(1), unless the authorization is terminated or revoked.  Performed at [facility], [address], [place], [place] [address]   Gastrointestinal Panel by PCR , Stool     Status: None   Collection Time: 08/14/20  4:04 PM   Specimen: Stool  Result Value Ref Range Status   Campylobacter species NOT DETECTED NOT DETECTED Final   Plesimonas shigelloides NOT DETECTED NOT DETECTED Final   Salmonella species NOT DETECTED NOT DETECTED Final   Yersinia enterocolitica NOT DETECTED NOT DETECTED Final   Vibrio species NOT DETECTED NOT DETECTED Final   Vibrio cholerae NOT DETECTED NOT DETECTED Final   Enteroaggregative E coli (EAEC) NOT DETECTED NOT DETECTED Final   Enteropathogenic E coli (EPEC) NOT DETECTED NOT DETECTED Final   Enterotoxigenic E coli (ETEC) NOT DETECTED NOT DETECTED Final   Shiga like toxin producing E coli (STEC) NOT DETECTED NOT DETECTED Final   Shigella/Enteroinvasive E coli (EIEC) NOT DETECTED NOT DETECTED Final   Cryptosporidium NOT DETECTED NOT DETECTED Final   Cyclospora cayetanensis NOT DETECTED NOT DETECTED Final   Entamoeba histolytica NOT DETECTED NOT DETECTED Final   Giardia lamblia NOT DETECTED NOT DETECTED Final   Adenovirus F40/41 NOT DETECTED NOT DETECTED Final   Astrovirus NOT DETECTED NOT DETECTED Final   Norovirus GI/GII NOT DETECTED NOT DETECTED Final   Rotavirus A NOT DETECTED NOT DETECTED Final   Sapovirus (I, II, IV, and V) NOT DETECTED NOT DETECTED Final    Comment:  Performed at [facility], [address] [place], [place] [address]  Culture, blood (x 2)     Status: None   Collection Time: 08/14/20  8:29 PM   Specimen: BLOOD RIGHT HAND  Result Value Ref Range Status   Specimen Description BLOOD RIGHT HAND  Final   Special Requests   Final    BOTTLES DRAWN AEROBIC AND ANAEROBIC Blood Culture adequate volume   Culture   Final    NO GROWTH 5 DAYS Performed at [facility], [address], [place], [place] [address]    Report Status 08/19/2020 FINAL  Final  Culture, blood (x 2)     Status: None   Collection Time: 08/14/20  8:39 PM   Specimen: BLOOD LEFT HAND  Result Value Ref Range Status   Specimen Description BLOOD LEFT HAND  Final   Special Requests   Final    BOTTLES DRAWN AEROBIC AND ANAEROBIC Blood Culture adequate volume   Culture   Final    NO GROWTH 5 DAYS Performed at [facility], [address] [place], [place] [address]    Report Status 08/19/2020 FINAL  Final  C Difficile Quick Screen w PCR reflex     Status: None   Collection Time: 08/16/20 10:00 PM   Specimen: STOOL  Result Value Ref Range Status   C Diff antigen NEGATIVE NEGATIVE Final   C Diff toxin NEGATIVE NEGATIVE Final   C Diff interpretation No C. difficile detected.  Final    Comment: Performed at [facility], [address], [place], [place] [address]  Culture, blood (routine x 2)     Status: None (Preliminary result)   Collection Time: 08/19/20 11:26 AM   Specimen: BLOOD  Result Value Ref Range Status   Specimen Description BLOOD BLOOD LEFT HAND  Final  Special Requests   Final    Blood Culture adequate volume BOTTLES DRAWN AEROBIC AND ANAEROBIC   Culture   Final    NO GROWTH < 24 HOURS Performed at [facility], [address] [place], [place] [address]    Report Status PENDING  Incomplete  Culture, blood (routine x 2)     Status: None (Preliminary result)   Collection Time: 08/19/20 11:26 AM   Specimen: BLOOD  Result Value Ref Range Status    Specimen Description BLOOD LEFT ANTECUBITAL  Final   Special Requests   Final    Blood Culture adequate volume BOTTLES DRAWN AEROBIC AND ANAEROBIC   Culture   Final    NO GROWTH < 24 HOURS Performed at [facility], [address], [place], [place] [address]    Report Status PENDING  Incomplete  MRSA Next Gen by PCR, Nasal     Status: None   Collection Time: 08/19/20 11:49 AM  Result Value Ref Range Status   MRSA by PCR Next Gen NOT DETECTED NOT DETECTED Final    Comment: (NOTE) The GeneXpert MRSA Assay (FDA approved for NASAL specimens only), is one component of a comprehensive MRSA colonization surveillance program. It is not intended to diagnose MRSA infection nor to guide or monitor treatment for MRSA infections. Test performance is not FDA approved in patients less than 57 years old. Performed at [facility], [address], [place], [place] [address]          Radiology Studies: CT HEAD WO CONTRAST  Result Date: 08/18/2020 CLINICAL DATA:  75 year old female with altered mental status. EXAM: CT HEAD WITHOUT CONTRAST TECHNIQUE: Contiguous axial images were obtained from the base of the skull through the vertex without intravenous contrast. COMPARISON:  None. FINDINGS: Brain: The ventricles and sulci are appropriate size for patient's age. The gray-white matter discrimination is preserved. There is no acute intracranial hemorrhage. No mass effect or midline shift no extra-axial fluid collection. Vascular: No hyperdense vessel or unexpected calcification. Skull: Normal. Negative for fracture or focal lesion. Sinuses/Orbits: Mild mucoperiosteal thickening of paranasal sinuses. No air-fluid level. The mastoid air cells are clear. Other: None IMPRESSION: Unremarkable noncontrast CT of the brain. Electronically Signed   By: Anner Crete M.D.   On: 08/18/2020 21:37   US RENAL  Result Date: 08/19/2020 CLINICAL DATA:  Acute renal injury EXAM: RENAL / URINARY TRACT ULTRASOUND COMPLETE  COMPARISON:  08/15/2020 FINDINGS: Right Kidney: Renal measurements: 10.8 x 4.7 x 5.5 cm. = volume: 146 mL. Echogenicity within normal limits. No mass or hydronephrosis visualized. Left Kidney: Renal measurements: 11.5 x 4.8 x 4.7 cm = volume: 137 mL. Echogenicity within normal limits. No mass or hydronephrosis visualized. Bladder: Decompressed by Foley catheter. Other: Minimal ascites is noted stable from previous CT. IMPRESSION: Minimal ascites. Normal appearing kidneys bilaterally. Electronically Signed   By: Inez Catalina M.D.   On: 08/19/2020 08:35   DG CHEST PORT 1 VIEW  Result Date: 08/18/2020 CLINICAL DATA:  Shortness of breath EXAM: PORTABLE CHEST 1 VIEW COMPARISON:  08/14/2020 FINDINGS: Heart size within normal limits. Small bilateral pleural effusions. Hazy right basilar opacity, which may reflect atelectasis. No pneumothorax. IMPRESSION: Small bilateral pleural effusions. Hazy right basilar opacity, which may reflect atelectasis. Electronically Signed   By: Davina Poke D.O.   On: 08/18/2020 21:25        Scheduled Meds:  atenolol  25 mg Oral Daily   carbidopa-levodopa  2 tablet Oral TID   Chlorhexidine Gluconate Cloth  6 each  Topical Daily   feeding supplement  237 mL Oral TID BM   pantoprazole  40 mg Oral Daily   saccharomyces boulardii  250 mg Oral BID   sodium bicarbonate  650 mg Oral BID   Continuous Infusions:  potassium chloride 10 mEq (08/20/20 1146)     LOS: 5 days    Time spent: 35 minutes    Theodora Lalanne Darleen Crocker, DO [facility]  If 7PM-7AM, please contact night-coverage [email or web address] 08/20/2020, 12:31 PM

## 2020-08-20 NOTE — Progress Notes (Signed)
Initial Nutrition Assessment  DOCUMENTATION CODES:      INTERVENTION:  Ensure Enlive po TID, each supplement provides 350 kcal and 20 grams of protein   Regular diet   If patient remains unable to take po's consider NGT placement  NUTRITION DIAGNOSIS:   Inadequate oral intake related to lethargy/confusion, inability to eat as evidenced by meal completion < 25%, per patient/family report (LOS day 6).   GOAL:  Pt to meet >/= 90% of their estimated nutrition needs     MONITOR:  PO intake, Supplement acceptance, Labs, Weight trends, Skin  REASON FOR ASSESSMENT:   Rounds    ASSESSMENT: Patient is a 75 yo female with hx of Parkinson, GERD, Hypertension. Presents with [name], abnormal electrolytes and complaint of NV/D.   Patient has been mostly asleep during her hospitalization per husband. She has consumed bites only (mashed potatoes) meals since admission. EE TID ordered (prefers chocolate-only) but patient has not consumed.   If patient continues to be unable to take nutrition orally -recommend consider placement on NGT . High risk for malnutrition - energy intake <25% x 6 days.   Weight:  5/18/ss- 57 kg Current-61.4 kg -patient has mild pitting edema BLE  Medications reviewed and include: Sinemet, Protonix, Florastor, Lexapro, Sodium bicarbonate.  IVF- D5%/NS @ 60 ml/hr   Labs: BMP Latest Ref Rng & Units 08/20/2020 08/19/2020 08/18/2020  Glucose 70 - 99 mg/dL 68(L) 84 83  BUN 8 - 23 mg/dL 30(H) 37(H) 34(H)  Creatinine 0.44 - 1.00 mg/dL 1.32(H) 1.68(H) 1.50(H)  Sodium 135 - 145 mmol/L 134(L) 129(L) 127(L)  Potassium 3.5 - 5.1 mmol/L 3.0(L) 4.4 5.4(H)  Chloride 98 - 111 mmol/L 116(H) 110 108  CO2 22 - 32 mmol/L 13(L) 15(L) 13(L)  Calcium 8.9 - 10.3 mg/dL 5.1(LL) 6.3(LL) 6.2(LL)     NUTRITION - FOCUSED PHYSICAL EXAM:  Unable to complete Nutrition-Focused physical exam at this time.    Diet Order:   Diet Order             Diet regular Room service appropriate? Yes;  Fluid consistency: Thin  Diet effective now                   EDUCATION NEEDS:  Not appropriate for education at this time  Skin:  Skin Assessment: Reviewed RN Assessment  Last BM:  unkown based on chart review  Height:   Ht Readings from Last 1 Encounters:  08/14/20 5\' 6"  (1.676 m)    Weight:   Wt Readings from Last 1 Encounters:  08/14/20 61.4 kg    Ideal Body Weight:   59 kg  BMI:  Body mass index is 21.85 kg/m.  Estimated Nutritional Needs:   Kcal:  1600-1750  Protein:  85-93 gr  Fluid:  1800 ml daily   Colman Cater MS,RD,CSG,LDN Contact: Shea Evans

## 2020-08-20 NOTE — Progress Notes (Signed)
  Echocardiogram 2D Echocardiogram has been performed.  Deborah [name] 08/20/2020, 3:02 PM

## 2020-08-20 NOTE — Care Management Important Message (Signed)
Important Message  Patient Details  Name: Deborah [name] MRN: [id number] Date of Birth: Jul 27, 1945   Medicare Important Message Given:  Yes     Tommy Medal 08/20/2020, 1:19 PM

## 2020-08-20 NOTE — Progress Notes (Signed)
Patient too drowsy to take PO meds, husband remains at bedside, will continue to monitor.

## 2020-08-21 ENCOUNTER — Inpatient Hospital Stay (HOSPITAL_COMMUNITY): Payer: Medicare Other

## 2020-08-21 LAB — BASIC METABOLIC PANEL
Anion gap: 2 — ABNORMAL LOW (ref 5–15)
BUN: 30 mg/dL — ABNORMAL HIGH (ref 8–23)
CO2: 18 mmol/L — ABNORMAL LOW (ref 22–32)
Calcium: 7 mg/dL — ABNORMAL LOW (ref 8.9–10.3)
Chloride: 116 mmol/L — ABNORMAL HIGH (ref 98–111)
Creatinine, Ser: 1.36 mg/dL — ABNORMAL HIGH (ref 0.44–1.00)
GFR, Estimated: 41 mL/min — ABNORMAL LOW (ref >60)
Glucose, Bld: 215 mg/dL — ABNORMAL HIGH (ref 70–99)
Potassium: 3.7 mmol/L (ref 3.5–5.1)
Sodium: 136 mmol/L (ref 135–145)

## 2020-08-21 LAB — CBC
HCT: 35.7 % — ABNORMAL LOW (ref 36.0–46.0)
Hemoglobin: 12.1 g/dL (ref 12.0–15.0)
MCH: 30.4 pg (ref 26.0–34.0)
MCHC: 33.9 g/dL (ref 30.0–36.0)
MCV: 89.7 fL (ref 80.0–100.0)
Platelets: 63 10*3/uL — ABNORMAL LOW (ref 150–400)
RBC: 3.98 MIL/uL (ref 3.87–5.11)
RDW: 15.9 % — ABNORMAL HIGH (ref 11.5–15.5)
WBC: 5.6 10*3/uL (ref 4.0–10.5)
nRBC: 0 % (ref 0.0–0.2)

## 2020-08-21 LAB — MAGNESIUM: Magnesium: 2.1 mg/dL (ref 1.7–2.4)

## 2020-08-21 LAB — PROCALCITONIN: Procalcitonin: 1.8 ng/mL

## 2020-08-21 MED ORDER — FLUMAZENIL 0.5 MG/5ML IV SOLN
0.5000 mg | Freq: Once | INTRAVENOUS | Status: AC
  Administered 2020-08-21: 0.5 mg via INTRAVENOUS
  Filled 2020-08-21: qty 5

## 2020-08-21 MED ORDER — ACETAMINOPHEN 160 MG/5ML PO SOLN: 650.0000 mg | Freq: Four times a day (QID) | ORAL | Status: DC | PRN

## 2020-08-21 MED ORDER — FLUMAZENIL 0.5 MG/5ML IV SOLN
0.3000 mg | Freq: Once | INTRAVENOUS | Status: AC
  Administered 2020-08-21: 0.3 mg via INTRAVENOUS
  Filled 2020-08-21: qty 5

## 2020-08-21 NOTE — Progress Notes (Signed)
PROGRESS NOTE    Deborah [name]. Deborah [name]  LKT:[id number] DOB: [birth year] DOA: 08/14/2020 PCP: Loman Brooklyn, FNP   Brief Narrative:   Deborah [name]  is a 75 y.o. female, with past medical history of Parkinson, depression, anxiety, GERD and hypertension, patient presents to ED secondary to multiple complaints, including nausea, vomiting, weakness and diarrhea, reports symptoms started 2 weeks ago after they ate in a steak place.  She was admitted with SIRS criteria as well as [name] and electrolyte abnormalities to include hypocalcemia.  Continues to have ongoing weakness and confusion with poor appetite.  Assessment & Plan:   Active Problems:   GERD (gastroesophageal reflux disease)   Generalized anxiety disorder   Parkinson disease (HCC)   Hyponatremia   SIRS (systemic inflammatory response syndrome) (HCC)   Acute metabolic encephalopathy-multifactorial -Likely related to potential infection, [name], and significant electrolyte abnormalities in the setting of parkinsonism -Plan to trial Romazicon 6/16 due to suspicion of benzodiazepine overdose -Continues with weakness and poor oral intake -PT evaluation recommending SNF   SIRS-resolved -Discontinue further antibiotics -Procalcitonin stable -No significant fevers noted, repeat blood cultures -No signs of overt infection noted   Concern for tick bite -Check Lyme and RMSF titers, pending   Mild dyspnea with bilateral pleural effusions -No overt need for Lasix at this time, continue to monitor -Currently without hypoxemia   [name] with metabolic acidosis-stable -Started on sodium bicarbonate -IV fluids to be given with some D5 due to poor oral intake -Check urine electrolytes; renal ultrasound without any obvious hydronephrosis -Follow a.m. labs   Hypokalemia/hypomagnesemia/hypocalcemia-improving -Monitor in a.m.   Diarrhea-resolved -Negative C. Difficile -DC further antibiotics 6/15   Parkinsonism -Continue Sinemet    Thrombocytopenia-stable -Currently stable, continue to monitor -Avoid heparin agents -Follow-up with hematology outpatient   DVT prophylaxis: SCDs Code Status: Full Family Communication: Husband, Doug at bedside 6/16 Disposition Plan:  Status is: Inpatient   Remains inpatient appropriate because:Persistent severe electrolyte disturbances, Altered mental status, Ongoing diagnostic testing needed not appropriate for outpatient work up, and IV treatments appropriate due to intensity of illness or inability to take PO   Dispo: The patient is from: Home              Anticipated d/c is to: SNF              Patient currently is not medically stable to d/c.              Difficult to place patient No     Consultants:  None   Procedures:  See below  Antimicrobials:  Anti-infectives (From admission, onward)    Start     Dose/Rate Route Frequency Ordered Stop   08/19/20 2015  ceFEPIme (MAXIPIME) 2 g in sodium chloride 0.9 % 100 mL IVPB  Status:  Discontinued        2 g 200 mL/hr over 30 Minutes Intravenous Every 24 hours 08/19/20 0751 08/20/20 0938   08/15/20 2000  vancomycin (VANCOCIN) IVPB 1000 mg/200 mL premix  Status:  Discontinued       See Hyperspace for full Linked Orders Report.   1,000 mg 200 mL/hr over 60 Minutes Intravenous Every 24 hours 08/14/20 1911 08/18/20 0705   08/14/20 2200  ceFEPIme (MAXIPIME) 2 g in sodium chloride 0.9 % 100 mL IVPB  Status:  Discontinued        2 g 200 mL/hr over 30 Minutes Intravenous Every 12 hours 08/14/20 1905 08/14/20 2008   08/14/20 2100  ceFEPIme (MAXIPIME) 2 g in  sodium chloride 0.9 % 100 mL IVPB  Status:  Discontinued        2 g 200 mL/hr over 30 Minutes Intravenous Every 12 hours 08/14/20 2008 08/14/20 2015   08/14/20 2015  ceFEPIme (MAXIPIME) 2 g in sodium chloride 0.9 % 100 mL IVPB  Status:  Discontinued        2 g 200 mL/hr over 30 Minutes Intravenous Every 12 hours 08/14/20 2014 08/19/20 0751   08/14/20 2000  metroNIDAZOLE  (FLAGYL) IVPB 500 mg  Status:  Discontinued        500 mg 100 mL/hr over 60 Minutes Intravenous Every 8 hours 08/14/20 1900 08/20/20 0938   08/14/20 2000  vancomycin (VANCOREADY) IVPB 1250 mg/250 mL       See Hyperspace for full Linked Orders Report.   1,250 mg 166.7 mL/hr over 90 Minutes Intravenous  Once 08/14/20 1911 08/14/20 2237       Subjective: Patient seen and evaluated today with ongoing somnolence noted.  Husband at bedside.  Objective: Vitals:   08/20/20 0535 08/20/20 2053 08/21/20 0404 08/21/20 1112  BP: 123/70 107/79 134/76   Pulse: 65 71 67   Resp: 20 18 18    Temp:  98.6 F (37 C) 98.6 F (37 C) 98.9 F (37.2 C)  TempSrc:  Oral Oral   SpO2: 99% 97% 100%   Weight:      Height:        Intake/Output Summary (Last 24 hours) at 08/21/2020 1234 Last data filed at 08/21/2020 0403 Gross per 24 hour  Intake 638.06 ml  Output 1400 ml  Net -761.94 ml   Filed Weights   08/14/20 0949 08/14/20 1851  Weight: 55.3 kg 61.4 kg    Examination:  General exam: Appears somnolent and briefly arouses to tactile stimulation Respiratory system: Clear to auscultation. Respiratory effort normal. Cardiovascular system: S1 & S2 heard, RRR.  Gastrointestinal system: Abdomen is soft Central nervous system: Somnolent Extremities: No edema Skin: No significant lesions noted Psychiatry: Flat affect.    Data Reviewed: I have personally reviewed following labs and imaging studies  CBC: Recent Labs  Lab 08/14/20 1734 08/15/20 0620 08/17/20 1514 08/18/20 0603 08/19/20 0503 08/20/20 0516 08/21/20 0510  WBC 1.9*   < > 4.6 7.5 7.7 6.1 5.6  NEUTROABS 1.5*  --  4.1 6.6  --   --   --   HGB 11.1*   < > 13.8 13.8 12.7 10.5* 12.1  HCT 32.7*   < > 38.7 40.8 36.6 30.6* 35.7*  MCV 91.9   < > 88.0 91.3 89.5 91.3 89.7  PLT 54*   < > 49* 46* 53* 44* 63*   < > = values in this interval not displayed.   Basic Metabolic Panel: Recent Labs  Lab 08/17/20 1514 08/18/20 0603  08/19/20 0503 08/20/20 0516 08/21/20 0510  NA 126* 127* 129* 134* 136  K 4.3 5.4* 4.4 3.0* 3.7  CL 107 108 110 116* 116*  CO2 13* 13* 15* 13* 18*  GLUCOSE 101* 83 84 68* 215*  BUN 28* 34* 37* 30* 30*  CREATININE 1.22* 1.50* 1.68* 1.32* 1.36*  CALCIUM 6.5* 6.2* 6.3* 5.1* 7.0*  MG 1.7  --  1.9 1.5* 2.1  PHOS  --   --   --  4.6  --    GFR: Estimated Creatinine Clearance: 34.5 mL/min (A) (by C-G formula based on SCr of 1.36 mg/dL (H)). Liver Function Tests: Recent Labs  Lab 08/15/20 0620  AST 174*  ALT 14  ALKPHOS 90  BILITOT 0.9  PROT 5.1*  ALBUMIN 2.7*   No results for input(s): LIPASE, AMYLASE in the last 168 hours. Recent Labs  Lab 08/19/20 1126  AMMONIA 28   Coagulation Profile: Recent Labs  Lab 08/15/20 0620  INR 1.1   Cardiac Enzymes: No results for input(s): CKTOTAL, CKMB, CKMBINDEX, TROPONINI in the last 168 hours. BNP (last 3 results) No results for input(s): PROBNP in the last 8760 hours. HbA1C: No results for input(s): HGBA1C in the last 72 hours. CBG: Recent Labs  Lab 08/20/20 0817  GLUCAP 124*   Lipid Profile: No results for input(s): CHOL, HDL, LDLCALC, TRIG, CHOLHDL, LDLDIRECT in the last 72 hours. Thyroid Function Tests: No results for input(s): TSH, T4TOTAL, FREET4, T3FREE, THYROIDAB in the last 72 hours. Anemia Panel: No results for input(s): VITAMINB12, FOLATE, FERRITIN, TIBC, IRON, RETICCTPCT in the last 72 hours. Sepsis Labs: Recent Labs  Lab 08/14/20 2030 08/15/20 0620 08/18/20 0603 08/19/20 0503 08/20/20 0516 08/21/20 0510  PROCALCITON  --    < > 4.32 3.12 1.68 1.80  LATICACIDVEN 2.4*  --   --   --   --   --    < > = values in this interval not displayed.    Recent Results (from the past 240 hour(s))  Resp Panel by RT-PCR (Flu A&B, Covid) Nasopharyngeal Swab     Status: None   Collection Time: 08/14/20  2:22 PM   Specimen: Nasopharyngeal Swab; Nasopharyngeal(NP) swabs in vial transport medium  Result Value Ref Range  Status   SARS Coronavirus 2 by RT PCR NEGATIVE NEGATIVE Final    Comment: (NOTE) SARS-CoV-2 target nucleic acids are NOT DETECTED.  The SARS-CoV-2 RNA is generally detectable in upper respiratory specimens during the acute phase of infection. The lowest concentration of SARS-CoV-2 viral copies this assay can detect is 138 copies/mL. A negative result does not preclude SARS-Cov-2 infection and should not be used as the sole basis for treatment or other patient management decisions. A negative result may occur with  improper specimen collection/handling, submission of specimen other than nasopharyngeal swab, presence of viral mutation(s) within the areas targeted by this assay, and inadequate number of viral copies(<138 copies/mL). A negative result must be combined with clinical observations, patient history, and epidemiological information. The expected result is Negative.  Fact Sheet for Patients:  EntrepreneurPulse.com.au  Fact Sheet for Healthcare Providers:  IncredibleEmployment.be  This test is no t yet approved or cleared by the [place] FDA and  has been authorized for detection and/or diagnosis of SARS-CoV-2 by FDA under an Emergency Use Authorization (EUA). This EUA will remain  in effect (meaning this test can be used) for the duration of the COVID-19 declaration under Section 564(b)(1) of the Act, 21 U.S.C.section 360bbb-3(b)(1), unless the authorization is terminated  or revoked sooner.       Influenza A by PCR NEGATIVE NEGATIVE Final   Influenza B by PCR NEGATIVE NEGATIVE Final    Comment: (NOTE) The Xpert Xpress SARS-CoV-2/FLU/RSV plus assay is intended as an aid in the diagnosis of influenza from Nasopharyngeal swab specimens and should not be used as a sole basis for treatment. Nasal washings and aspirates are unacceptable for Xpert Xpress SARS-CoV-2/FLU/RSV testing.  Fact Sheet for  Patients: EntrepreneurPulse.com.au  Fact Sheet for Healthcare Providers: IncredibleEmployment.be  This test is not yet approved or cleared by the [place] FDA and has been authorized for detection and/or diagnosis of SARS-CoV-2 by FDA under an Emergency Use Authorization (EUA). This EUA will  remain in effect (meaning this test can be used) for the duration of the COVID-19 declaration under Section 564(b)(1) of the Act, 21 U.S.C. section 360bbb-3(b)(1), unless the authorization is terminated or revoked.  Performed at [facility], [address] [place], [place] [address]   Gastrointestinal Panel by PCR , Stool     Status: None   Collection Time: 08/14/20  4:04 PM   Specimen: Stool  Result Value Ref Range Status   Campylobacter species NOT DETECTED NOT DETECTED Final   Plesimonas shigelloides NOT DETECTED NOT DETECTED Final   Salmonella species NOT DETECTED NOT DETECTED Final   Yersinia enterocolitica NOT DETECTED NOT DETECTED Final   Vibrio species NOT DETECTED NOT DETECTED Final   Vibrio cholerae NOT DETECTED NOT DETECTED Final   Enteroaggregative E coli (EAEC) NOT DETECTED NOT DETECTED Final   Enteropathogenic E coli (EPEC) NOT DETECTED NOT DETECTED Final   Enterotoxigenic E coli (ETEC) NOT DETECTED NOT DETECTED Final   Shiga like toxin producing E coli (STEC) NOT DETECTED NOT DETECTED Final   Shigella/Enteroinvasive E coli (EIEC) NOT DETECTED NOT DETECTED Final   Cryptosporidium NOT DETECTED NOT DETECTED Final   Cyclospora cayetanensis NOT DETECTED NOT DETECTED Final   Entamoeba histolytica NOT DETECTED NOT DETECTED Final   Giardia lamblia NOT DETECTED NOT DETECTED Final   Adenovirus F40/41 NOT DETECTED NOT DETECTED Final   Astrovirus NOT DETECTED NOT DETECTED Final   Norovirus GI/GII NOT DETECTED NOT DETECTED Final   Rotavirus A NOT DETECTED NOT DETECTED Final   Sapovirus (I, II, IV, and V) NOT DETECTED NOT DETECTED Final    Comment:  Performed at [facility], [address] [place], [place] [address]  Culture, blood (x 2)     Status: None   Collection Time: 08/14/20  8:29 PM   Specimen: BLOOD RIGHT HAND  Result Value Ref Range Status   Specimen Description BLOOD RIGHT HAND  Final   Special Requests   Final    BOTTLES DRAWN AEROBIC AND ANAEROBIC Blood Culture adequate volume   Culture   Final    NO GROWTH 5 DAYS Performed at [facility], [address] [place], [place] [address]    Report Status 08/19/2020 FINAL  Final  Culture, blood (x 2)     Status: None   Collection Time: 08/14/20  8:39 PM   Specimen: BLOOD LEFT HAND  Result Value Ref Range Status   Specimen Description BLOOD LEFT HAND  Final   Special Requests   Final    BOTTLES DRAWN AEROBIC AND ANAEROBIC Blood Culture adequate volume   Culture   Final    NO GROWTH 5 DAYS Performed at [facility], [address], [place], [place] [address]    Report Status 08/19/2020 FINAL  Final  C Difficile Quick Screen w PCR reflex     Status: None   Collection Time: 08/16/20 10:00 PM   Specimen: STOOL  Result Value Ref Range Status   C Diff antigen NEGATIVE NEGATIVE Final   C Diff toxin NEGATIVE NEGATIVE Final   C Diff interpretation No C. difficile detected.  Final    Comment: Performed at [facility], [address] [place], [place] [address]  Culture, blood (routine x 2)     Status: None (Preliminary result)   Collection Time: 08/19/20 11:26 AM   Specimen: BLOOD  Result Value Ref Range Status   Specimen Description BLOOD BLOOD LEFT HAND  Final   Special Requests   Final    Blood Culture adequate  volume BOTTLES DRAWN AEROBIC AND ANAEROBIC   Culture   Final    NO GROWTH 2 DAYS Performed at [facility], [address] [place], [place] [address]    Report Status PENDING  Incomplete  Culture, blood (routine x 2)     Status: None (Preliminary result)   Collection Time: 08/19/20 11:26 AM   Specimen: BLOOD  Result Value Ref Range Status    Specimen Description BLOOD LEFT ANTECUBITAL  Final   Special Requests   Final    Blood Culture adequate volume BOTTLES DRAWN AEROBIC AND ANAEROBIC   Culture   Final    NO GROWTH 2 DAYS Performed at [facility], [address] [place], [place] [address]    Report Status PENDING  Incomplete  MRSA Next Gen by PCR, Nasal     Status: None   Collection Time: 08/19/20 11:49 AM  Result Value Ref Range Status   MRSA by PCR Next Gen NOT DETECTED NOT DETECTED Final    Comment: (NOTE) The GeneXpert MRSA Assay (FDA approved for NASAL specimens only), is one component of a comprehensive MRSA colonization surveillance program. It is not intended to diagnose MRSA infection nor to guide or monitor treatment for MRSA infections. Test performance is not FDA approved in patients less than 78 years old. Performed at [facility], [address] [place], [place] [address]          Radiology Studies: ECHOCARDIOGRAM COMPLETE  Result Date: 08/20/2020    ECHOCARDIOGRAM REPORT   Patient Name:   Acey Lav Date of Exam: 08/20/2020 Medical Rec #:  [id number]        Height:       66.0 in Accession #:    [id number]       Weight:       135.4 lb Date of Birth:  October 08, 1945       BSA:          1.694 m Patient Age:    56 years         BP:           123/70 mmHg Patient Gender: F                HR:           65 bpm. Exam Location:  [facility] Procedure: 2D Echo, Cardiac Doppler and Color Doppler Indications:    Acute respiratory distress  History:        Patient has no prior history of Echocardiogram examinations.                 Signs/Symptoms:Altered Mental Status and Shortness of Breath;                 Risk Factors:Hypertension. Acute resp. failure, Parkinsons,                 bilateral pleural effusions.  Sonographer:    Dustin Flock RDCS Referring Phys: [id number] Royanne Foots [place]  1. Large area of ? pleural effusion with atelecttic lung, ? ascites and hepatomegaly noted.  2. Abnormal septal motion .  Left ventricular ejection fraction, by estimation, is 50 to 55%. The left ventricle has low normal function. The left ventricle has no regional wall motion abnormalities. Left ventricular diastolic parameters were normal.  3. Right ventricular systolic function is normal. The right ventricular size is normal. There is normal pulmonary artery systolic pressure.  4. The pericardial effusion is posterior to the left ventricle and lateral to the  left ventricle.  5. The mitral valve is normal in structure. Trivial mitral valve regurgitation. No evidence of mitral stenosis.  6. The aortic valve is tricuspid. Aortic valve regurgitation is trivial. Mild to moderate aortic valve sclerosis/calcification is present, without any evidence of aortic stenosis.  7. The inferior vena cava is normal in size with greater than 50% respiratory variability, suggesting right atrial pressure of 3 mmHg. FINDINGS  Left Ventricle: Abnormal septal motion. Left ventricular ejection fraction, by estimation, is 50 to 55%. The left ventricle has low normal function. The left ventricle has no regional wall motion abnormalities. The left ventricular internal cavity size was normal in size. There is no left ventricular hypertrophy. Left ventricular diastolic parameters were normal. Right Ventricle: The right ventricular size is normal. No increase in right ventricular wall thickness. Right ventricular systolic function is normal. There is normal pulmonary artery systolic pressure. The tricuspid regurgitant velocity is 2.54 m/s, and  with an assumed right atrial pressure of 3 mmHg, the estimated right ventricular systolic pressure is 40.9 mmHg. Left Atrium: Left atrial size was normal in size. Right Atrium: Right atrial size was normal in size. Pericardium: Trivial pericardial effusion is present. The pericardial effusion is posterior to the left ventricle and lateral to the left ventricle. Mitral Valve: The mitral valve is normal in structure.  Trivial mitral valve regurgitation. No evidence of mitral valve stenosis. Tricuspid Valve: The tricuspid valve is normal in structure. Tricuspid valve regurgitation is not demonstrated. No evidence of tricuspid stenosis. Aortic Valve: The aortic valve is tricuspid. Aortic valve regurgitation is trivial. Aortic regurgitation PHT measures 619 msec. Mild to moderate aortic valve sclerosis/calcification is present, without any evidence of aortic stenosis. Pulmonic Valve: The pulmonic valve was normal in structure. Pulmonic valve regurgitation is not visualized. No evidence of pulmonic stenosis. Aorta: The aortic root is normal in size and structure. Venous: The inferior vena cava is normal in size with greater than 50% respiratory variability, suggesting right atrial pressure of 3 mmHg. IAS/Shunts: The interatrial septum was not well visualized. Additional Comments: Large area of ? pleural effusion with atelecttic lung, ? ascites and hepatomegaly noted.  LEFT VENTRICLE PLAX 2D LVIDd:         4.55 cm  Diastology LVIDs:         3.04 cm  LV e' medial:    6.75 cm/s LV PW:         1.12 cm  LV E/e' medial:  7.7 LV IVS:        0.91 cm  LV e' lateral:   12.70 cm/s LVOT diam:     1.90 cm  LV E/e' lateral: 4.1 LV SV:         51 LV SV Index:   30 LVOT Area:     2.84 cm  RIGHT VENTRICLE RV Basal diam:  2.30 cm RV S prime:     10.60 cm/s TAPSE (M-mode): 1.7 cm LEFT ATRIUM           Index       RIGHT ATRIUM           Index LA diam:      2.70 cm 1.59 cm/m  RA Area:     10.10 cm LA Vol (A2C): 14.0 ml 8.26 ml/m  RA Volume:   19.30 ml  11.39 ml/m LA Vol (A4C): 40.2 ml 23.73 ml/m  AORTIC VALVE LVOT Vmax:   80.90 cm/s LVOT Vmean:  56.500 cm/s LVOT VTI:    0.181 m AI PHT:  619 msec  AORTA Ao Root diam: 3.00 cm MITRAL VALVE               TRICUSPID VALVE MV Area (PHT): 3.43 cm    TR Peak grad:   25.8 mmHg MV Decel Time: 221 msec    TR Vmax:        254.00 cm/s MV E velocity: 52.00 cm/s MV A velocity: 79.00 cm/s  SHUNTS MV E/A ratio:   0.66        Systemic VTI:  0.18 m                            Systemic Diam: 1.90 cm Jenkins Rouge MD Electronically signed by Jenkins Rouge MD Signature Date/Time: 08/20/2020/4:18:04 PM    Final         Scheduled Meds:  atenolol  25 mg Oral Daily   carbidopa-levodopa  2 tablet Oral TID   Chlorhexidine Gluconate Cloth  6 each Topical Daily   feeding supplement  237 mL Oral TID BM   flumazenil  0.5 mg Intravenous Once   pantoprazole  40 mg Oral Daily   saccharomyces boulardii  250 mg Oral BID   sodium bicarbonate  650 mg Oral BID     LOS: 6 days    Time spent: 35 minutes    Ajay Strubel Darleen Crocker, DO [facility]  If 7PM-7AM, please contact night-coverage [email or web address] 08/21/2020, 12:34 PM

## 2020-08-21 NOTE — Progress Notes (Signed)
Patient unable to take PO medications at this time. Patient is too drowsy. Do not feel safe giving her these medications due to risk for aspiration. Will continue to monitor.

## 2020-08-21 NOTE — Progress Notes (Signed)
Patient husband at bedside. He stated that patient has been unable to eat today or take any medications. Said patient hasn't spoken that she will just make noises and open her eyes.

## 2020-08-21 NOTE — Progress Notes (Signed)
Physical Therapy Treatment Patient Details Name: Deborah [name]. Deborah [name] MRN: [id number] DOB: 1945-06-16 Today's Date: 08/21/2020    History of Present Illness Deborah [name]  is a 75 y.o. female, with past medical history of Parkinson, depression, anxiety, GERD and hypertension, patient presents to ED secondary to multiple complaints, including nausea, vomiting, weakness and diarrhea, reports symptoms started 2 weeks ago after they ate in a steak place, reports both she and her husband he felt sick, but got better in few days, but her symptoms has persisted for last 2 weeks, reports nausea, poor appetite, initially some vomiting but none recently, still reports diarrhea 2-3 loose bowel movements per day, which prompted her to come to ED for fever, chills, blood in stools, coffee-ground emesis, no chest pain or shortness of breath.  -In ED her work-up significant for sodium of 125, potassium of 3.1, AST of 164, bicarb of 19, white blood cell count of 2, and platelet count of 62,000, [facility] hospitalist consulted to admit.    PT Comments    Patient presents very lethargic with difficulty keeping eyes open, requires Max assist to sit up at bedside with frequent falling backwards and unable to stand due to weakness.  Patient unable to grip with hands or lift BLE against gravity when attempting exercises.  Patient will benefit from continued physical therapy in hospital and recommended venue below to increase strength, balance, endurance for safe ADLs and gait.     Follow Up Recommendations  SNF     Equipment Recommendations  Rolling walker with 5" wheels    Recommendations for Other Services       Precautions / Restrictions Precautions Precautions: Fall Restrictions Weight Bearing Restrictions: No    Mobility  Bed Mobility Overal bed mobility: Needs Assistance Bed Mobility: Supine to Sit;Sit to Supine     Supine to sit: Max assist Sit to supine: Max assist   General bed mobility comments:  slow labored movement,  unable to grip with hands    Transfers                    Ambulation/Gait                 Stairs             Wheelchair Mobility    Modified Rankin (Stroke Patients Only)       Balance Overall balance assessment: Needs assistance Sitting-balance support: Feet supported;Bilateral upper extremity supported Sitting balance-Leahy Scale: Poor Sitting balance - Comments: frequent falling backwards while seated at EOB Postural control: Posterior lean                                  Cognition Arousal/Alertness: Lethargic Behavior During Therapy: WFL for tasks assessed/performed Overall Cognitive Status: Within Functional Limits for tasks assessed                                 General Comments: very lethargic      Exercises General Exercises - Lower Extremity Long Arc Quad: Seated;Strengthening;Both;AAROM;10 reps Hip Flexion/Marching: Seated;AAROM;Strengthening;Both;10 reps    General Comments        Pertinent Vitals/Pain Pain Assessment: Faces Faces Pain Scale: No hurt    Home Living                      Prior Function  PT Goals (current goals can now be found in the care plan section) Acute Rehab PT Goals Patient Stated Goal: return home with family to assist PT Goal Formulation: With patient/family Time For Goal Achievement: 08/29/20 Potential to Achieve Goals: Fair Progress towards PT goals: Progressing toward goals    Frequency    Min 3X/week      PT Plan Current plan remains appropriate    Co-evaluation              AM-PAC PT "6 Clicks" Mobility   Outcome Measure  Help needed turning from your back to your side while in a flat bed without using bedrails?: A Lot Help needed moving from lying on your back to sitting on the side of a flat bed without using bedrails?: A Lot Help needed moving to and from a bed to a chair (including a  wheelchair)?: Total Help needed standing up from a chair using your arms (e.g., wheelchair or bedside chair)?: Total Help needed to walk in hospital room?: Total Help needed climbing 3-5 steps with a railing? : Total 6 Click Score: 8    End of Session   Activity Tolerance: Patient tolerated treatment well;Patient limited by fatigue;Patient limited by lethargy Patient left: in bed;with call bell/phone within reach;with family/visitor present Nurse Communication: Mobility status PT Visit Diagnosis: Unsteadiness on feet (R26.81);Other abnormalities of gait and mobility (R26.89);Muscle weakness (generalized) (M62.81)     Time: 1751-0258 PT Time Calculation (min) (ACUTE ONLY): 20 min  Charges:  $Therapeutic Activity: 8-22 mins                     3:42 PM, 08/21/20 Lonell Grandchild, MPT Physical Therapist with [facility] [phone] office [phone] mobile phone

## 2020-08-22 ENCOUNTER — Inpatient Hospital Stay (HOSPITAL_COMMUNITY)
Admit: 2020-08-22 | Discharge: 2020-08-22 | Disposition: A | Discharge status: Home / Self Care | Payer: Medicare Other | Attending: Internal Medicine | Admitting: Internal Medicine

## 2020-08-22 ENCOUNTER — Inpatient Hospital Stay (HOSPITAL_COMMUNITY): Payer: Medicare Other

## 2020-08-22 DIAGNOSIS — R4182 Altered mental status, unspecified: Secondary | ICD-10-CM

## 2020-08-22 LAB — COMPREHENSIVE METABOLIC PANEL
ALT: 44 U/L (ref 0–44)
AST: 167 U/L — ABNORMAL HIGH (ref 15–41)
Albumin: 1.8 g/dL — ABNORMAL LOW (ref 3.5–5.0)
Alkaline Phosphatase: 370 U/L — ABNORMAL HIGH (ref 38–126)
Anion gap: 4 — ABNORMAL LOW (ref 5–15)
BUN: 29 mg/dL — ABNORMAL HIGH (ref 8–23)
CO2: 19 mmol/L — ABNORMAL LOW (ref 22–32)
Calcium: 7.3 mg/dL — ABNORMAL LOW (ref 8.9–10.3)
Chloride: 115 mmol/L — ABNORMAL HIGH (ref 98–111)
Creatinine, Ser: 1.2 mg/dL — ABNORMAL HIGH (ref 0.44–1.00)
GFR, Estimated: 48 mL/min — ABNORMAL LOW (ref >60)
Glucose, Bld: 83 mg/dL (ref 70–99)
Potassium: 3.6 mmol/L (ref 3.5–5.1)
Sodium: 138 mmol/L (ref 135–145)
Total Bilirubin: 0.8 mg/dL (ref 0.3–1.2)
Total Protein: 4.6 g/dL — ABNORMAL LOW (ref 6.5–8.1)

## 2020-08-22 LAB — CBC
HCT: 38.7 % (ref 36.0–46.0)
Hemoglobin: 13.2 g/dL (ref 12.0–15.0)
MCH: 30.3 pg (ref 26.0–34.0)
MCHC: 34.1 g/dL (ref 30.0–36.0)
MCV: 88.8 fL (ref 80.0–100.0)
Platelets: 72 10*3/uL — ABNORMAL LOW (ref 150–400)
RBC: 4.36 MIL/uL (ref 3.87–5.11)
RDW: 16.1 % — ABNORMAL HIGH (ref 11.5–15.5)
WBC: 7.2 10*3/uL (ref 4.0–10.5)
nRBC: 0 % (ref 0.0–0.2)

## 2020-08-22 LAB — MAGNESIUM: Magnesium: 2.2 mg/dL (ref 1.7–2.4)

## 2020-08-22 MED ORDER — ACETAMINOPHEN 160 MG/5ML PO SOLN
650.0000 mg | Freq: Four times a day (QID) | ORAL | Status: DC | PRN
  Administered 2020-08-22 – 2020-08-24 (×2): 650 mg via ORAL
  Filled 2020-08-22 (×2): qty 20.3

## 2020-08-22 NOTE — Progress Notes (Signed)
EEG Completed; Results Pending  

## 2020-08-22 NOTE — Progress Notes (Signed)
PROGRESS NOTE    Deborah [name]. Agrawal  XFG:[id number] DOB: 1945/07/14 DOA: 08/14/2020 PCP: Loman Brooklyn, FNP   Brief Narrative:   Charise Leinbach  is a 75 y.o. female, with past medical history of Parkinson, depression, anxiety, GERD and hypertension, patient presents to ED secondary to multiple complaints, including nausea, vomiting, weakness and diarrhea, reports symptoms started 2 weeks ago after they ate in a steak place.  She was admitted with SIRS criteria as well as [name] and electrolyte abnormalities to include hypocalcemia.  Continues to have ongoing weakness and confusion with poor appetite.  Assessment & Plan:   Active Problems:   GERD (gastroesophageal reflux disease)   Generalized anxiety disorder   Parkinson disease ([place])   Hyponatremia   SIRS (systemic inflammatory response syndrome) (HCC)   Acute metabolic encephalopathy-multifactorial -Likely related to potential infection, [name], and significant electrolyte abnormalities in the setting of parkinsonism -Plan to trial Romazicon 6/16 due to suspicion of benzodiazepine overdose -Continues with weakness and poor oral intake -PT evaluation recommending SNF -Plan to check brain MRI and EEG today due to persistent encephalopathic state   SIRS-resolved -Discontinue further antibiotics -Procalcitonin stable -No significant fevers noted, repeat blood cultures -No signs of overt infection noted   Concern for tick bite -Check Lyme and RMSF titers, pending   Mild dyspnea with bilateral pleural effusions-resolved -No overt need for Lasix at this time, continue to monitor -Currently without hypoxemia   [name] with metabolic acidosis-stable -Started on sodium bicarbonate -IV fluids to be given with some D5 due to poor oral intake -Check urine electrolytes; renal ultrasound without any obvious hydronephrosis -Follow a.m. labs   Hypokalemia/hypomagnesemia/hypocalcemia-resolved -Monitor in a.m.   Diarrhea-resolved -Negative C.  Difficile -DC further antibiotics 6/15   Parkinsonism -Continue Sinemet   Thrombocytopenia-stable -Currently stable, continue to monitor -Avoid heparin agents -Follow-up with hematology outpatient   DVT prophylaxis: SCDs Code Status: Full Family Communication: Husband, Doug at bedside 6/17 Disposition Plan:  Status is: Inpatient   Remains inpatient appropriate because:Persistent severe electrolyte disturbances, Altered mental status, Ongoing diagnostic testing needed not appropriate for outpatient work up, and IV treatments appropriate due to intensity of illness or inability to take PO   Dispo: The patient is from: Home              Anticipated d/c is to: SNF              Patient currently is not medically stable to d/c.              Difficult to place patient No     Consultants:  None   Procedures:  See below  Antimicrobials:  Anti-infectives (From admission, onward)    Start     Dose/Rate Route Frequency Ordered Stop   08/19/20 2015  ceFEPIme (MAXIPIME) 2 g in sodium chloride 0.9 % 100 mL IVPB  Status:  Discontinued        2 g 200 mL/hr over 30 Minutes Intravenous Every 24 hours 08/19/20 0751 08/20/20 0938   08/15/20 2000  vancomycin (VANCOCIN) IVPB 1000 mg/200 mL premix  Status:  Discontinued       See Hyperspace for full Linked Orders Report.   1,000 mg 200 mL/hr over 60 Minutes Intravenous Every 24 hours 08/14/20 1911 08/18/20 0705   08/14/20 2200  ceFEPIme (MAXIPIME) 2 g in sodium chloride 0.9 % 100 mL IVPB  Status:  Discontinued        2 g 200 mL/hr over 30 Minutes Intravenous Every 12 hours 08/14/20  1905 08/14/20 2008   08/14/20 2100  ceFEPIme (MAXIPIME) 2 g in sodium chloride 0.9 % 100 mL IVPB  Status:  Discontinued        2 g 200 mL/hr over 30 Minutes Intravenous Every 12 hours 08/14/20 2008 08/14/20 2015   08/14/20 2015  ceFEPIme (MAXIPIME) 2 g in sodium chloride 0.9 % 100 mL IVPB  Status:  Discontinued        2 g 200 mL/hr over 30 Minutes Intravenous  Every 12 hours 08/14/20 2014 08/19/20 0751   08/14/20 2000  metroNIDAZOLE (FLAGYL) IVPB 500 mg  Status:  Discontinued        500 mg 100 mL/hr over 60 Minutes Intravenous Every 8 hours 08/14/20 1900 08/20/20 0938   08/14/20 2000  vancomycin (VANCOREADY) IVPB 1250 mg/250 mL       See Hyperspace for full Linked Orders Report.   1,250 mg 166.7 mL/hr over 90 Minutes Intravenous  Once 08/14/20 1911 08/14/20 2237       Subjective: Patient seen and evaluated today with increased alertness noted however she is still not eating and is nonverbal.  Objective: Vitals:   08/21/20 0404 08/21/20 1112 08/21/20 1323 08/21/20 2142  BP: 134/76  137/79 (!) 151/85  Pulse: 67  69 63  Resp: 18   20  Temp: 98.6 F (37 C) 98.9 F (37.2 C) 98.6 F (37 C) 98.1 F (36.7 C)  TempSrc: Oral  Oral Oral  SpO2: 100%  96% 96%  Weight:      Height:        Intake/Output Summary (Last 24 hours) at 08/22/2020 1200 Last data filed at 08/22/2020 0900 Gross per 24 hour  Intake 0 ml  Output 1350 ml  Net -1350 ml   Filed Weights   08/14/20 0949 08/14/20 1851  Weight: 55.3 kg 61.4 kg    Examination:  General exam: Appears calm and comfortable, more alert-nonverbal Respiratory system: Clear to auscultation. Respiratory effort normal. Cardiovascular system: S1 & S2 heard, RRR.  Gastrointestinal system: Abdomen is soft Central nervous system: Alert and awake Extremities: No edema Skin: No significant lesions noted Psychiatry: Flat affect.    Data Reviewed: I have personally reviewed following labs and imaging studies  CBC: Recent Labs  Lab 08/17/20 1514 08/18/20 0603 08/19/20 0503 08/20/20 0516 08/21/20 0510 08/22/20 0638  WBC 4.6 7.5 7.7 6.1 5.6 7.2  NEUTROABS 4.1 6.6  --   --   --   --   HGB 13.8 13.8 12.7 10.5* 12.1 13.2  HCT 38.7 40.8 36.6 30.6* 35.7* 38.7  MCV 88.0 91.3 89.5 91.3 89.7 88.8  PLT 49* 46* 53* 44* 63* 72*   Basic Metabolic Panel: Recent Labs  Lab 08/17/20 1514  08/18/20 0603 08/19/20 0503 08/20/20 0516 08/21/20 0510 08/22/20 0638  NA 126* 127* 129* 134* 136 138  K 4.3 5.4* 4.4 3.0* 3.7 3.6  CL 107 108 110 116* 116* 115*  CO2 13* 13* 15* 13* 18* 19*  GLUCOSE 101* 83 84 68* 215* 83  BUN 28* 34* 37* 30* 30* 29*  CREATININE 1.22* 1.50* 1.68* 1.32* 1.36* 1.20*  CALCIUM 6.5* 6.2* 6.3* 5.1* 7.0* 7.3*  MG 1.7  --  1.9 1.5* 2.1 2.2  PHOS  --   --   --  4.6  --   --    GFR: Estimated Creatinine Clearance: 39.1 mL/min (A) (by C-G formula based on SCr of 1.2 mg/dL (H)). Liver Function Tests: Recent Labs  Lab 08/22/20 0638  AST 167*  ALT  44  ALKPHOS 370*  BILITOT 0.8  PROT 4.6*  ALBUMIN 1.8*   No results for input(s): LIPASE, AMYLASE in the last 168 hours. Recent Labs  Lab 08/19/20 1126  AMMONIA 28   Coagulation Profile: No results for input(s): INR, PROTIME in the last 168 hours. Cardiac Enzymes: No results for input(s): CKTOTAL, CKMB, CKMBINDEX, TROPONINI in the last 168 hours. BNP (last 3 results) No results for input(s): PROBNP in the last 8760 hours. HbA1C: No results for input(s): HGBA1C in the last 72 hours. CBG: Recent Labs  Lab 08/20/20 0817  GLUCAP 124*   Lipid Profile: No results for input(s): CHOL, HDL, LDLCALC, TRIG, CHOLHDL, LDLDIRECT in the last 72 hours. Thyroid Function Tests: No results for input(s): TSH, T4TOTAL, FREET4, T3FREE, THYROIDAB in the last 72 hours. Anemia Panel: No results for input(s): VITAMINB12, FOLATE, FERRITIN, TIBC, IRON, RETICCTPCT in the last 72 hours. Sepsis Labs: Recent Labs  Lab 08/18/20 0603 08/19/20 0503 08/20/20 0516 08/21/20 0510  PROCALCITON 4.32 3.12 1.68 1.80    Recent Results (from the past 240 hour(s))  Resp Panel by RT-PCR (Flu A&B, Covid) Nasopharyngeal Swab     Status: None   Collection Time: 08/14/20  2:22 PM   Specimen: Nasopharyngeal Swab; Nasopharyngeal(NP) swabs in vial transport medium  Result Value Ref Range Status   SARS Coronavirus 2 by RT PCR NEGATIVE  NEGATIVE Final    Comment: (NOTE) SARS-CoV-2 target nucleic acids are NOT DETECTED.  The SARS-CoV-2 RNA is generally detectable in upper respiratory specimens during the acute phase of infection. The lowest concentration of SARS-CoV-2 viral copies this assay can detect is 138 copies/mL. A negative result does not preclude SARS-Cov-2 infection and should not be used as the sole basis for treatment or other patient management decisions. A negative result may occur with  improper specimen collection/handling, submission of specimen other than nasopharyngeal swab, presence of viral mutation(s) within the areas targeted by this assay, and inadequate number of viral copies(<138 copies/mL). A negative result must be combined with clinical observations, patient history, and epidemiological information. The expected result is Negative.  Fact Sheet for Patients:  EntrepreneurPulse.com.au  Fact Sheet for Healthcare Providers:  IncredibleEmployment.be  This test is no t yet approved or cleared by the [place] FDA and  has been authorized for detection and/or diagnosis of SARS-CoV-2 by FDA under an Emergency Use Authorization (EUA). This EUA will remain  in effect (meaning this test can be used) for the duration of the COVID-19 declaration under Section 564(b)(1) of the Act, 21 U.S.C.section 360bbb-3(b)(1), unless the authorization is terminated  or revoked sooner.       Influenza A by PCR NEGATIVE NEGATIVE Final   Influenza B by PCR NEGATIVE NEGATIVE Final    Comment: (NOTE) The Xpert Xpress SARS-CoV-2/FLU/RSV plus assay is intended as an aid in the diagnosis of influenza from Nasopharyngeal swab specimens and should not be used as a sole basis for treatment. Nasal washings and aspirates are unacceptable for Xpert Xpress SARS-CoV-2/FLU/RSV testing.  Fact Sheet for Patients: EntrepreneurPulse.com.au  Fact Sheet for Healthcare  Providers: IncredibleEmployment.be  This test is not yet approved or cleared by the [place] FDA and has been authorized for detection and/or diagnosis of SARS-CoV-2 by FDA under an Emergency Use Authorization (EUA). This EUA will remain in effect (meaning this test can be used) for the duration of the COVID-19 declaration under Section 564(b)(1) of the Act, 21 U.S.C. section 360bbb-3(b)(1), unless the authorization is terminated or revoked.  Performed at [facility]  [facility], [address], [place], [place] [address]   Gastrointestinal Panel by PCR , Stool     Status: None   Collection Time: 08/14/20  4:04 PM   Specimen: Stool  Result Value Ref Range Status   Campylobacter species NOT DETECTED NOT DETECTED Final   Plesimonas shigelloides NOT DETECTED NOT DETECTED Final   Salmonella species NOT DETECTED NOT DETECTED Final   Yersinia enterocolitica NOT DETECTED NOT DETECTED Final   Vibrio species NOT DETECTED NOT DETECTED Final   Vibrio cholerae NOT DETECTED NOT DETECTED Final   Enteroaggregative E coli (EAEC) NOT DETECTED NOT DETECTED Final   Enteropathogenic E coli (EPEC) NOT DETECTED NOT DETECTED Final   Enterotoxigenic E coli (ETEC) NOT DETECTED NOT DETECTED Final   Shiga like toxin producing E coli (STEC) NOT DETECTED NOT DETECTED Final   Shigella/Enteroinvasive E coli (EIEC) NOT DETECTED NOT DETECTED Final   Cryptosporidium NOT DETECTED NOT DETECTED Final   Cyclospora cayetanensis NOT DETECTED NOT DETECTED Final   Entamoeba histolytica NOT DETECTED NOT DETECTED Final   Giardia lamblia NOT DETECTED NOT DETECTED Final   Adenovirus F40/41 NOT DETECTED NOT DETECTED Final   Astrovirus NOT DETECTED NOT DETECTED Final   Norovirus GI/GII NOT DETECTED NOT DETECTED Final   Rotavirus A NOT DETECTED NOT DETECTED Final   Sapovirus (I, II, IV, and V) NOT DETECTED NOT DETECTED Final    Comment: Performed at [facility], [address] [place], [place] [address]   Culture, blood (x 2)     Status: None   Collection Time: 08/14/20  8:29 PM   Specimen: BLOOD RIGHT HAND  Result Value Ref Range Status   Specimen Description BLOOD RIGHT HAND  Final   Special Requests   Final    BOTTLES DRAWN AEROBIC AND ANAEROBIC Blood Culture adequate volume   Culture   Final    NO GROWTH 5 DAYS Performed at [facility], [address] [place], [place] [address]    Report Status 08/19/2020 FINAL  Final  Culture, blood (x 2)     Status: None   Collection Time: 08/14/20  8:39 PM   Specimen: BLOOD LEFT HAND  Result Value Ref Range Status   Specimen Description BLOOD LEFT HAND  Final   Special Requests   Final    BOTTLES DRAWN AEROBIC AND ANAEROBIC Blood Culture adequate volume   Culture   Final    NO GROWTH 5 DAYS Performed at [facility], [address] [place], [place] [address]    Report Status 08/19/2020 FINAL  Final  C Difficile Quick Screen w PCR reflex     Status: None   Collection Time: 08/16/20 10:00 PM   Specimen: STOOL  Result Value Ref Range Status   C Diff antigen NEGATIVE NEGATIVE Final   C Diff toxin NEGATIVE NEGATIVE Final   C Diff interpretation No C. difficile detected.  Final    Comment: Performed at [facility], [address], [place], [place] [address]  Culture, blood (routine x 2)     Status: None (Preliminary result)   Collection Time: 08/19/20 11:26 AM   Specimen: BLOOD  Result Value Ref Range Status   Specimen Description BLOOD BLOOD LEFT HAND  Final   Special Requests   Final    Blood Culture adequate volume BOTTLES DRAWN AEROBIC AND ANAEROBIC   Culture   Final    NO GROWTH 3 DAYS Performed at [facility], [address] [place], [place] [address]    Report Status PENDING  Incomplete  Culture, blood (routine x 2)     Status: None (Preliminary result)   Collection Time: 08/19/20 11:26 AM   Specimen: BLOOD  Result Value Ref Range Status   Specimen Description BLOOD LEFT ANTECUBITAL  Final   Special Requests   Final     Blood Culture adequate volume BOTTLES DRAWN AEROBIC AND ANAEROBIC   Culture   Final    NO GROWTH 3 DAYS Performed at [facility], [address], [place], [place] [address]    Report Status PENDING  Incomplete  MRSA Next Gen by PCR, Nasal     Status: None   Collection Time: 08/19/20 11:49 AM  Result Value Ref Range Status   MRSA by PCR Next Gen NOT DETECTED NOT DETECTED Final    Comment: (NOTE) The GeneXpert MRSA Assay (FDA approved for NASAL specimens only), is one component of a comprehensive MRSA colonization surveillance program. It is not intended to diagnose MRSA infection nor to guide or monitor treatment for MRSA infections. Test performance is not FDA approved in patients less than 6 years old. Performed at [facility], [address] [place], [place] [address]          Radiology Studies: DG Chest [facility] 1 View  Result Date: 08/21/2020 CLINICAL DATA:  Worsening shortness of breath, history of lung cancer post RIGHT lobectomy, Parkinson's, former smoker, hiatal hernia, asthma EXAM: PORTABLE CHEST 1 VIEW COMPARISON:  Portable exam 1300 hours compared to 08/18/2020 FINDINGS: Normal heart size, mediastinal contours, and pulmonary vascularity. Atelectasis versus consolidation LEFT lower lobe, increased. Mild RIGHT basilar atelectasis and probable small RIGHT pleural effusion. Upper lungs clear. No pneumothorax or acute osseous findings. IMPRESSION: Increased atelectasis versus consolidation LEFT lower lobe. RIGHT basilar atelectasis and probable small RIGHT pleural effusion. Electronically Signed   By: Lavonia Dana M.D.   On: 08/21/2020 14:39   ECHOCARDIOGRAM COMPLETE  Result Date: 08/20/2020    ECHOCARDIOGRAM REPORT   Patient Name:   Acey Lav Date of Exam: 08/20/2020 Medical Rec #:  [id number]        Height:       66.0 in Accession #:    [id number]       Weight:       135.4 lb Date of Birth:  October 04, 1945       BSA:          1.694 m Patient Age:    25 years         BP:            123/70 mmHg Patient Gender: F                HR:           65 bpm. Exam Location:  [facility] Procedure: 2D Echo, Cardiac Doppler and Color Doppler Indications:    Acute respiratory distress  History:        Patient has no prior history of Echocardiogram examinations.                 Signs/Symptoms:Altered Mental Status and Shortness of Breath;                 Risk Factors:Hypertension. Acute resp. failure, Parkinsons,                 bilateral pleural effusions.  Sonographer:    Dustin Flock RDCS Referring Phys: [id number] Royanne Foots [place]  1. Large area of ? pleural effusion with atelecttic lung, ? ascites and hepatomegaly noted.  2.  Abnormal septal motion . Left ventricular ejection fraction, by estimation, is 50 to 55%. The left ventricle has low normal function. The left ventricle has no regional wall motion abnormalities. Left ventricular diastolic parameters were normal.  3. Right ventricular systolic function is normal. The right ventricular size is normal. There is normal pulmonary artery systolic pressure.  4. The pericardial effusion is posterior to the left ventricle and lateral to the left ventricle.  5. The mitral valve is normal in structure. Trivial mitral valve regurgitation. No evidence of mitral stenosis.  6. The aortic valve is tricuspid. Aortic valve regurgitation is trivial. Mild to moderate aortic valve sclerosis/calcification is present, without any evidence of aortic stenosis.  7. The inferior vena cava is normal in size with greater than 50% respiratory variability, suggesting right atrial pressure of 3 mmHg. FINDINGS  Left Ventricle: Abnormal septal motion. Left ventricular ejection fraction, by estimation, is 50 to 55%. The left ventricle has low normal function. The left ventricle has no regional wall motion abnormalities. The left ventricular internal cavity size was normal in size. There is no left ventricular hypertrophy. Left ventricular diastolic parameters were normal.  Right Ventricle: The right ventricular size is normal. No increase in right ventricular wall thickness. Right ventricular systolic function is normal. There is normal pulmonary artery systolic pressure. The tricuspid regurgitant velocity is 2.54 m/s, and  with an assumed right atrial pressure of 3 mmHg, the estimated right ventricular systolic pressure is 25.9 mmHg. Left Atrium: Left atrial size was normal in size. Right Atrium: Right atrial size was normal in size. Pericardium: Trivial pericardial effusion is present. The pericardial effusion is posterior to the left ventricle and lateral to the left ventricle. Mitral Valve: The mitral valve is normal in structure. Trivial mitral valve regurgitation. No evidence of mitral valve stenosis. Tricuspid Valve: The tricuspid valve is normal in structure. Tricuspid valve regurgitation is not demonstrated. No evidence of tricuspid stenosis. Aortic Valve: The aortic valve is tricuspid. Aortic valve regurgitation is trivial. Aortic regurgitation PHT measures 619 msec. Mild to moderate aortic valve sclerosis/calcification is present, without any evidence of aortic stenosis. Pulmonic Valve: The pulmonic valve was normal in structure. Pulmonic valve regurgitation is not visualized. No evidence of pulmonic stenosis. Aorta: The aortic root is normal in size and structure. Venous: The inferior vena cava is normal in size with greater than 50% respiratory variability, suggesting right atrial pressure of 3 mmHg. IAS/Shunts: The interatrial septum was not well visualized. Additional Comments: Large area of ? pleural effusion with atelecttic lung, ? ascites and hepatomegaly noted.  LEFT VENTRICLE PLAX 2D LVIDd:         4.55 cm  Diastology LVIDs:         3.04 cm  LV e' medial:    6.75 cm/s LV PW:         1.12 cm  LV E/e' medial:  7.7 LV IVS:        0.91 cm  LV e' lateral:   12.70 cm/s LVOT diam:     1.90 cm  LV E/e' lateral: 4.1 LV SV:         51 LV SV Index:   30 LVOT Area:     2.84  cm  RIGHT VENTRICLE RV Basal diam:  2.30 cm RV S prime:     10.60 cm/s TAPSE (M-mode): 1.7 cm LEFT ATRIUM           Index       RIGHT ATRIUM  Index LA diam:      2.70 cm 1.59 cm/m  RA Area:     10.10 cm LA Vol (A2C): 14.0 ml 8.26 ml/m  RA Volume:   19.30 ml  11.39 ml/m LA Vol (A4C): 40.2 ml 23.73 ml/m  AORTIC VALVE LVOT Vmax:   80.90 cm/s LVOT Vmean:  56.500 cm/s LVOT VTI:    0.181 m AI PHT:      619 msec  AORTA Ao Root diam: 3.00 cm MITRAL VALVE               TRICUSPID VALVE MV Area (PHT): 3.43 cm    TR Peak grad:   25.8 mmHg MV Decel Time: 221 msec    TR Vmax:        254.00 cm/s MV E velocity: 52.00 cm/s MV A velocity: 79.00 cm/s  SHUNTS MV E/A ratio:  0.66        Systemic VTI:  0.18 m                            Systemic Diam: 1.90 cm Jenkins Rouge MD Electronically signed by Jenkins Rouge MD Signature Date/Time: 08/20/2020/4:18:04 PM    Final         Scheduled Meds:  atenolol  25 mg Oral Daily   carbidopa-levodopa  2 tablet Oral TID   Chlorhexidine Gluconate Cloth  6 each Topical Daily   feeding supplement  237 mL Oral TID BM   pantoprazole  40 mg Oral Daily   saccharomyces boulardii  250 mg Oral BID   sodium bicarbonate  650 mg Oral BID     LOS: 7 days    Time spent: 35 minutes    Carollynn Pennywell Darleen Crocker, DO [facility]  If 7PM-7AM, please contact night-coverage [email or web address] 08/22/2020, 12:00 PM

## 2020-08-22 NOTE — Care Management Important Message (Signed)
Important Message  Patient Details  Name: Deborah [name] MRN: [id number] Date of Birth: 06-14-1945   Medicare Important Message Given:  Yes     Tommy Medal 08/22/2020, 12:54 PM

## 2020-08-22 NOTE — Procedures (Signed)
Patient Name: Deborah [name]  MRN: [id number]  Epilepsy Attending: Lora Havens  Referring Physician/Provider: Dr Heath Lark Date: 08/22/2020 Duration: 22.23 mins  Patient history: 74yo F with ams. EEG to evaluate for seizure  Level of alertness: Awake  AEDs during EEG study: None  Technical aspects: This EEG study was done with scalp electrodes positioned according to the 10-20 International system of electrode placement. Electrical activity was acquired at a sampling rate of 500Hz  and reviewed with a high frequency filter of 70Hz  and a low frequency filter of 1Hz . EEG data were recorded continuously and digitally stored.   Description: No posterior dominant rhythm was seen. EEG showed continuous generalized 5 to 6 Hz theta as well as intermittent 2-3Hz  delta slowing, at time with triphasic morphology.  Hyperventilation and photic stimulation were not performed.     ABNORMALITY - Continuous slow, generalized  IMPRESSION: This study is suggestive of moderate diffuse encephalopathy, nonspecific etiology but likely related to toxic-metabolic causes. No seizures or definite epileptiform discharges were seen throughout the recording.  Jasira Robinson Barbra Sarks

## 2020-08-23 LAB — COMPREHENSIVE METABOLIC PANEL
ALT: 27 U/L (ref 0–44)
AST: 138 U/L — ABNORMAL HIGH (ref 15–41)
Albumin: 1.9 g/dL — ABNORMAL LOW (ref 3.5–5.0)
Alkaline Phosphatase: 383 U/L — ABNORMAL HIGH (ref 38–126)
Anion gap: 5 (ref 5–15)
BUN: 28 mg/dL — ABNORMAL HIGH (ref 8–23)
CO2: 19 mmol/L — ABNORMAL LOW (ref 22–32)
Calcium: 7.3 mg/dL — ABNORMAL LOW (ref 8.9–10.3)
Chloride: 116 mmol/L — ABNORMAL HIGH (ref 98–111)
Creatinine, Ser: 1.07 mg/dL — ABNORMAL HIGH (ref 0.44–1.00)
GFR, Estimated: 55 mL/min — ABNORMAL LOW (ref >60)
Glucose, Bld: 102 mg/dL — ABNORMAL HIGH (ref 70–99)
Potassium: 3.6 mmol/L (ref 3.5–5.1)
Sodium: 140 mmol/L (ref 135–145)
Total Bilirubin: 0.8 mg/dL (ref 0.3–1.2)
Total Protein: 4.8 g/dL — ABNORMAL LOW (ref 6.5–8.1)

## 2020-08-23 LAB — CBC
HCT: 37.7 % (ref 36.0–46.0)
Hemoglobin: 13 g/dL (ref 12.0–15.0)
MCH: 30.6 pg (ref 26.0–34.0)
MCHC: 34.5 g/dL (ref 30.0–36.0)
MCV: 88.7 fL (ref 80.0–100.0)
Platelets: 85 10*3/uL — ABNORMAL LOW (ref 150–400)
RBC: 4.25 MIL/uL (ref 3.87–5.11)
RDW: 16.3 % — ABNORMAL HIGH (ref 11.5–15.5)
WBC: 8.2 10*3/uL (ref 4.0–10.5)
nRBC: 0 % (ref 0.0–0.2)

## 2020-08-23 LAB — ROCKY MTN SPOTTED FVR ABS PNL(IGG+IGM)
RMSF IgG: NEGATIVE
RMSF IgM: 0.28 index (ref 0.00–0.89)

## 2020-08-23 LAB — MAGNESIUM: Magnesium: 2.1 mg/dL (ref 1.7–2.4)

## 2020-08-23 NOTE — Progress Notes (Signed)
PROGRESS NOTE    Deborah [name]  OEV:[id number] DOB: 1945-07-27 DOA: 08/14/2020 PCP: Loman Brooklyn, FNP   Brief Narrative:   Deborah [name]  is a 75 y.o. female, with past medical history of Parkinson, depression, anxiety, GERD and hypertension, patient presents to ED secondary to multiple complaints, including nausea, vomiting, weakness and diarrhea, reports symptoms started 2 weeks ago after they ate in a steak place.  She was admitted with SIRS criteria as well as [name] and electrolyte abnormalities to include hypocalcemia.  Continues to have ongoing weakness and confusion with poor appetite-that is now improving.  Plan for discharge to SNF once eating well.  Assessment & Plan:   Active Problems:   GERD (gastroesophageal reflux disease)   Generalized anxiety disorder   Parkinson disease (HCC)   Hyponatremia   SIRS (systemic inflammatory response syndrome) (HCC)   Acute metabolic encephalopathy-multifactorial-improving -Likely related to potential infection, [name], and significant electrolyte abnormalities in the setting of parkinsonism -Plan to trial Romazicon 6/16 due to suspicion of benzodiazepine overdose with improvement noted -Continues with weakness and poor oral intake -PT evaluation recommending SNF -Brain MRI EEG without any acute findings on 6/17 -Continue to see if dietary advancement will be tolerated prior to discharge to SNF   SIRS-resolved -Discontinue further antibiotics -Procalcitonin stable -No significant fevers noted, repeat blood cultures -No signs of overt infection noted   Concern for tick bite -Checked Lyme and RMSF titers negative   Mild dyspnea with bilateral pleural effusions-resolved -No overt need for Lasix at this time, continue to monitor -Currently without hypoxemia   [name] with metabolic acidosis-improved -No further IV fluid at this point -Check urine electrolytes; renal ultrasound without any obvious hydronephrosis -Follow a.m. labs    Hypokalemia/hypomagnesemia/hypocalcemia-resolved -Monitor in a.m.   Diarrhea-resolved -Negative C. Difficile -DC further antibiotics 6/15   Parkinsonism -Continue Sinemet 3 times daily   Thrombocytopenia-improving -Currently stable, continue to monitor -Avoid heparin agents -Follow-up with hematology outpatient with prior visit scheduled   DVT prophylaxis: SCDs Code Status: Full Family Communication: Husband, Doug at bedside 6/18 Disposition Plan:  Status is: Inpatient   Remains inpatient appropriate because:Persistent severe electrolyte disturbances, Altered mental status, Ongoing diagnostic testing needed not appropriate for outpatient work up, and IV treatments appropriate due to intensity of illness or inability to take PO   Dispo: The patient is from: Home              Anticipated d/c is to: SNF              Patient currently is not medically stable to d/c.              Difficult to place patient No     Consultants:  None   Procedures:  See below   Antimicrobials:  Anti-infectives (From admission, onward)    Start     Dose/Rate Route Frequency Ordered Stop   08/19/20 2015  ceFEPIme (MAXIPIME) 2 g in sodium chloride 0.9 % 100 mL IVPB  Status:  Discontinued        2 g 200 mL/hr over 30 Minutes Intravenous Every 24 hours 08/19/20 0751 08/20/20 0938   08/15/20 2000  vancomycin (VANCOCIN) IVPB 1000 mg/200 mL premix  Status:  Discontinued       See Hyperspace for full Linked Orders Report.   1,000 mg 200 mL/hr over 60 Minutes Intravenous Every 24 hours 08/14/20 1911 08/18/20 0705   08/14/20 2200  ceFEPIme (MAXIPIME) 2 g in sodium chloride 0.9 % 100 mL IVPB  Status:  Discontinued        2 g 200 mL/hr over 30 Minutes Intravenous Every 12 hours 08/14/20 1905 08/14/20 2008   08/14/20 2100  ceFEPIme (MAXIPIME) 2 g in sodium chloride 0.9 % 100 mL IVPB  Status:  Discontinued        2 g 200 mL/hr over 30 Minutes Intravenous Every 12 hours 08/14/20 2008 08/14/20 2015    08/14/20 2015  ceFEPIme (MAXIPIME) 2 g in sodium chloride 0.9 % 100 mL IVPB  Status:  Discontinued        2 g 200 mL/hr over 30 Minutes Intravenous Every 12 hours 08/14/20 2014 08/19/20 0751   08/14/20 2000  metroNIDAZOLE (FLAGYL) IVPB 500 mg  Status:  Discontinued        500 mg 100 mL/hr over 60 Minutes Intravenous Every 8 hours 08/14/20 1900 08/20/20 0938   08/14/20 2000  vancomycin (VANCOREADY) IVPB 1250 mg/250 mL       See Hyperspace for full Linked Orders Report.   1,250 mg 166.7 mL/hr over 90 Minutes Intravenous  Once 08/14/20 1911 08/14/20 2237       Subjective: Patient seen and evaluated today with improving alertness and oral intake.  Objective: Vitals:   08/21/20 2142 08/22/20 1312 08/22/20 2212 08/23/20 0453  BP: (!) 151/85 (!) 141/85 140/80 (!) 145/90  Pulse: 63 66 89 90  Resp: 20 20 20 18   Temp: 98.1 F (36.7 C) 98.5 F (36.9 C) 98.1 F (36.7 C) 97.8 F (36.6 C)  TempSrc: Oral Oral Oral   SpO2: 96% 97% 95% 94%  Weight:      Height:        Intake/Output Summary (Last 24 hours) at 08/23/2020 1115 Last data filed at 08/23/2020 0600 Gross per 24 hour  Intake 0 ml  Output 1325 ml  Net -1325 ml   Filed Weights   08/14/20 0949 08/14/20 1851  Weight: 55.3 kg 61.4 kg    Examination:  General exam: Appears calm and comfortable  Respiratory system: Clear to auscultation. Respiratory effort normal. Cardiovascular system: S1 & S2 heard, RRR.  Gastrointestinal system: Abdomen is soft Central nervous system: Alert and awake Extremities: No edema Skin: No significant lesions noted Psychiatry: Flat affect.    Data Reviewed: I have personally reviewed following labs and imaging studies  CBC: Recent Labs  Lab 08/17/20 1514 08/18/20 0603 08/19/20 0503 08/20/20 0516 08/21/20 0510 08/22/20 0638 08/23/20 0654  WBC 4.6 7.5 7.7 6.1 5.6 7.2 8.2  NEUTROABS 4.1 6.6  --   --   --   --   --   HGB 13.8 13.8 12.7 10.5* 12.1 13.2 13.0  HCT 38.7 40.8 36.6 30.6*  35.7* 38.7 37.7  MCV 88.0 91.3 89.5 91.3 89.7 88.8 88.7  PLT 49* 46* 53* 44* 63* 72* 85*   Basic Metabolic Panel: Recent Labs  Lab 08/19/20 0503 08/20/20 0516 08/21/20 0510 08/22/20 0638 08/23/20 0654  NA 129* 134* 136 138 140  K 4.4 3.0* 3.7 3.6 3.6  CL 110 116* 116* 115* 116*  CO2 15* 13* 18* 19* 19*  GLUCOSE 84 68* 215* 83 102*  BUN 37* 30* 30* 29* 28*  CREATININE 1.68* 1.32* 1.36* 1.20* 1.07*  CALCIUM 6.3* 5.1* 7.0* 7.3* 7.3*  MG 1.9 1.5* 2.1 2.2 2.1  PHOS  --  4.6  --   --   --    GFR: Estimated Creatinine Clearance: 43.8 mL/min (A) (by C-G formula based on SCr of 1.07 mg/dL (H)). Liver Function Tests: Recent  Labs  Lab 08/22/20 0638 08/23/20 0654  AST 167* 138*  ALT 44 27  ALKPHOS 370* 383*  BILITOT 0.8 0.8  PROT 4.6* 4.8*  ALBUMIN 1.8* 1.9*   No results for input(s): LIPASE, AMYLASE in the last 168 hours. Recent Labs  Lab 08/19/20 1126  AMMONIA 28   Coagulation Profile: No results for input(s): INR, PROTIME in the last 168 hours. Cardiac Enzymes: No results for input(s): CKTOTAL, CKMB, CKMBINDEX, TROPONINI in the last 168 hours. BNP (last 3 results) No results for input(s): PROBNP in the last 8760 hours. HbA1C: No results for input(s): HGBA1C in the last 72 hours. CBG: Recent Labs  Lab 08/20/20 0817  GLUCAP 124*   Lipid Profile: No results for input(s): CHOL, HDL, LDLCALC, TRIG, CHOLHDL, LDLDIRECT in the last 72 hours. Thyroid Function Tests: No results for input(s): TSH, T4TOTAL, FREET4, T3FREE, THYROIDAB in the last 72 hours. Anemia Panel: No results for input(s): VITAMINB12, FOLATE, FERRITIN, TIBC, IRON, RETICCTPCT in the last 72 hours. Sepsis Labs: Recent Labs  Lab 08/18/20 0603 08/19/20 0503 08/20/20 0516 08/21/20 0510  PROCALCITON 4.32 3.12 1.68 1.80    Recent Results (from the past 240 hour(s))  Resp Panel by RT-PCR (Flu A&B, Covid) Nasopharyngeal Swab     Status: None   Collection Time: 08/14/20  2:22 PM   Specimen:  Nasopharyngeal Swab; Nasopharyngeal(NP) swabs in vial transport medium  Result Value Ref Range Status   SARS Coronavirus 2 by RT PCR NEGATIVE NEGATIVE Final    Comment: (NOTE) SARS-CoV-2 target nucleic acids are NOT DETECTED.  The SARS-CoV-2 RNA is generally detectable in upper respiratory specimens during the acute phase of infection. The lowest concentration of SARS-CoV-2 viral copies this assay can detect is 138 copies/mL. A negative result does not preclude SARS-Cov-2 infection and should not be used as the sole basis for treatment or other patient management decisions. A negative result may occur with  improper specimen collection/handling, submission of specimen other than nasopharyngeal swab, presence of viral mutation(s) within the areas targeted by this assay, and inadequate number of viral copies(<138 copies/mL). A negative result must be combined with clinical observations, patient history, and epidemiological information. The expected result is Negative.  Fact Sheet for Patients:  EntrepreneurPulse.com.au  Fact Sheet for Healthcare Providers:  IncredibleEmployment.be  This test is no t yet approved or cleared by the [place] FDA and  has been authorized for detection and/or diagnosis of SARS-CoV-2 by FDA under an Emergency Use Authorization (EUA). This EUA will remain  in effect (meaning this test can be used) for the duration of the COVID-19 declaration under Section 564(b)(1) of the Act, 21 U.S.C.section 360bbb-3(b)(1), unless the authorization is terminated  or revoked sooner.       Influenza A by PCR NEGATIVE NEGATIVE Final   Influenza B by PCR NEGATIVE NEGATIVE Final    Comment: (NOTE) The Xpert Xpress SARS-CoV-2/FLU/RSV plus assay is intended as an aid in the diagnosis of influenza from Nasopharyngeal swab specimens and should not be used as a sole basis for treatment. Nasal washings and aspirates are unacceptable for  Xpert Xpress SARS-CoV-2/FLU/RSV testing.  Fact Sheet for Patients: EntrepreneurPulse.com.au  Fact Sheet for Healthcare Providers: IncredibleEmployment.be  This test is not yet approved or cleared by the [place] FDA and has been authorized for detection and/or diagnosis of SARS-CoV-2 by FDA under an Emergency Use Authorization (EUA). This EUA will remain in effect (meaning this test can be used) for the duration of the COVID-19 declaration under Section 564(b)(1) of  the Act, 21 U.S.C. section 360bbb-3(b)(1), unless the authorization is terminated or revoked.  Performed at [facility], [address], [place], [place] [address]   Gastrointestinal Panel by PCR , Stool     Status: None   Collection Time: 08/14/20  4:04 PM   Specimen: Stool  Result Value Ref Range Status   Campylobacter species NOT DETECTED NOT DETECTED Final   Plesimonas shigelloides NOT DETECTED NOT DETECTED Final   Salmonella species NOT DETECTED NOT DETECTED Final   Yersinia enterocolitica NOT DETECTED NOT DETECTED Final   Vibrio species NOT DETECTED NOT DETECTED Final   Vibrio cholerae NOT DETECTED NOT DETECTED Final   Enteroaggregative E coli (EAEC) NOT DETECTED NOT DETECTED Final   Enteropathogenic E coli (EPEC) NOT DETECTED NOT DETECTED Final   Enterotoxigenic E coli (ETEC) NOT DETECTED NOT DETECTED Final   Shiga like toxin producing E coli (STEC) NOT DETECTED NOT DETECTED Final   Shigella/Enteroinvasive E coli (EIEC) NOT DETECTED NOT DETECTED Final   Cryptosporidium NOT DETECTED NOT DETECTED Final   Cyclospora cayetanensis NOT DETECTED NOT DETECTED Final   Entamoeba histolytica NOT DETECTED NOT DETECTED Final   Giardia lamblia NOT DETECTED NOT DETECTED Final   Adenovirus F40/41 NOT DETECTED NOT DETECTED Final   Astrovirus NOT DETECTED NOT DETECTED Final   Norovirus GI/GII NOT DETECTED NOT DETECTED Final   Rotavirus A NOT DETECTED NOT DETECTED Final   Sapovirus (I,  II, IV, and V) NOT DETECTED NOT DETECTED Final    Comment: Performed at [facility], [address] [place], [place] [address]  Culture, blood (x 2)     Status: None   Collection Time: 08/14/20  8:29 PM   Specimen: BLOOD RIGHT HAND  Result Value Ref Range Status   Specimen Description BLOOD RIGHT HAND  Final   Special Requests   Final    BOTTLES DRAWN AEROBIC AND ANAEROBIC Blood Culture adequate volume   Culture   Final    NO GROWTH 5 DAYS Performed at [facility], [address] [place], [place] [address]    Report Status 08/19/2020 FINAL  Final  Culture, blood (x 2)     Status: None   Collection Time: 08/14/20  8:39 PM   Specimen: BLOOD LEFT HAND  Result Value Ref Range Status   Specimen Description BLOOD LEFT HAND  Final   Special Requests   Final    BOTTLES DRAWN AEROBIC AND ANAEROBIC Blood Culture adequate volume   Culture   Final    NO GROWTH 5 DAYS Performed at [facility], [address], [place], [place] [address]    Report Status 08/19/2020 FINAL  Final  C Difficile Quick Screen w PCR reflex     Status: None   Collection Time: 08/16/20 10:00 PM   Specimen: STOOL  Result Value Ref Range Status   C Diff antigen NEGATIVE NEGATIVE Final   C Diff toxin NEGATIVE NEGATIVE Final   C Diff interpretation No C. difficile detected.  Final    Comment: Performed at [facility], [address] [place], [place] [address]  Culture, blood (routine x 2)     Status: None (Preliminary result)   Collection Time: 08/19/20 11:26 AM   Specimen: BLOOD  Result Value Ref Range Status   Specimen Description BLOOD BLOOD LEFT HAND  Final   Special Requests   Final    Blood Culture adequate volume BOTTLES DRAWN AEROBIC AND ANAEROBIC   Culture   Final    NO GROWTH 4 DAYS Performed  at [facility], [address], [place], [place] [address]    Report Status PENDING  Incomplete  Culture, blood (routine x 2)     Status: None (Preliminary result)   Collection Time: 08/19/20 11:26 AM    Specimen: BLOOD  Result Value Ref Range Status   Specimen Description BLOOD LEFT ANTECUBITAL  Final   Special Requests   Final    Blood Culture adequate volume BOTTLES DRAWN AEROBIC AND ANAEROBIC   Culture   Final    NO GROWTH 4 DAYS Performed at [facility], [address], [place], [place] [address]    Report Status PENDING  Incomplete  MRSA Next Gen by PCR, Nasal     Status: None   Collection Time: 08/19/20 11:49 AM  Result Value Ref Range Status   MRSA by PCR Next Gen NOT DETECTED NOT DETECTED Final    Comment: (NOTE) The GeneXpert MRSA Assay (FDA approved for NASAL specimens only), is one component of a comprehensive MRSA colonization surveillance program. It is not intended to diagnose MRSA infection nor to guide or monitor treatment for MRSA infections. Test performance is not FDA approved in patients less than 30 years old. Performed at [facility], [address] [place], [place] [address]          Radiology Studies: MR ANGIO HEAD WO CONTRAST  Result Date: 08/22/2020 CLINICAL DATA:  Mental status change. Unknown cause. Non communicative EXAM: MRI HEAD WITHOUT CONTRAST MRA HEAD WITHOUT CONTRAST TECHNIQUE: Multiplanar, multi-echo pulse sequences of the brain and surrounding structures were acquired without intravenous contrast. Angiographic images of the Circle of Willis were acquired using MRA technique without intravenous contrast. COMPARISON: No pertinent prior exam. COMPARISON:  No pertinent prior exam. FINDINGS: MRI HEAD FINDINGS Brain: No acute infarct, hemorrhage, or mass lesion is present. Mild white matter changes are within normal limits for age. The ventricles are of normal size. No significant extraaxial fluid collection is present. The internal auditory canals are within normal limits. The brainstem and cerebellum are within normal limits. Vascular: Flow is present in the major intracranial arteries. Skull and upper cervical spine: The craniocervical junction is  normal. Upper cervical spine is within normal limits. Marrow signal is unremarkable. Sinuses/Orbits: Bilateral mastoid effusions are present. No obstructing nasopharyngeal lesion is evident. Diffuse mucosal thickening is present throughout the remainder the paranasal sinuses. No fluid levels are present. Bilateral lens replacements are noted. Globes and orbits are otherwise unremarkable. MRA HEAD FINDINGS Anterior circulation: The internal carotid arteries are within normal limits from the high cervical segments through the ICA termini. The A1 and M1 segments are normal. No definite anterior communicating artery is present. There is some patient motion. ACA and MCA branch vessels are within normal limits. Posterior circulation: The left vertebral artery is the dominant vessel. PICA origins are visualized and normal. The vertebrobasilar junction is normal. Prominent posterior communicating arteries are present bilaterally. Small P1 segments are present. PCA branch vessels are not well seen due to patient motion. Anatomic variants: Prominent posterior communicating arteries bilaterally. IMPRESSION: 1. Normal MRI appearance of the brain for age.  No acute infarct. 2. Bilateral mastoid effusions. No obstructing nasopharyngeal lesion is present. 3. Normal MRA circle-of-Willis without significant proximal stenosis, aneurysm, or branch vessel occlusion. Electronically Signed   By: San Morelle M.D.   On: 08/22/2020 13:24   MR BRAIN WO CONTRAST  Result Date: 08/22/2020 CLINICAL DATA:  Mental status change. Unknown cause. Non communicative EXAM: MRI HEAD WITHOUT CONTRAST MRA HEAD WITHOUT CONTRAST TECHNIQUE:  Multiplanar, multi-echo pulse sequences of the brain and surrounding structures were acquired without intravenous contrast. Angiographic images of the Circle of Willis were acquired using MRA technique without intravenous contrast. COMPARISON: No pertinent prior exam. COMPARISON:  No pertinent prior exam.  FINDINGS: MRI HEAD FINDINGS Brain: No acute infarct, hemorrhage, or mass lesion is present. Mild white matter changes are within normal limits for age. The ventricles are of normal size. No significant extraaxial fluid collection is present. The internal auditory canals are within normal limits. The brainstem and cerebellum are within normal limits. Vascular: Flow is present in the major intracranial arteries. Skull and upper cervical spine: The craniocervical junction is normal. Upper cervical spine is within normal limits. Marrow signal is unremarkable. Sinuses/Orbits: Bilateral mastoid effusions are present. No obstructing nasopharyngeal lesion is evident. Diffuse mucosal thickening is present throughout the remainder the paranasal sinuses. No fluid levels are present. Bilateral lens replacements are noted. Globes and orbits are otherwise unremarkable. MRA HEAD FINDINGS Anterior circulation: The internal carotid arteries are within normal limits from the high cervical segments through the ICA termini. The A1 and M1 segments are normal. No definite anterior communicating artery is present. There is some patient motion. ACA and MCA branch vessels are within normal limits. Posterior circulation: The left vertebral artery is the dominant vessel. PICA origins are visualized and normal. The vertebrobasilar junction is normal. Prominent posterior communicating arteries are present bilaterally. Small P1 segments are present. PCA branch vessels are not well seen due to patient motion. Anatomic variants: Prominent posterior communicating arteries bilaterally. IMPRESSION: 1. Normal MRI appearance of the brain for age.  No acute infarct. 2. Bilateral mastoid effusions. No obstructing nasopharyngeal lesion is present. 3. Normal MRA circle-of-Willis without significant proximal stenosis, aneurysm, or branch vessel occlusion. Electronically Signed   By: San Morelle M.D.   On: 08/22/2020 13:24   DG Chest Port 1  View  Result Date: 08/21/2020 CLINICAL DATA:  Worsening shortness of breath, history of lung cancer post RIGHT lobectomy, Parkinson's, former smoker, hiatal hernia, asthma EXAM: PORTABLE CHEST 1 VIEW COMPARISON:  Portable exam 1300 hours compared to 08/18/2020 FINDINGS: Normal heart size, mediastinal contours, and pulmonary vascularity. Atelectasis versus consolidation LEFT lower lobe, increased. Mild RIGHT basilar atelectasis and probable small RIGHT pleural effusion. Upper lungs clear. No pneumothorax or acute osseous findings. IMPRESSION: Increased atelectasis versus consolidation LEFT lower lobe. RIGHT basilar atelectasis and probable small RIGHT pleural effusion. Electronically Signed   By: Lavonia Dana M.D.   On: 08/21/2020 14:39   EEG adult  Result Date: 08/22/2020 Lora Havens, MD     08/22/2020  7:25 PM Patient Name: Lama Narayanan. Barbato MRN: [id number] Epilepsy Attending: Lora Havens Referring Physician/Provider: Dr Heath Lark Date: 08/22/2020 Duration: 22.23 mins Patient history: 74yo F with ams. EEG to evaluate for seizure Level of alertness: Awake AEDs during EEG study: None Technical aspects: This EEG study was done with scalp electrodes positioned according to the 10-20 International system of electrode placement. Electrical activity was acquired at a sampling rate of 500Hz  and reviewed with a high frequency filter of 70Hz  and a low frequency filter of 1Hz . EEG data were recorded continuously and digitally stored. Description: No posterior dominant rhythm was seen. EEG showed continuous generalized 5 to 6 Hz theta as well as intermittent 2-3Hz  delta slowing, at time with triphasic morphology.  Hyperventilation and photic stimulation were not performed.   ABNORMALITY - Continuous slow, generalized IMPRESSION: This study is suggestive of moderate diffuse encephalopathy, nonspecific etiology but likely  related to toxic-metabolic causes. No seizures or definite epileptiform discharges were seen  throughout the recording. Priyanka Barbra Sarks        Scheduled Meds:  atenolol  25 mg Oral Daily   carbidopa-levodopa  2 tablet Oral TID   Chlorhexidine Gluconate Cloth  6 each Topical Daily   feeding supplement  237 mL Oral TID BM   pantoprazole  40 mg Oral Daily   saccharomyces boulardii  250 mg Oral BID   sodium bicarbonate  650 mg Oral BID     LOS: 8 days    Time spent: 35 minutes    Goro Wenrick Darleen Crocker, DO [facility]  If 7PM-7AM, please contact night-coverage [email or web address] 08/23/2020, 11:15 AM

## 2020-08-24 LAB — COMPREHENSIVE METABOLIC PANEL
ALT: 13 U/L (ref 0–44)
AST: 105 U/L — ABNORMAL HIGH (ref 15–41)
Albumin: 1.9 g/dL — ABNORMAL LOW (ref 3.5–5.0)
Alkaline Phosphatase: 358 U/L — ABNORMAL HIGH (ref 38–126)
Anion gap: 4 — ABNORMAL LOW (ref 5–15)
BUN: 29 mg/dL — ABNORMAL HIGH (ref 8–23)
CO2: 23 mmol/L (ref 22–32)
Calcium: 7.4 mg/dL — ABNORMAL LOW (ref 8.9–10.3)
Chloride: 113 mmol/L — ABNORMAL HIGH (ref 98–111)
Creatinine, Ser: 0.94 mg/dL (ref 0.44–1.00)
GFR, Estimated: >60 mL/min (ref >60)
Glucose, Bld: 102 mg/dL — ABNORMAL HIGH (ref 70–99)
Potassium: 3.7 mmol/L (ref 3.5–5.1)
Sodium: 140 mmol/L (ref 135–145)
Total Bilirubin: 0.8 mg/dL (ref 0.3–1.2)
Total Protein: 4.8 g/dL — ABNORMAL LOW (ref 6.5–8.1)

## 2020-08-24 LAB — CULTURE, BLOOD (ROUTINE X 2)
Culture: NO GROWTH
Culture: NO GROWTH
Special Requests: ADEQUATE
Special Requests: ADEQUATE

## 2020-08-24 LAB — MAGNESIUM: Magnesium: 2 mg/dL (ref 1.7–2.4)

## 2020-08-24 NOTE — Progress Notes (Signed)
PROGRESS NOTE    Deborah [name]  EXH:[id number] DOB: [birth year] DOA: 08/14/2020 PCP: Loman Brooklyn, FNP   Brief Narrative:   Deborah [name]  is a 75 y.o. female, with past medical history of Parkinson, depression, anxiety, GERD and hypertension, patient presents to ED secondary to multiple complaints, including nausea, vomiting, weakness and diarrhea, reports symptoms started 2 weeks ago after they ate in a steak place.  She was admitted with SIRS criteria as well as [name] and electrolyte abnormalities to include hypocalcemia.  Continues to have ongoing weakness and confusion with poor appetite-that is now improving.  Plan for discharge to SNF once eating well.  Assessment & Plan:   Active Problems:   GERD (gastroesophageal reflux disease)   Generalized anxiety disorder   Parkinson disease (HCC)   Hyponatremia   SIRS (systemic inflammatory response syndrome) (HCC)   Acute metabolic encephalopathy-multifactorial-improving -Likely related to potential infection, [name], and significant electrolyte abnormalities in the setting of parkinsonism -Plan to trial Romazicon 6/16 due to suspicion of benzodiazepine overdose with improvement noted -Continues with weakness and poor oral intake -PT evaluation recommending SNF -Brain MRI EEG without any acute findings on 6/17 -Continue to see if dietary advancement will be tolerated prior to discharge to SNF   SIRS-resolved -Discontinue further antibiotics -Procalcitonin stable -No significant fevers noted, repeat blood cultures -No signs of overt infection noted   Concern for tick bite -Checked Lyme and RMSF titers negative   Mild dyspnea with bilateral pleural effusions-resolved -No overt need for Lasix at this time, continue to monitor -Currently without hypoxemia   [name] with metabolic acidosis-improved -No further IV fluid at this point -Check urine electrolytes; renal ultrasound without any obvious hydronephrosis -Follow a.m. labs    Hypokalemia/hypomagnesemia/hypocalcemia-resolved   Diarrhea-resolved -Negative C. Difficile -DC further antibiotics 6/15   Parkinsonism -Continue Sinemet 3 times daily   Thrombocytopenia-improving -Currently stable, continue to monitor -Avoid heparin agents -Follow-up with hematology outpatient with prior visit scheduled   DVT prophylaxis: SCDs Code Status: Full Family Communication: Husband, Deborah [name] at bedside 6/19 Disposition Plan:  Status is: Inpatient   Remains inpatient appropriate because:Persistent severe electrolyte disturbances, Altered mental status, Ongoing diagnostic testing needed not appropriate for outpatient work up, and IV treatments appropriate due to intensity of illness or inability to take PO   Dispo: The patient is from: Home              Anticipated d/c is to: SNF              Patient currently is not medically stable to d/c.              Difficult to place patient No     Consultants:  None   Procedures:  See below  Antimicrobials:  Anti-infectives (From admission, onward)    Start     Dose/Rate Route Frequency Ordered Stop   08/19/20 2015  ceFEPIme (MAXIPIME) 2 g in sodium chloride 0.9 % 100 mL IVPB  Status:  Discontinued        2 g 200 mL/hr over 30 Minutes Intravenous Every 24 hours 08/19/20 0751 08/20/20 0938   08/15/20 2000  vancomycin (VANCOCIN) IVPB 1000 mg/200 mL premix  Status:  Discontinued       See Hyperspace for full Linked Orders Report.   1,000 mg 200 mL/hr over 60 Minutes Intravenous Every 24 hours 08/14/20 1911 08/18/20 0705   08/14/20 2200  ceFEPIme (MAXIPIME) 2 g in sodium chloride 0.9 % 100 mL IVPB  Status:  Discontinued  2 g 200 mL/hr over 30 Minutes Intravenous Every 12 hours 08/14/20 1905 08/14/20 2008   08/14/20 2100  ceFEPIme (MAXIPIME) 2 g in sodium chloride 0.9 % 100 mL IVPB  Status:  Discontinued        2 g 200 mL/hr over 30 Minutes Intravenous Every 12 hours 08/14/20 2008 08/14/20 2015   08/14/20 2015   ceFEPIme (MAXIPIME) 2 g in sodium chloride 0.9 % 100 mL IVPB  Status:  Discontinued        2 g 200 mL/hr over 30 Minutes Intravenous Every 12 hours 08/14/20 2014 08/19/20 0751   08/14/20 2000  metroNIDAZOLE (FLAGYL) IVPB 500 mg  Status:  Discontinued        500 mg 100 mL/hr over 60 Minutes Intravenous Every 8 hours 08/14/20 1900 08/20/20 0938   08/14/20 2000  vancomycin (VANCOREADY) IVPB 1250 mg/250 mL       See Hyperspace for full Linked Orders Report.   1,250 mg 166.7 mL/hr over 90 Minutes Intravenous  Once 08/14/20 1911 08/14/20 2237      Subjective: Patient seen and evaluated today with no new acute complaints or concerns. No acute concerns or events noted overnight.  Objective: Vitals:   08/23/20 2218 08/24/20 0552 08/24/20 1018 08/24/20 1442  BP: 110/76 (!) 142/87 133/78 (!) 141/78  Pulse: 80 72 70 63  Resp: 16 18 15 18   Temp: 97.8 F (36.6 C) 97.8 F (36.6 C) 97.7 F (36.5 C) 97.8 F (36.6 C)  TempSrc: Oral Oral Oral Oral  SpO2: 96% 97% 99% 98%  Weight:  63.3 kg    Height:        Intake/Output Summary (Last 24 hours) at 08/24/2020 1447 Last data filed at 08/24/2020 0500 Gross per 24 hour  Intake 590 ml  Output 650 ml  Net -60 ml   Filed Weights   08/14/20 0949 08/14/20 1851 08/24/20 0552  Weight: 55.3 kg 61.4 kg 63.3 kg    Examination:  General exam: Appears calm and comfortable  Respiratory system: Clear to auscultation. Respiratory effort normal. Cardiovascular system: S1 & S2 heard, RRR.  Gastrointestinal system: Abdomen is soft Central nervous system: Alert and awake Extremities: No edema Skin: No significant lesions noted Psychiatry: Flat affect.    Data Reviewed: I have personally reviewed following labs and imaging studies  CBC: Recent Labs  Lab 08/17/20 1514 08/18/20 0603 08/19/20 0503 08/20/20 0516 08/21/20 0510 08/22/20 0638 08/23/20 0654  WBC 4.6 7.5 7.7 6.1 5.6 7.2 8.2  NEUTROABS 4.1 6.6  --   --   --   --   --   HGB 13.8 13.8  12.7 10.5* 12.1 13.2 13.0  HCT 38.7 40.8 36.6 30.6* 35.7* 38.7 37.7  MCV 88.0 91.3 89.5 91.3 89.7 88.8 88.7  PLT 49* 46* 53* 44* 63* 72* 85*   Basic Metabolic Panel: Recent Labs  Lab 08/20/20 0516 08/21/20 0510 08/22/20 0638 08/23/20 0654 08/24/20 0647  NA 134* 136 138 140 140  K 3.0* 3.7 3.6 3.6 3.7  CL 116* 116* 115* 116* 113*  CO2 13* 18* 19* 19* 23  GLUCOSE 68* 215* 83 102* 102*  BUN 30* 30* 29* 28* 29*  CREATININE 1.32* 1.36* 1.20* 1.07* 0.94  CALCIUM 5.1* 7.0* 7.3* 7.3* 7.4*  MG 1.5* 2.1 2.2 2.1 2.0  PHOS 4.6  --   --   --   --    GFR: Estimated Creatinine Clearance: 49.9 mL/min (by C-G formula based on SCr of 0.94 mg/dL). Liver Function Tests: Recent Labs  Lab 08/22/20 0638 08/23/20 0654 08/24/20 0647  AST 167* 138* 105*  ALT 44 27 13  ALKPHOS 370* 383* 358*  BILITOT 0.8 0.8 0.8  PROT 4.6* 4.8* 4.8*  ALBUMIN 1.8* 1.9* 1.9*   No results for input(s): LIPASE, AMYLASE in the last 168 hours. Recent Labs  Lab 08/19/20 1126  AMMONIA 28   Coagulation Profile: No results for input(s): INR, PROTIME in the last 168 hours. Cardiac Enzymes: No results for input(s): CKTOTAL, CKMB, CKMBINDEX, TROPONINI in the last 168 hours. BNP (last 3 results) No results for input(s): PROBNP in the last 8760 hours. HbA1C: No results for input(s): HGBA1C in the last 72 hours. CBG: Recent Labs  Lab 08/20/20 0817  GLUCAP 124*   Lipid Profile: No results for input(s): CHOL, HDL, LDLCALC, TRIG, CHOLHDL, LDLDIRECT in the last 72 hours. Thyroid Function Tests: No results for input(s): TSH, T4TOTAL, FREET4, T3FREE, THYROIDAB in the last 72 hours. Anemia Panel: No results for input(s): VITAMINB12, FOLATE, FERRITIN, TIBC, IRON, RETICCTPCT in the last 72 hours. Sepsis Labs: Recent Labs  Lab 08/18/20 0603 08/19/20 0503 08/20/20 0516 08/21/20 0510  PROCALCITON 4.32 3.12 1.68 1.80    Recent Results (from the past 240 hour(s))  Gastrointestinal Panel by PCR , Stool     Status:  None   Collection Time: 08/14/20  4:04 PM   Specimen: Stool  Result Value Ref Range Status   Campylobacter species NOT DETECTED NOT DETECTED Final   Plesimonas shigelloides NOT DETECTED NOT DETECTED Final   Salmonella species NOT DETECTED NOT DETECTED Final   Yersinia enterocolitica NOT DETECTED NOT DETECTED Final   Vibrio species NOT DETECTED NOT DETECTED Final   Vibrio cholerae NOT DETECTED NOT DETECTED Final   Enteroaggregative E coli (EAEC) NOT DETECTED NOT DETECTED Final   Enteropathogenic E coli (EPEC) NOT DETECTED NOT DETECTED Final   Enterotoxigenic E coli (ETEC) NOT DETECTED NOT DETECTED Final   Shiga like toxin producing E coli (STEC) NOT DETECTED NOT DETECTED Final   Shigella/Enteroinvasive E coli (EIEC) NOT DETECTED NOT DETECTED Final   Cryptosporidium NOT DETECTED NOT DETECTED Final   Cyclospora cayetanensis NOT DETECTED NOT DETECTED Final   Entamoeba histolytica NOT DETECTED NOT DETECTED Final   Giardia lamblia NOT DETECTED NOT DETECTED Final   Adenovirus F40/41 NOT DETECTED NOT DETECTED Final   Astrovirus NOT DETECTED NOT DETECTED Final   Norovirus GI/GII NOT DETECTED NOT DETECTED Final   Rotavirus A NOT DETECTED NOT DETECTED Final   Sapovirus (I, II, IV, and V) NOT DETECTED NOT DETECTED Final    Comment: Performed at [facility], [address] [place], [place] [address]  Culture, blood (x 2)     Status: None   Collection Time: 08/14/20  8:29 PM   Specimen: BLOOD RIGHT HAND  Result Value Ref Range Status   Specimen Description BLOOD RIGHT HAND  Final   Special Requests   Final    BOTTLES DRAWN AEROBIC AND ANAEROBIC Blood Culture adequate volume   Culture   Final    NO GROWTH 5 DAYS Performed at [facility], [address], [place], [place] [address]    Report Status 08/19/2020 FINAL  Final  Culture, blood (x 2)     Status: None   Collection Time: 08/14/20  8:39 PM   Specimen: BLOOD LEFT HAND  Result Value Ref Range Status   Specimen Description  BLOOD LEFT HAND  Final   Special Requests   Final    BOTTLES DRAWN AEROBIC AND ANAEROBIC Blood Culture  adequate volume   Culture   Final    NO GROWTH 5 DAYS Performed at [facility], [address], [place], [place] [address]    Report Status 08/19/2020 FINAL  Final  C Difficile Quick Screen w PCR reflex     Status: None   Collection Time: 08/16/20 10:00 PM   Specimen: STOOL  Result Value Ref Range Status   C Diff antigen NEGATIVE NEGATIVE Final   C Diff toxin NEGATIVE NEGATIVE Final   C Diff interpretation No C. difficile detected.  Final    Comment: Performed at [facility], [address], [place], [place] [address]  Culture, blood (routine x 2)     Status: None   Collection Time: 08/19/20 11:26 AM   Specimen: BLOOD  Result Value Ref Range Status   Specimen Description BLOOD BLOOD LEFT HAND  Final   Special Requests   Final    Blood Culture adequate volume BOTTLES DRAWN AEROBIC AND ANAEROBIC   Culture   Final    NO GROWTH 5 DAYS Performed at [facility], [address] [place], [place] [address]    Report Status 08/24/2020 FINAL  Final  Culture, blood (routine x 2)     Status: None   Collection Time: 08/19/20 11:26 AM   Specimen: BLOOD  Result Value Ref Range Status   Specimen Description BLOOD LEFT ANTECUBITAL  Final   Special Requests   Final    Blood Culture adequate volume BOTTLES DRAWN AEROBIC AND ANAEROBIC   Culture   Final    NO GROWTH 5 DAYS Performed at [facility], [address] [place], [place] [address]    Report Status 08/24/2020 FINAL  Final  MRSA Next Gen by PCR, Nasal     Status: None   Collection Time: 08/19/20 11:49 AM  Result Value Ref Range Status   MRSA by PCR Next Gen NOT DETECTED NOT DETECTED Final    Comment: (NOTE) The GeneXpert MRSA Assay (FDA approved for NASAL specimens only), is one component of a comprehensive MRSA colonization surveillance program. It is not intended to diagnose MRSA infection nor to guide or monitor treatment for  MRSA infections. Test performance is not FDA approved in patients less than 54 years old. Performed at [facility], [address] [place], [place] [address]          Radiology Studies: EEG adult  Result Date: 2020/09/18 Deborah Havens, MD     09/18/2020  7:25 PM Patient Name: Lakie Mclouth. Arzuaga MRN: [id number] Epilepsy Attending: Lora [name] Referring Physician/Provider: Dr Heath Lark Date: 18-Sep-2020 Duration: 22.23 mins Patient history: 74yo F with ams. EEG to evaluate for seizure Level of alertness: Awake AEDs during EEG study: None Technical aspects: This EEG study was done with scalp electrodes positioned according to the 10-20 International system of electrode placement. Electrical activity was acquired at a sampling rate of 500Hz  and reviewed with a high frequency filter of 70Hz  and a low frequency filter of 1Hz . EEG data were recorded continuously and digitally stored. Description: No posterior dominant rhythm was seen. EEG showed continuous generalized 5 to 6 Hz theta as well as intermittent 2-3Hz  delta slowing, at time with triphasic morphology.  Hyperventilation and photic stimulation were not performed.   ABNORMALITY - Continuous slow, generalized IMPRESSION: This study is suggestive of moderate diffuse encephalopathy, nonspecific etiology but likely related to toxic-metabolic causes. No seizures or definite epileptiform discharges were seen throughout the recording. Deborah [name]  Scheduled Meds:  atenolol  25 mg Oral Daily   carbidopa-levodopa  2 tablet Oral TID   Chlorhexidine Gluconate Cloth  6 each Topical Daily   feeding supplement  237 mL Oral TID BM   pantoprazole  40 mg Oral Daily   saccharomyces boulardii  250 mg Oral BID   sodium bicarbonate  650 mg Oral BID     LOS: 9 days    Time spent: 35 minutes    Deborah Kil Darleen Crocker, DO [facility]  If 7PM-7AM, please contact night-coverage [email or web address] 08/24/2020, 2:47 PM

## 2020-08-24 NOTE — TOC Progression Note (Signed)
Transition of Care (TOC) - Progression Note    Patient Details  Name: Deborah [name]. [place] MRN: [id number] Date of Birth: Jan 06, 1946  Transition of Care [facility]) CM/SW Contact  Natasha Bence, LCSW Phone Number: 08/24/2020, 1:55 PM  Clinical Narrative:    Patient not able to be transported with [facility] or cab. [facility] reported that [facility] calls will be priority and they are unlikely to be able to transport patient. [facility] also reported that they are short staffed and unable to accept patient until Monday 08/25/2020. TOC to follow.    Expected Discharge Plan: [place] Barriers to Discharge: Continued Medical Work up  Expected Discharge Plan and Services Expected Discharge Plan: [place] In-house Referral: Clinical Social Work Discharge Planning Services: CM Consult Post Acute Care Choice: [place] Living arrangements for the past 2 months: Single Family Home                 DME Arranged: N/A DME Agency: NA       HH Arranged: NA HH Agency: NA         Social Determinants of Health (SDOH) Interventions    Readmission Risk Interventions Readmission Risk Prevention Plan 08/18/2020  Medication Screening Complete  Transportation Screening Complete

## 2020-08-25 ENCOUNTER — Ambulatory Visit (HOSPITAL_COMMUNITY): Payer: Medicare Other | Admitting: Hematology

## 2020-08-25 LAB — RESP PANEL BY RT-PCR (FLU A&B, COVID) ARPGX2
Influenza A by PCR: NEGATIVE
Influenza B by PCR: NEGATIVE
SARS Coronavirus 2 by RT PCR: NEGATIVE

## 2020-08-25 LAB — CREATININE, SERUM
Creatinine, Ser: 0.81 mg/dL (ref 0.44–1.00)
GFR, Estimated: >60 mL/min (ref >60)

## 2020-08-25 MED ORDER — ENSURE ENLIVE PO LIQD: 237.0000 mL | Freq: Three times a day (TID) | ORAL | 12 refills | Status: DC

## 2020-08-25 NOTE — TOC Transition Note (Signed)
Transition of Care [facility]) - CM/SW Discharge Note   Patient Details  Name: Deborah [name] MRN: [id number] Date of Birth: 12-04-1945  [facility]) CM/SW Contact:  Boneta Lucks, RN Phone Number: 08/25/2020, 11:39 AM   Clinical Narrative:   Patient medically ready to discharge to [facility]. Melissa provided number for report and room number. MD and Melissa approved for patient to be transported by husband. RN to call report after COVID test has resulted.  TOC spoke with husband and patient, he is agreeable to transport his wife to [facility]. RN updated and will assist patient in car.   Final next level of care: Skilled Nursing Facility Barriers to Discharge: Barriers Resolved   Patient Goals and CMS Choice Patient states their goals for this hospitalization and ongoing recovery are:: Go to SNF CMS Medicare.gov Compare Post Acute Care list provided to:: Patient Choice offered to / list presented to : Patient, Spouse  Discharge Placement              Patient chooses bed at: [facility] Patient to be transferred to facility by: [name] Name of family member notified: Husband - Douglas Patient and family notified of of transfer: 08/25/20  Discharge Plan and Services In-house Referral: Clinical Social Work Discharge Planning Services: CM Consult Post Acute Care Choice: [place]          DME Arranged: N/A DME Agency: NA      HH Arranged: NA [place] Agency: NA     Readmission Risk Interventions Readmission Risk Prevention Plan 08/25/2020 08/18/2020  Post Dischage Appt Complete -  Medication Screening Complete Complete  Transportation Screening Complete Complete

## 2020-08-25 NOTE — Progress Notes (Signed)
Attempted to call Nurse x3 for report at [facility] and no answer. Will try again later on.

## 2020-08-25 NOTE — Discharge Summary (Signed)
Physician Discharge Summary  Deborah [name]. Cuneo PJK:[id number] DOB: Dec 13, 1945 DOA: 08/14/2020  PCP: Loman Brooklyn, FNP  Admit date: 08/14/2020  Discharge date: 08/25/2020  Admitted From:Home  Disposition:  SNF  Recommendations for Outpatient Follow-up:  Follow up with PCP in 1-2 weeks Continue on medications as noted below and discontinue Klonopin Follow-up with hematology/oncology in the near future  Home Health: None  Equipment/Devices: None  Discharge Condition:Stable  CODE STATUS: Full  Diet recommendation: Heart Healthy  Brief/Interim Summary: Deborah [name]  is a 75 y.o. female, with past medical history of Parkinson, depression, anxiety, GERD and hypertension, patient presents to ED secondary to multiple complaints, including nausea, vomiting, weakness and diarrhea, reports symptoms started 2 weeks ago after they ate in a steak place.  She was admitted with SIRS criteria as well as [name] and electrolyte abnormalities to include hypocalcemia, hypokalemia, and hypomagnesemia.  She continued to have significant weakness and confusion with poor appetite likely related to Klonopin use which she was trying to wean at home.  She received some Romazicon with improvement in her symptoms.  She had laboratory studies as well as brain MRI and EEG with no acute abnormalities.  Over several days, her level of consciousness returned to baseline and she was seen by PT with recommendations for SNF on discharge.  She is now stable for discharge with recommendations to discontinue any further use of Klonopin.  Discharge Diagnoses:  Active Problems:   GERD (gastroesophageal reflux disease)   Generalized anxiety disorder   Parkinson disease ([place])   Hyponatremia   SIRS (systemic inflammatory response syndrome) (HCC)  Principal discharge diagnosis: Acute metabolic encephalopathy secondary to Klonopin use as well as electrolyte derangements/[name].  Discharge Instructions  Discharge Instructions      Diet - low sodium heart healthy   Complete by: As directed    Increase activity slowly   Complete by: As directed       Allergies as of 08/25/2020       Reactions   Amoxicillin-pot Clavulanate Nausea And Vomiting   Cefdinir Nausea And Vomiting   Ciprofloxacin Hives, Itching   Other    Bioxin    Sulfa Antibiotics Hives, Itching        Medication List     STOP taking these medications    clonazePAM 0.5 MG tablet Commonly known as: KLONOPIN       TAKE these medications    atenolol 25 MG tablet Commonly known as: TENORMIN Take 25 mg by mouth daily.   carbidopa-levodopa 25-100 MG tablet Commonly known as: SINEMET IR Take 2 tablets by mouth in the morning, at noon, and at bedtime.   escitalopram 20 MG tablet Commonly known as: LEXAPRO Take 1 tablet by mouth daily at 2 PM.   feeding supplement Liqd Take 237 mLs by mouth 3 (three) times daily between meals.   fluticasone 50 MCG/ACT nasal spray Commonly known as: FLONASE Place 1 spray into both nostrils daily.   omeprazole 20 MG capsule Commonly known as: PRILOSEC Take 1 capsule by mouth daily at 2 PM.   triamcinolone cream 0.1 % Commonly known as: KENALOG Apply 1 application topically 2 (two) times daily.               Durable Medical Equipment  (From admission, onward)           Start     Ordered   08/15/20 1557  For home use only DME Walker rolling  Once       Question Answer  Comment  Walker: With [place]   Patient needs a walker to treat with the following condition Physical deconditioning      08/15/20 1558            Contact information for after-discharge care     Destination     [facility] .   Service: Skilled Nursing Contact information: [place] [address] [phone]                    Allergies  Allergen Reactions   Amoxicillin-Pot Clavulanate Nausea And Vomiting   Cefdinir Nausea And Vomiting    Ciprofloxacin Hives and Itching   Other     Bioxin    Sulfa Antibiotics Hives and Itching    Consultations: None   Procedures/Studies: CT HEAD WO CONTRAST  Result Date: 08/18/2020 CLINICAL DATA:  75 year old female with altered mental status. EXAM: CT HEAD WITHOUT CONTRAST TECHNIQUE: Contiguous axial images were obtained from the base of the skull through the vertex without intravenous contrast. COMPARISON:  None. FINDINGS: Brain: The ventricles and sulci are appropriate size for patient's age. The gray-white matter discrimination is preserved. There is no acute intracranial hemorrhage. No mass effect or midline shift no extra-axial fluid collection. Vascular: No hyperdense vessel or unexpected calcification. Skull: Normal. Negative for fracture or focal lesion. Sinuses/Orbits: Mild mucoperiosteal thickening of paranasal sinuses. No air-fluid level. The mastoid air cells are clear. Other: None IMPRESSION: Unremarkable noncontrast CT of the brain. Electronically Signed   By: Anner Crete M.D.   On: 08/18/2020 21:37   MR ANGIO HEAD WO CONTRAST  Result Date: 08/22/2020 CLINICAL DATA:  Mental status change. Unknown cause. Non communicative EXAM: MRI HEAD WITHOUT CONTRAST MRA HEAD WITHOUT CONTRAST TECHNIQUE: Multiplanar, multi-echo pulse sequences of the brain and surrounding structures were acquired without intravenous contrast. Angiographic images of the Circle of Willis were acquired using MRA technique without intravenous contrast. COMPARISON: No pertinent prior exam. COMPARISON:  No pertinent prior exam. FINDINGS: MRI HEAD FINDINGS Brain: No acute infarct, hemorrhage, or mass lesion is present. Mild white matter changes are within normal limits for age. The ventricles are of normal size. No significant extraaxial fluid collection is present. The internal auditory canals are within normal limits. The brainstem and cerebellum are within normal limits. Vascular: Flow is present in the major  intracranial arteries. Skull and upper cervical spine: The craniocervical junction is normal. Upper cervical spine is within normal limits. Marrow signal is unremarkable. Sinuses/Orbits: Bilateral mastoid effusions are present. No obstructing nasopharyngeal lesion is evident. Diffuse mucosal thickening is present throughout the remainder the paranasal sinuses. No fluid levels are present. Bilateral lens replacements are noted. Globes and orbits are otherwise unremarkable. MRA HEAD FINDINGS Anterior circulation: The internal carotid arteries are within normal limits from the high cervical segments through the ICA termini. The A1 and M1 segments are normal. No definite anterior communicating artery is present. There is some patient motion. ACA and MCA branch vessels are within normal limits. Posterior circulation: The left vertebral artery is the dominant vessel. PICA origins are visualized and normal. The vertebrobasilar junction is normal. Prominent posterior communicating arteries are present bilaterally. Small P1 segments are present. PCA branch vessels are not well seen due to patient motion. Anatomic variants: Prominent posterior communicating arteries bilaterally. IMPRESSION: 1. Normal MRI appearance of the brain for age.  No acute infarct. 2. Bilateral mastoid effusions. No obstructing nasopharyngeal lesion is present. 3. Normal MRA circle-of-Willis without significant proximal stenosis, aneurysm,  or branch vessel occlusion. Electronically Signed   By: San Morelle M.D.   On: 08/22/2020 13:24   MR BRAIN WO CONTRAST  Result Date: 08/22/2020 CLINICAL DATA:  Mental status change. Unknown cause. Non communicative EXAM: MRI HEAD WITHOUT CONTRAST MRA HEAD WITHOUT CONTRAST TECHNIQUE: Multiplanar, multi-echo pulse sequences of the brain and surrounding structures were acquired without intravenous contrast. Angiographic images of the Circle of Willis were acquired using MRA technique without intravenous  contrast. COMPARISON: No pertinent prior exam. COMPARISON:  No pertinent prior exam. FINDINGS: MRI HEAD FINDINGS Brain: No acute infarct, hemorrhage, or mass lesion is present. Mild white matter changes are within normal limits for age. The ventricles are of normal size. No significant extraaxial fluid collection is present. The internal auditory canals are within normal limits. The brainstem and cerebellum are within normal limits. Vascular: Flow is present in the major intracranial arteries. Skull and upper cervical spine: The craniocervical junction is normal. Upper cervical spine is within normal limits. Marrow signal is unremarkable. Sinuses/Orbits: Bilateral mastoid effusions are present. No obstructing nasopharyngeal lesion is evident. Diffuse mucosal thickening is present throughout the remainder the paranasal sinuses. No fluid levels are present. Bilateral lens replacements are noted. Globes and orbits are otherwise unremarkable. MRA HEAD FINDINGS Anterior circulation: The internal carotid arteries are within normal limits from the high cervical segments through the ICA termini. The A1 and M1 segments are normal. No definite anterior communicating artery is present. There is some patient motion. ACA and MCA branch vessels are within normal limits. Posterior circulation: The left vertebral artery is the dominant vessel. PICA origins are visualized and normal. The vertebrobasilar junction is normal. Prominent posterior communicating arteries are present bilaterally. Small P1 segments are present. PCA branch vessels are not well seen due to patient motion. Anatomic variants: Prominent posterior communicating arteries bilaterally. IMPRESSION: 1. Normal MRI appearance of the brain for age.  No acute infarct. 2. Bilateral mastoid effusions. No obstructing nasopharyngeal lesion is present. 3. Normal MRA circle-of-Willis without significant proximal stenosis, aneurysm, or branch vessel occlusion. Electronically  Signed   By: San Morelle M.D.   On: 08/22/2020 13:24   US Abdomen Complete  Result Date: 08/14/2020 CLINICAL DATA:  Nausea, vomiting, and diarrhea for 10 days, thrombocytopenia EXAM: ABDOMEN ULTRASOUND COMPLETE COMPARISON:  None FINDINGS: Gallbladder: Normally distended without stones or wall thickening. No pericholecystic fluid or sonographic Murphy sign. Common bile duct: Diameter: 3 mm, normal Liver: Normal echogenicity without mass or nodularity. Portal vein is patent on color Doppler imaging with normal direction of blood flow towards the liver. IVC: Normal appearance Pancreas: Normal appearance Spleen: Multiple calcified granulomata within spleen. Spleen 9.6 cm length. Right Kidney: Length: 10.4 cm. Normal cortical thickness and echogenicity. Tiny exophytic cyst 9 x 7 x 8 mm at mid kidney. No additional mass or hydronephrosis. Left Kidney: Length: 11.3 cm. Normal morphology without mass or hydronephrosis. Abdominal aorta: Normal caliber with mild atherosclerotic plaque formation Other findings: Small BILATERAL pleural effusions. Trace perihepatic free fluid. IMPRESSION: Calcified granulomata within spleen. Tiny RIGHT renal cyst 9 mm diameter. Small BILATERAL pleural effusions and trace perihepatic free fluid. Electronically Signed   By: Lavonia Dana M.D.   On: 08/14/2020 17:27   US RENAL  Result Date: 08/19/2020 CLINICAL DATA:  Acute renal injury EXAM: RENAL / URINARY TRACT ULTRASOUND COMPLETE COMPARISON:  08/15/2020 FINDINGS: Right Kidney: Renal measurements: 10.8 x 4.7 x 5.5 cm. = volume: 146 mL. Echogenicity within normal limits. No mass or hydronephrosis visualized. Left Kidney: Renal measurements: 11.5  x 4.8 x 4.7 cm = volume: 137 mL. Echogenicity within normal limits. No mass or hydronephrosis visualized. Bladder: Decompressed by Foley catheter. Other: Minimal ascites is noted stable from previous CT. IMPRESSION: Minimal ascites. Normal appearing kidneys bilaterally. Electronically Signed    By: Inez Catalina M.D.   On: 08/19/2020 08:35   CT CHEST ABDOMEN PELVIS W CONTRAST  Result Date: 08/15/2020 CLINICAL DATA:  Sepsis, leukopenia EXAM: CT CHEST, ABDOMEN, AND PELVIS WITH CONTRAST TECHNIQUE: Multidetector CT imaging of the chest, abdomen and pelvis was performed following the standard protocol during bolus administration of intravenous contrast. CONTRAST:  163mL OMNIPAQUE IOHEXOL 300 MG/ML  SOLN COMPARISON:  None. FINDINGS: CT CHEST FINDINGS Cardiovascular: Mild multi-vessel coronary artery calcification. Global cardiac size within normal limits. Trace pericardial effusion. Central pulmonary arteries are of normal caliber. Mild atherosclerotic calcification within the thoracic aorta. No aortic aneurysm. Mediastinum/Nodes: No enlarged mediastinal, hilar, or axillary lymph nodes. Thyroid gland, trachea, and esophagus demonstrate no significant findings. Lungs/Pleura: Small right and trace left pleural effusions are present. Trace interstitial pulmonary edema. Mild left basilar atelectasis. Resection of the superior segment of the right lower lobe has been performed. No pneumothorax. No central obstructing mass identified. Musculoskeletal: No chest wall mass or suspicious bone lesions identified. CT ABDOMEN PELVIS FINDINGS Hepatobiliary: Tiny probable cyst within the inferior right hepatic lobe. Mild periportal edema. Liver otherwise unremarkable. No intra or extrahepatic biliary ductal dilation. Gallbladder unremarkable. Pancreas: Unremarkable Spleen: Benign calcified granuloma are seen throughout the spleen. The spleen is otherwise unremarkable. Adrenals/Urinary Tract: Adrenal glands are unremarkable. There is mild bilateral hydronephrosis and marked distension of the bladder suggesting changes of bladder outlet obstruction or voluntary retention. Normal cortical enhancement the kidneys bilaterally. No intrarenal or ureteral calculi are identified. Stomach/Bowel: Mild scattered sigmoid  diverticulosis. The stomach, small bowel, and large bowel are otherwise unremarkable. Appendix normal. Mild ascites. No free intraperitoneal gas. Vascular/Lymphatic: There is mild atherosclerotic calcification within the abdominal aorta. 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 There is no significant extrinsic compression upon the left renal vein and left common iliac vein to suggest that this represents a vascular collateral. The abdominal vasculature is otherwise unremarkable. There is no pathologic adenopathy within the abdomen and pelvis. Reproductive: Uterus and bilateral adnexa are unremarkable. Other: Small fat containing umbilical hernia. Musculoskeletal: No acute bone abnormality. No lytic or blastic bone lesion is identified. IMPRESSION: No definite source identified for the patient's reported sepsis. Anasarca with mild interstitial pulmonary edema, bilateral pleural effusions, periportal edema and mild ascites. Mild multi-vessel coronary artery calcification. Mild bilateral hydronephrosis and marked distension of the bladder possibly reflecting changes of voluntary retention or bladder outlet obstruction. 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 Aortic Atherosclerosis (ICD10-I70.0). Electronically Signed   By: Fidela Salisbury MD   On: 08/15/2020 02:00   DG Chest Port 1 View  Result Date: 08/21/2020 CLINICAL DATA:  Worsening shortness of breath, history of lung cancer post RIGHT lobectomy, Parkinson's, former smoker, hiatal hernia, asthma EXAM: PORTABLE CHEST 1 VIEW COMPARISON:  Portable exam 1300 hours compared to 08/18/2020 FINDINGS: Normal heart size, mediastinal contours, and pulmonary vascularity. Atelectasis versus consolidation LEFT lower lobe, increased. Mild RIGHT basilar atelectasis and  probable small RIGHT pleural effusion. Upper lungs clear. No pneumothorax or acute osseous findings. IMPRESSION: Increased atelectasis versus consolidation LEFT lower lobe. RIGHT basilar atelectasis and  probable small RIGHT pleural effusion. Electronically Signed   By: Lavonia Dana M.D.   On: 08/21/2020 14:39   DG CHEST PORT 1 VIEW  Result Date: 08/18/2020 CLINICAL DATA:  Shortness of breath EXAM: PORTABLE CHEST 1 VIEW COMPARISON:  08/14/2020 FINDINGS: Heart size within normal limits. Small bilateral pleural effusions. Hazy right basilar opacity, which may reflect atelectasis. No pneumothorax. IMPRESSION: Small bilateral pleural effusions. Hazy right basilar opacity, which may reflect atelectasis. Electronically Signed   By: Davina Poke D.O.   On: 08/18/2020 21:25   EEG adult  Result Date: 08/22/2020 Lora Havens, MD     08/22/2020  7:25 PM Patient Name: Maicy Filip. Delahunt MRN: [id number] Epilepsy Attending: Lora Havens Referring Physician/Provider: Dr Heath Lark Date: 08/22/2020 Duration: 22.23 mins Patient history: 74yo F with ams. EEG to evaluate for seizure Level of alertness: Awake AEDs during EEG study: None Technical aspects: This EEG study was done with scalp electrodes positioned according to the 10-20 International system of electrode placement. Electrical activity was acquired at a sampling rate of 500Hz  and reviewed with a high frequency filter of 70Hz  and a low frequency filter of 1Hz . EEG data were recorded continuously and digitally stored. Description: No posterior dominant rhythm was seen. EEG showed continuous generalized 5 to 6 Hz theta as well as intermittent 2-3Hz  delta slowing, at time with triphasic morphology.  Hyperventilation and photic stimulation were not performed.   ABNORMALITY - Continuous slow, generalized IMPRESSION: This study is suggestive of moderate diffuse encephalopathy, nonspecific etiology but likely related to toxic-metabolic causes. No seizures or definite  epileptiform discharges were seen throughout the recording. Lora Havens   ECHOCARDIOGRAM COMPLETE  Result Date: 08/20/2020    ECHOCARDIOGRAM REPORT   Patient Name:   Deborah [name] Date of Exam: 08/20/2020 Medical Rec #:  [id number]        Height:       66.0 in Accession #:    [id number]       Weight:       135.4 lb Date of Birth:  07-02-1945       BSA:          1.694 m Patient Age:    51 years         BP:           123/70 mmHg Patient Gender: F                HR:           65 bpm. Exam Location:  [facility] Procedure: 2D Echo, Cardiac Doppler and Color Doppler Indications:    Acute respiratory distress  History:        Patient has no prior history of Echocardiogram examinations.                 Signs/Symptoms:Altered Mental Status and Shortness of Breath;                 Risk Factors:Hypertension. Acute resp. failure, Parkinsons,                 bilateral pleural effusions.  Sonographer:    Dustin Flock RDCS Referring Phys: [id number] Royanne Foots [place]  1. Large area of ? pleural effusion with atelecttic lung, ? ascites and hepatomegaly noted.  2. Abnormal septal motion . Left ventricular ejection fraction, by estimation, is 50 to 55%. The left ventricle has low normal function. The left ventricle has no regional wall motion abnormalities. Left ventricular diastolic parameters  were normal.  3. Right ventricular systolic function is normal. The right ventricular size is normal. There is normal pulmonary artery systolic pressure.  4. The pericardial effusion is posterior to the left ventricle and lateral to the left ventricle.  5. The mitral valve is normal in structure. Trivial mitral valve regurgitation. No evidence of mitral stenosis.  6. The aortic valve is tricuspid. Aortic valve regurgitation is trivial. Mild to moderate aortic valve sclerosis/calcification is present, without any evidence of aortic stenosis.  7. The inferior vena cava is normal in size with greater than 50% respiratory  variability, suggesting right atrial pressure of 3 mmHg. FINDINGS  Left Ventricle: Abnormal septal motion. Left ventricular ejection fraction, by estimation, is 50 to 55%. The left ventricle has low normal function. The left ventricle has no regional wall motion abnormalities. The left ventricular internal cavity size was normal in size. There is no left ventricular hypertrophy. Left ventricular diastolic parameters were normal. Right Ventricle: The right ventricular size is normal. No increase in right ventricular wall thickness. Right ventricular systolic function is normal. There is normal pulmonary artery systolic pressure. The tricuspid regurgitant velocity is 2.54 m/s, and  with an assumed right atrial pressure of 3 mmHg, the estimated right ventricular systolic pressure is 16.1 mmHg. Left Atrium: Left atrial size was normal in size. Right Atrium: Right atrial size was normal in size. Pericardium: Trivial pericardial effusion is present. The pericardial effusion is posterior to the left ventricle and lateral to the left ventricle. Mitral Valve: The mitral valve is normal in structure. Trivial mitral valve regurgitation. No evidence of mitral valve stenosis. Tricuspid Valve: The tricuspid valve is normal in structure. Tricuspid valve regurgitation is not demonstrated. No evidence of tricuspid stenosis. Aortic Valve: The aortic valve is tricuspid. Aortic valve regurgitation is trivial. Aortic regurgitation PHT measures 619 msec. Mild to moderate aortic valve sclerosis/calcification is present, without any evidence of aortic stenosis. Pulmonic Valve: The pulmonic valve was normal in structure. Pulmonic valve regurgitation is not visualized. No evidence of pulmonic stenosis. Aorta: The aortic root is normal in size and structure. Venous: The inferior vena cava is normal in size with greater than 50% respiratory variability, suggesting right atrial pressure of 3 mmHg. IAS/Shunts: The interatrial septum was not well  visualized. Additional Comments: Large area of ? pleural effusion with atelecttic lung, ? ascites and hepatomegaly noted.  LEFT VENTRICLE PLAX 2D LVIDd:         4.55 cm  Diastology LVIDs:         3.04 cm  LV e' medial:    6.75 cm/s LV PW:         1.12 cm  LV E/e' medial:  7.7 LV IVS:        0.91 cm  LV e' lateral:   12.70 cm/s LVOT diam:     1.90 cm  LV E/e' lateral: 4.1 LV SV:         51 LV SV Index:   30 LVOT Area:     2.84 cm  RIGHT VENTRICLE RV Basal diam:  2.30 cm RV S prime:     10.60 cm/s TAPSE (M-mode): 1.7 cm LEFT ATRIUM           Index       RIGHT ATRIUM           Index LA diam:      2.70 cm 1.59 cm/m  RA Area:     10.10 cm LA Vol (A2C): 14.0 ml 8.26 ml/m  RA Volume:   19.30 ml  11.39 ml/m LA Vol (A4C): 40.2 ml 23.73 ml/m  AORTIC VALVE LVOT Vmax:   80.90 cm/s LVOT Vmean:  56.500 cm/s LVOT VTI:    0.181 m AI PHT:      619 msec  AORTA Ao Root diam: 3.00 cm MITRAL VALVE               TRICUSPID VALVE MV Area (PHT): 3.43 cm    TR Peak grad:   25.8 mmHg MV Decel Time: 221 msec    TR Vmax:        254.00 cm/s MV E velocity: 52.00 cm/s MV A velocity: 79.00 cm/s  SHUNTS MV E/A ratio:  0.66        Systemic VTI:  0.18 m                            Systemic Diam: 1.90 cm Jenkins Rouge MD Electronically signed by Jenkins Rouge MD Signature Date/Time: 08/20/2020/4:18:04 PM    Final      Discharge Exam: Vitals:   08/24/20 2055 08/25/20 0529  BP: 137/74 (!) 146/76  Pulse: 61 68  Resp: 18 18  Temp: 97.6 F (36.4 C) 97.7 F (36.5 C)  SpO2: 100% 98%   Vitals:   08/24/20 1018 08/24/20 1442 08/24/20 2055 08/25/20 0529  BP: 133/78 (!) 141/78 137/74 (!) 146/76  Pulse: 70 63 61 68  Resp: 15 18 18 18   Temp: 97.7 F (36.5 C) 97.8 F (36.6 C) 97.6 F (36.4 C) 97.7 F (36.5 C)  TempSrc: Oral Oral Oral Oral  SpO2: 99% 98% 100% 98%  Weight:      Height:        General: Pt is alert, awake, not in acute distress Cardiovascular: RRR, S1/S2 +, no rubs, no gallops Respiratory: CTA bilaterally, no wheezing,  no rhonchi Abdominal: Soft, NT, ND, bowel sounds + Extremities: no edema, no cyanosis    The results of significant diagnostics from this hospitalization (including imaging, microbiology, ancillary and laboratory) are listed below for reference.     Microbiology: Recent Results (from the past 240 hour(s))  C Difficile Quick Screen w PCR reflex     Status: None   Collection Time: 08/16/20 10:00 PM   Specimen: STOOL  Result Value Ref Range Status   C Diff antigen NEGATIVE NEGATIVE Final   C Diff toxin NEGATIVE NEGATIVE Final   C Diff interpretation No C. difficile detected.  Final    Comment: Performed at [facility], [address], [place], [place] [address]  Culture, blood (routine x 2)     Status: None   Collection Time: 08/19/20 11:26 AM   Specimen: BLOOD  Result Value Ref Range Status   Specimen Description BLOOD BLOOD LEFT HAND  Final   Special Requests   Final    Blood Culture adequate volume BOTTLES DRAWN AEROBIC AND ANAEROBIC   Culture   Final    NO GROWTH 5 DAYS Performed at [facility], [address], [place], [place] [address]    Report Status 08/24/2020 FINAL  Final  Culture, blood (routine x 2)     Status: None   Collection Time: 08/19/20 11:26 AM   Specimen: BLOOD  Result Value Ref Range Status   Specimen Description BLOOD LEFT ANTECUBITAL  Final   Special Requests   Final    Blood Culture adequate volume BOTTLES DRAWN AEROBIC AND ANAEROBIC   Culture   Final  NO GROWTH 5 DAYS Performed at [facility], [address] [place], [place] [address]    Report Status 08/24/2020 FINAL  Final  MRSA Next Gen by PCR, Nasal     Status: None   Collection Time: 08/19/20 11:49 AM  Result Value Ref Range Status   MRSA by PCR Next Gen NOT DETECTED NOT DETECTED Final    Comment: (NOTE) The GeneXpert MRSA Assay (FDA approved for NASAL specimens only), is one component of a comprehensive MRSA colonization surveillance program. It is not intended to diagnose MRSA  infection nor to guide or monitor treatment for MRSA infections. Test performance is not FDA approved in patients less than 66 years old. Performed at [facility], [address] [place], [place] [address]   Resp Panel by RT-PCR (Flu A&B, Covid) Nasopharyngeal Swab     Status: None   Collection Time: 08/25/20 10:16 AM   Specimen: Nasopharyngeal Swab; Nasopharyngeal(NP) swabs in vial transport medium  Result Value Ref Range Status   SARS Coronavirus 2 by RT PCR NEGATIVE NEGATIVE Final    Comment: (NOTE) SARS-CoV-2 target nucleic acids are NOT DETECTED.  The SARS-CoV-2 RNA is generally detectable in upper respiratory specimens during the acute phase of infection. The lowest concentration of SARS-CoV-2 viral copies this assay can detect is 138 copies/mL. A negative result does not preclude SARS-Cov-2 infection and should not be used as the sole basis for treatment or other patient management decisions. A negative result may occur with  improper specimen collection/handling, submission of specimen other than nasopharyngeal swab, presence of viral mutation(s) within the areas targeted by this assay, and inadequate number of viral copies(<138 copies/mL). A negative result must be combined with clinical observations, patient history, and epidemiological information. The expected result is Negative.  Fact Sheet for Patients:  EntrepreneurPulse.com.au  Fact Sheet for Healthcare Providers:  IncredibleEmployment.be  This test is no t yet approved or cleared by the [place] FDA and  has been authorized for detection and/or diagnosis of SARS-CoV-2 by FDA under an Emergency Use Authorization (EUA). This EUA will remain  in effect (meaning this test can be used) for the duration of the COVID-19 declaration under Section 564(b)(1) of the Act, 21 U.S.C.section 360bbb-3(b)(1), unless the authorization is terminated  or revoked sooner.       Influenza A  by PCR NEGATIVE NEGATIVE Final   Influenza B by PCR NEGATIVE NEGATIVE Final    Comment: (NOTE) The Xpert Xpress SARS-CoV-2/FLU/RSV plus assay is intended as an aid in the diagnosis of influenza from Nasopharyngeal swab specimens and should not be used as a sole basis for treatment. Nasal washings and aspirates are unacceptable for Xpert Xpress SARS-CoV-2/FLU/RSV testing.  Fact Sheet for Patients: EntrepreneurPulse.com.au  Fact Sheet for Healthcare Providers: IncredibleEmployment.be  This test is not yet approved or cleared by the [place] FDA and has been authorized for detection and/or diagnosis of SARS-CoV-2 by FDA under an Emergency Use Authorization (EUA). This EUA will remain in effect (meaning this test can be used) for the duration of the COVID-19 declaration under Section 564(b)(1) of the Act, 21 U.S.C. section 360bbb-3(b)(1), unless the authorization is terminated or revoked.  Performed at [facility], [address] [place], [place] [address]      Labs: BNP (last 3 results) Recent Labs    08/19/20 0503  BNP 06.2   Basic Metabolic Panel: Recent Labs  Lab 08/20/20 0516 08/21/20 0510 08/22/20 6948 08/23/20 0654 08/24/20 0647 08/25/20 0723  NA 134* 136 138 140 140  --  K 3.0* 3.7 3.6 3.6 3.7  --   CL 116* 116* 115* 116* 113*  --   CO2 13* 18* 19* 19* 23  --   GLUCOSE 68* 215* 83 102* 102*  --   BUN 30* 30* 29* 28* 29*  --   CREATININE 1.32* 1.36* 1.20* 1.07* 0.94 0.81  CALCIUM 5.1* 7.0* 7.3* 7.3* 7.4*  --   MG 1.5* 2.1 2.2 2.1 2.0  --   PHOS 4.6  --   --   --   --   --    Liver Function Tests: Recent Labs  Lab 08/22/20 0638 08/23/20 0654 08/24/20 0647  AST 167* 138* 105*  ALT 44 27 13  ALKPHOS 370* 383* 358*  BILITOT 0.8 0.8 0.8  PROT 4.6* 4.8* 4.8*  ALBUMIN 1.8* 1.9* 1.9*   No results for input(s): LIPASE, AMYLASE in the last 168 hours. Recent Labs  Lab 08/19/20 1126  AMMONIA 28   CBC: Recent  Labs  Lab 08/19/20 0503 08/20/20 0516 08/21/20 0510 08/22/20 0638 08/23/20 0654  WBC 7.7 6.1 5.6 7.2 8.2  HGB 12.7 10.5* 12.1 13.2 13.0  HCT 36.6 30.6* 35.7* 38.7 37.7  MCV 89.5 91.3 89.7 88.8 88.7  PLT 53* 44* 63* 72* 85*   Cardiac Enzymes: No results for input(s): CKTOTAL, CKMB, CKMBINDEX, TROPONINI in the last 168 hours. BNP: Invalid input(s): POCBNP CBG: Recent Labs  Lab 08/20/20 0817  GLUCAP 124*   D-Dimer No results for input(s): DDIMER in the last 72 hours. Hgb A1c No results for input(s): HGBA1C in the last 72 hours. Lipid Profile No results for input(s): CHOL, HDL, LDLCALC, TRIG, CHOLHDL, LDLDIRECT in the last 72 hours. Thyroid function studies No results for input(s): TSH, T4TOTAL, T3FREE, THYROIDAB in the last 72 hours.  Invalid input(s): FREET3 Anemia work up No results for input(s): VITAMINB12, FOLATE, FERRITIN, TIBC, IRON, RETICCTPCT in the last 72 hours. Urinalysis    Component Value Date/Time   COLORURINE YELLOW 08/14/2020 1108   APPEARANCEUR CLEAR 08/14/2020 1108   LABSPEC 1.015 08/14/2020 1108   PHURINE 6.0 08/14/2020 1108   GLUCOSEU NEGATIVE 08/14/2020 1108   HGBUR SMALL (A) 08/14/2020 1108   BILIRUBINUR NEGATIVE 08/14/2020 1108   KETONESUR 5 (A) 08/14/2020 1108   PROTEINUR 100 (A) 08/14/2020 1108   NITRITE NEGATIVE 08/14/2020 1108   LEUKOCYTESUR NEGATIVE 08/14/2020 1108   Sepsis Labs Invalid input(s): PROCALCITONIN,  WBC,  LACTICIDVEN Microbiology Recent Results (from the past 240 hour(s))  C Difficile Quick Screen w PCR reflex     Status: None   Collection Time: 08/16/20 10:00 PM   Specimen: STOOL  Result Value Ref Range Status   C Diff antigen NEGATIVE NEGATIVE Final   C Diff toxin NEGATIVE NEGATIVE Final   C Diff interpretation No C. difficile detected.  Final    Comment: Performed at [facility], [address], [place], [place] [address]  Culture, blood (routine x 2)     Status: None   Collection Time: 08/19/20 11:26 AM    Specimen: BLOOD  Result Value Ref Range Status   Specimen Description BLOOD BLOOD LEFT HAND  Final   Special Requests   Final    Blood Culture adequate volume BOTTLES DRAWN AEROBIC AND ANAEROBIC   Culture   Final    NO GROWTH 5 DAYS Performed at [facility], [address] [place], [place] [address]    Report Status 08/24/2020 FINAL  Final  Culture, blood (routine x 2)     Status: None  Collection Time: 08/19/20 11:26 AM   Specimen: BLOOD  Result Value Ref Range Status   Specimen Description BLOOD LEFT ANTECUBITAL  Final   Special Requests   Final    Blood Culture adequate volume BOTTLES DRAWN AEROBIC AND ANAEROBIC   Culture   Final    NO GROWTH 5 DAYS Performed at [facility], [address] [place], [place] [address]    Report Status 08/24/2020 FINAL  Final  MRSA Next Gen by PCR, Nasal     Status: None   Collection Time: 08/19/20 11:49 AM  Result Value Ref Range Status   MRSA by PCR Next Gen NOT DETECTED NOT DETECTED Final    Comment: (NOTE) The GeneXpert MRSA Assay (FDA approved for NASAL specimens only), is one component of a comprehensive MRSA colonization surveillance program. It is not intended to diagnose MRSA infection nor to guide or monitor treatment for MRSA infections. Test performance is not FDA approved in patients less than 64 years old. Performed at [facility], [address], [place], [place] [address]   Resp Panel by RT-PCR (Flu A&B, Covid) Nasopharyngeal Swab     Status: None   Collection Time: 08/25/20 10:16 AM   Specimen: Nasopharyngeal Swab; Nasopharyngeal(NP) swabs in vial transport medium  Result Value Ref Range Status   SARS Coronavirus 2 by RT PCR NEGATIVE NEGATIVE Final    Comment: (NOTE) SARS-CoV-2 target nucleic acids are NOT DETECTED.  The SARS-CoV-2 RNA is generally detectable in upper respiratory specimens during the acute phase of infection. The lowest concentration of SARS-CoV-2 viral copies this assay can detect is 138 copies/mL. A  negative result does not preclude SARS-Cov-2 infection and should not be used as the sole basis for treatment or other patient management decisions. A negative result may occur with  improper specimen collection/handling, submission of specimen other than nasopharyngeal swab, presence of viral mutation(s) within the areas targeted by this assay, and inadequate number of viral copies(<138 copies/mL). A negative result must be combined with clinical observations, patient history, and epidemiological information. The expected result is Negative.  Fact Sheet for Patients:  EntrepreneurPulse.com.au  Fact Sheet for Healthcare Providers:  IncredibleEmployment.be  This test is no t yet approved or cleared by the [place] FDA and  has been authorized for detection and/or diagnosis of SARS-CoV-2 by FDA under an Emergency Use Authorization (EUA). This EUA will remain  in effect (meaning this test can be used) for the duration of the COVID-19 declaration under Section 564(b)(1) of the Act, 21 U.S.C.section 360bbb-3(b)(1), unless the authorization is terminated  or revoked sooner.       Influenza A by PCR NEGATIVE NEGATIVE Final   Influenza B by PCR NEGATIVE NEGATIVE Final    Comment: (NOTE) The Xpert Xpress SARS-CoV-2/FLU/RSV plus assay is intended as an aid in the diagnosis of influenza from Nasopharyngeal swab specimens and should not be used as a sole basis for treatment. Nasal washings and aspirates are unacceptable for Xpert Xpress SARS-CoV-2/FLU/RSV testing.  Fact Sheet for Patients: EntrepreneurPulse.com.au  Fact Sheet for Healthcare Providers: IncredibleEmployment.be  This test is not yet approved or cleared by the [place] FDA and has been authorized for detection and/or diagnosis of SARS-CoV-2 by FDA under an Emergency Use Authorization (EUA). This EUA will remain in effect (meaning this test can  be used) for the duration of the COVID-19 declaration under Section 564(b)(1) of the Act, 21 U.S.C. section 360bbb-3(b)(1), unless the authorization is terminated or revoked.  Performed at [facility], [place]  [address], [place], [place] [address]      Time coordinating discharge: 35 minutes  SIGNED:   Rodena Goldmann, DO [facility] 08/25/2020, 1:55 PM  If 7PM-7AM, please contact night-coverage [email or web address]

## 2020-08-25 NOTE — Care Management Important Message (Signed)
Important Message  Patient Details  Name: Deborah [name]. Leder MRN: [id number] Date of Birth: [birth year]   Medicare Important Message Given:  Yes     Boneta Lucks, RN 08/25/2020, 11:44 AM

## 2020-08-26 LAB — LYME DISEASE DNA BY PCR(BORRELIA BURG): Lyme Disease(B.burgdorferi)PCR: NEGATIVE

## 2020-09-04 ENCOUNTER — Ambulatory Visit: Payer: Medicare Other | Admitting: Family Medicine

## 2020-09-04 ENCOUNTER — Ambulatory Visit (INDEPENDENT_AMBULATORY_CARE_PROVIDER_SITE_OTHER): Payer: Medicare Other | Admitting: Family Medicine

## 2020-09-04 ENCOUNTER — Encounter: Payer: Self-pay | Admitting: Family Medicine

## 2020-09-04 ENCOUNTER — Other Ambulatory Visit: Payer: Self-pay

## 2020-09-04 VITALS — BP 132/77 | HR 94 | Temp 97.8°F | Ht 66.0 in | Wt 119.0 lb

## 2020-09-04 DIAGNOSIS — R634 Abnormal weight loss: Secondary | ICD-10-CM | POA: Diagnosis not present

## 2020-09-04 DIAGNOSIS — F411 Generalized anxiety disorder: Secondary | ICD-10-CM

## 2020-09-04 DIAGNOSIS — R197 Diarrhea, unspecified: Secondary | ICD-10-CM

## 2020-09-04 DIAGNOSIS — R531 Weakness: Secondary | ICD-10-CM

## 2020-09-04 DIAGNOSIS — Z87448 Personal history of other diseases of urinary system: Secondary | ICD-10-CM

## 2020-09-04 DIAGNOSIS — R112 Nausea with vomiting, unspecified: Secondary | ICD-10-CM

## 2020-09-04 DIAGNOSIS — R682 Dry mouth, unspecified: Secondary | ICD-10-CM | POA: Diagnosis not present

## 2020-09-04 DIAGNOSIS — I700 Atherosclerosis of aorta: Secondary | ICD-10-CM

## 2020-09-04 DIAGNOSIS — D696 Thrombocytopenia, unspecified: Secondary | ICD-10-CM

## 2020-09-04 MED ORDER — BUSPIRONE HCL 7.5 MG PO TABS: 7.5000 mg | ORAL_TABLET | Freq: Two times a day (BID) | ORAL | 2 refills | Status: DC | PRN

## 2020-09-04 NOTE — Patient Instructions (Signed)
Use Biotene before meals.

## 2020-09-04 NOTE — Progress Notes (Signed)
Assessment & Plan:  1. Generalized weakness Referring to home health since she did not stay at the SNF facility to get physical therapy.  - Ambulatory referral to Home Health  2. Mouth dryness Encouraged her to use Biotene before meals.  3. Weight loss Down 6 lbs from her last visit here. Hospital weights were higher than what we are seeing. She is drinking Ensure supplements. Discussed I always want her to try to eat first and then if she doesn't eat well, she needs to drink a supplement.   4. Nausea vomiting and diarrhea Resolved.  - CBC with Differential/Platelet  5. History of acute renal failure Labs to assess for improvement. - CMP14+EGFR - Parathyroid hormone, intact (no Ca)  6. Thrombocytopenia ([place]) Labs to assess for improvement. - CBC with Differential/Platelet  7. Generalized anxiety disorder Patient started on BuSpar as needed. Discussed if she needs twice daily every day, that is okay. - CMP14+EGFR - busPIRone (BUSPAR) 7.5 MG tablet; Take 1 tablet (7.5 mg total) by mouth 2 (two) times daily as needed.  Dispense: 60 tablet; Refill: 2  8. Aortic atherosclerosis (HCC) Needs statin. Will discuss at our next visit.    Return in about 2 months (around 11/04/2020) for follow-up of chronic medication conditions.  Hendricks Limes, MSN, APRN, FNP-C [facility]  Subjective:    Patient ID: Deborah [name], female    DOB: October 02, 1945, 75 y.o.   MRN: [id number]  Patient Care Team: Loman Brooklyn, FNP as PCP - General (Family Medicine)   Chief Complaint:  Chief Complaint  Patient presents with   Hospitalization Follow-up    6/9 AP- sepsis     HPI: Deborah [name] is a 75 y.o. female presenting on 09/04/2020 for Hospitalization Follow-up (6/9 AP- sepsis )  Patient is accompanied by her husband, Marden Noble, who she is okay with being present.   Patient was admitted to [facility] from 08/14/2020-08/25/2020 due to gastroenteritis. She was having  symptoms of nausea, vomiting, and diarrhea that started 2 weeks prior to going to the hospital. She was admitted with SIRS and [name] with electrolyte abnormalities.  The following studies did not show any acute abnormalities: EEG, brain MRI, renal u/s, and head CT. Her CT of the chest, abdomen, and pelvis did not show a source of her symptoms, but did how pleural effusions, mild ascites, mild CAD, mild hydronephrosis, mild dilation of left gonadal vein, and aortic atherosclerosis. It was recommended by PT she be discharged to SNF where she reports she signed herself out of because it was not a good place.   Today patient reports she has been trying to get more active at home. She is having a hard time eating due to a dry mouth since she was in the hospital. She has Biotene spray and rinse which she is using multiple times per day (after meals). She does feel this is getting a little better.   Anxiety: patient has successfully weaned off clonazepam. She does report feeling a little worse, but thinks it is due to her being in the hospital.   Depression screen [facility] 2/9 09/04/2020 07/23/2020  Decreased Interest 1 0  Down, Depressed, Hopeless 1 0  PHQ - 2 Score 2 0  Altered sleeping 1 0  Tired, decreased energy 1 0  Change in appetite 1 0  Feeling bad or failure about yourself  1 0  Trouble concentrating 1 0  Moving slowly or fidgety/restless 1 0  Suicidal thoughts 1 0  PHQ-9 Score 9 0  Difficult doing work/chores Somewhat difficult Not difficult at all   GAD 7 : Generalized Anxiety Score 09/04/2020 07/23/2020  Nervous, Anxious, on Edge 1 0  Control/stop worrying 1 0  Worry too much - different things 1 0  Trouble relaxing 1 0  Restless 1 0  Easily annoyed or irritable 1 0  Afraid - awful might happen 1 0  Total GAD 7 Score 7 0  Anxiety Difficulty Somewhat difficult Not difficult at all   New complaints: None  Social history:  Relevant past medical, surgical, family and social history  reviewed and updated as indicated. Interim medical history since our last visit reviewed.  Allergies and medications reviewed and updated.  DATA REVIEWED: CHART IN EPIC  ROS: Negative unless specifically indicated above in HPI.    Current Outpatient Medications:    atenolol (TENORMIN) 25 MG tablet, Take 25 mg by mouth daily., Disp: , Rfl:    carbidopa-levodopa (SINEMET IR) 25-100 MG tablet, Take 2 tablets by mouth in the morning, at noon, and at bedtime., Disp: 180 tablet, Rfl: 2   escitalopram (LEXAPRO) 20 MG tablet, Take 1 tablet by mouth daily at 2 PM., Disp: , Rfl:    feeding supplement (ENSURE ENLIVE / ENSURE PLUS) LIQD, Take 237 mLs by mouth 3 (three) times daily between meals., Disp: 237 mL, Rfl: 12   fluticasone (FLONASE) 50 MCG/ACT nasal spray, Place 1 spray into both nostrils daily., Disp: , Rfl:    omeprazole (PRILOSEC) 20 MG capsule, Take 1 capsule by mouth daily at 2 PM., Disp: , Rfl:    triamcinolone cream (KENALOG) 0.1 %, Apply 1 application topically 2 (two) times daily., Disp: 80 g, Rfl: 1   Allergies  Allergen Reactions   Sulfa Antibiotics Hives and Itching   Amoxicillin-Pot Clavulanate Nausea And Vomiting   Cefdinir Nausea And Vomiting   Ciprofloxacin Hives and Itching   Other     Bioxin    Past Medical History:  Diagnosis Date   Anxiety    Arthritis    Asthma    Cancer ([place]) 2020   lung cancer    GERD (gastroesophageal reflux disease)    Hiatal hernia    Hyperlipidemia    Osteopenia    Parkinson's syndrome ([place])    Raynaud's disease     Past Surgical History:  Procedure Laterality Date   BACK SURGERY  1995   Decompress disc lumbar   BREAST BIOPSY  1995   CATARACT EXTRACTION, BILATERAL Bilateral 2017   COLONOSCOPY  08/20/2015   repeat in 5 years   ESOPHAGOGASTRODUODENOSCOPY  2021   LUNG REMOVAL, PARTIAL Right    REFRACTIVE SURGERY Left 08/24/2016    Social History   Socioeconomic History   Marital status: Married    Spouse name: Not on file    Number of children: Not on file   Years of education: Not on file   Highest education level: Not on file  Occupational History   Not on file  Tobacco Use   Smoking status: Former    Pack years: 0.00    Types: Cigarettes    Quit date: 07/23/1960    Years since quitting: 60.1   Smokeless tobacco: Never  Vaping Use   Vaping Use: Never used  Substance and Sexual Activity   Alcohol use: Yes    Comment: occ   Drug use: Never   Sexual activity: Yes    Partners: Male    Birth control/protection: None  Other Topics Concern  Not on file  Social History Narrative   Not on file   Social Determinants of Health   Financial Resource Strain: Not on file  Food Insecurity: Not on file  Transportation Needs: Not on file  Physical Activity: Not on file  Stress: Not on file  Social Connections: Not on file  Intimate Partner Violence: Not on file        Objective:    BP 132/77   Pulse 94   Temp 97.8 F (36.6 C) (Temporal)   Ht 5' 6" (1.676 m)   Wt 119 lb (54 kg)   SpO2 95%   BMI 19.21 kg/m   Wt Readings from Last 3 Encounters:  09/04/20 119 lb (54 kg)  08/24/20 139 lb 8.8 oz (63.3 kg)  07/23/20 125 lb 9.6 oz (57 kg)    Physical Exam Vitals reviewed.  Constitutional:      General: She is not in acute distress.    Appearance: Normal appearance. She is not ill-appearing, toxic-appearing or diaphoretic.  HENT:     Head: Normocephalic and atraumatic.  Eyes:     General: No scleral icterus.       Right eye: No discharge.        Left eye: No discharge.     Conjunctiva/sclera: Conjunctivae normal.  Cardiovascular:     Rate and Rhythm: Normal rate and regular rhythm.     Heart sounds: Normal heart sounds. No murmur heard.   No friction rub. No gallop.  Pulmonary:     Effort: Pulmonary effort is normal. No respiratory distress.     Breath sounds: Normal breath sounds. No stridor. No wheezing, rhonchi or rales.  Musculoskeletal:        General: Normal range of motion.      Cervical back: Normal range of motion.  Skin:    General: Skin is warm and dry.     Capillary Refill: Capillary refill takes less than 2 seconds.  Neurological:     General: No focal deficit present.     Mental Status: She is alert and oriented to person, place, and time. Mental status is at baseline.     Gait: Gait abnormal (walking with walker).  Psychiatric:        Mood and Affect: Mood normal.        Behavior: Behavior normal.        Thought Content: Thought content normal.        Judgment: Judgment normal.    No results found for: TSH Lab Results  Component Value Date   WBC 8.2 08/23/2020   HGB 13.0 08/23/2020   HCT 37.7 08/23/2020   MCV 88.7 08/23/2020   PLT 85 (L) 08/23/2020   Lab Results  Component Value Date   NA 140 08/24/2020   K 3.7 08/24/2020   CO2 23 08/24/2020   GLUCOSE 102 (H) 08/24/2020   BUN 29 (H) 08/24/2020   CREATININE 0.81 08/25/2020   BILITOT 0.8 08/24/2020   ALKPHOS 358 (H) 08/24/2020   AST 105 (H) 08/24/2020   ALT 13 08/24/2020   PROT 4.8 (L) 08/24/2020   ALBUMIN 1.9 (L) 08/24/2020   CALCIUM 7.4 (L) 08/24/2020   ANIONGAP 4 (L) 08/24/2020   No results found for: CHOL No results found for: HDL No results found for: LDLCALC No results found for: TRIG No results found for: CHOLHDL No results found for: HGBA1C

## 2020-09-05 ENCOUNTER — Encounter: Payer: Self-pay | Admitting: Family Medicine

## 2020-09-05 LAB — CBC WITH DIFFERENTIAL/PLATELET
Basophils Absolute: 0.1 10*3/uL (ref 0.0–0.2)
Basos: 1 %
EOS (ABSOLUTE): 0 10*3/uL (ref 0.0–0.4)
Eos: 0 %
Hematocrit: 32.9 % — ABNORMAL LOW (ref 34.0–46.6)
Hemoglobin: 10.7 g/dL — ABNORMAL LOW (ref 11.1–15.9)
Immature Grans (Abs): 0.1 10*3/uL (ref 0.0–0.1)
Immature Granulocytes: 1 %
Lymphocytes Absolute: 2.1 10*3/uL (ref 0.7–3.1)
Lymphs: 18 %
MCH: 29.6 pg (ref 26.6–33.0)
MCHC: 32.5 g/dL (ref 31.5–35.7)
MCV: 91 fL (ref 79–97)
Monocytes Absolute: 1.7 10*3/uL — ABNORMAL HIGH (ref 0.1–0.9)
Monocytes: 15 %
Neutrophils Absolute: 7.4 10*3/uL — ABNORMAL HIGH (ref 1.4–7.0)
Neutrophils: 65 %
Platelets: 297 10*3/uL (ref 150–450)
RBC: 3.62 x10E6/uL — ABNORMAL LOW (ref 3.77–5.28)
RDW: 14.4 % (ref 11.7–15.4)
WBC: 11.3 10*3/uL — ABNORMAL HIGH (ref 3.4–10.8)

## 2020-09-05 LAB — CMP14+EGFR
ALT: 18 IU/L (ref 0–32)
AST: 26 IU/L (ref 0–40)
Albumin/Globulin Ratio: 0.9 — ABNORMAL LOW (ref 1.2–2.2)
Albumin: 3.3 g/dL — ABNORMAL LOW (ref 3.7–4.7)
Alkaline Phosphatase: 134 IU/L — ABNORMAL HIGH (ref 44–121)
BUN/Creatinine Ratio: 23 (ref 12–28)
BUN: 14 mg/dL (ref 8–27)
Bilirubin Total: 0.6 mg/dL (ref 0.0–1.2)
CO2: 32 mmol/L — ABNORMAL HIGH (ref 20–29)
Calcium: 7.8 mg/dL — ABNORMAL LOW (ref 8.7–10.3)
Chloride: 94 mmol/L — ABNORMAL LOW (ref 96–106)
Creatinine, Ser: 0.6 mg/dL (ref 0.57–1.00)
Globulin, Total: 3.5 g/dL (ref 1.5–4.5)
Glucose: 106 mg/dL — ABNORMAL HIGH (ref 65–99)
Potassium: 2.7 mmol/L — ABNORMAL LOW (ref 3.5–5.2)
Sodium: 141 mmol/L (ref 134–144)
Total Protein: 6.8 g/dL (ref 6.0–8.5)
eGFR: 95 mL/min/{1.73_m2} (ref >59)

## 2020-09-05 LAB — PARATHYROID HORMONE, INTACT (NO CA): PTH: 17 pg/mL (ref 15–65)

## 2020-09-05 NOTE — Telephone Encounter (Signed)
[name]- I do not know this patient=. Will you please address.

## 2020-09-08 DIAGNOSIS — I700 Atherosclerosis of aorta: Secondary | ICD-10-CM | POA: Insufficient documentation

## 2020-09-09 ENCOUNTER — Other Ambulatory Visit: Payer: Self-pay | Admitting: Family Medicine

## 2020-09-09 DIAGNOSIS — E876 Hypokalemia: Secondary | ICD-10-CM

## 2020-09-09 DIAGNOSIS — D649 Anemia, unspecified: Secondary | ICD-10-CM

## 2020-09-09 MED ORDER — POTASSIUM CHLORIDE CRYS ER 20 MEQ PO TBCR: 20.0000 meq | EXTENDED_RELEASE_TABLET | Freq: Every day | ORAL | 0 refills | Status: DC

## 2020-09-12 ENCOUNTER — Other Ambulatory Visit: Payer: Self-pay

## 2020-09-12 ENCOUNTER — Other Ambulatory Visit: Payer: Medicare Other

## 2020-09-12 DIAGNOSIS — D649 Anemia, unspecified: Secondary | ICD-10-CM

## 2020-09-12 DIAGNOSIS — E876 Hypokalemia: Secondary | ICD-10-CM

## 2020-09-13 LAB — CBC WITH DIFFERENTIAL/PLATELET
Basophils Absolute: 0.1 10*3/uL (ref 0.0–0.2)
Basos: 1 %
EOS (ABSOLUTE): 0.1 10*3/uL (ref 0.0–0.4)
Eos: 2 %
Hematocrit: 34.5 % (ref 34.0–46.6)
Hemoglobin: 11.1 g/dL (ref 11.1–15.9)
Immature Grans (Abs): 0 10*3/uL (ref 0.0–0.1)
Immature Granulocytes: 1 %
Lymphocytes Absolute: 1.8 10*3/uL (ref 0.7–3.1)
Lymphs: 28 %
MCH: 29.7 pg (ref 26.6–33.0)
MCHC: 32.2 g/dL (ref 31.5–35.7)
MCV: 92 fL (ref 79–97)
Monocytes Absolute: 0.7 10*3/uL (ref 0.1–0.9)
Monocytes: 10 %
Neutrophils Absolute: 3.7 10*3/uL (ref 1.4–7.0)
Neutrophils: 58 %
Platelets: 492 10*3/uL — ABNORMAL HIGH (ref 150–450)
RBC: 3.74 x10E6/uL — ABNORMAL LOW (ref 3.77–5.28)
RDW: 14.5 % (ref 11.7–15.4)
WBC: 6.4 10*3/uL (ref 3.4–10.8)

## 2020-09-13 LAB — CMP14+EGFR
ALT: 15 IU/L (ref 0–32)
AST: 41 IU/L — ABNORMAL HIGH (ref 0–40)
Albumin/Globulin Ratio: 1 — ABNORMAL LOW (ref 1.2–2.2)
Albumin: 3.7 g/dL (ref 3.7–4.7)
Alkaline Phosphatase: 112 IU/L (ref 44–121)
BUN/Creatinine Ratio: 9 — ABNORMAL LOW (ref 12–28)
BUN: 6 mg/dL — ABNORMAL LOW (ref 8–27)
Bilirubin Total: 0.4 mg/dL (ref 0.0–1.2)
CO2: 21 mmol/L (ref 20–29)
Calcium: 9.4 mg/dL (ref 8.7–10.3)
Chloride: 101 mmol/L (ref 96–106)
Creatinine, Ser: 0.64 mg/dL (ref 0.57–1.00)
Globulin, Total: 3.6 g/dL (ref 1.5–4.5)
Glucose: 149 mg/dL — ABNORMAL HIGH (ref 65–99)
Potassium: 4.4 mmol/L (ref 3.5–5.2)
Sodium: 140 mmol/L (ref 134–144)
Total Protein: 7.3 g/dL (ref 6.0–8.5)
eGFR: 93 mL/min/{1.73_m2} (ref >59)

## 2020-09-17 ENCOUNTER — Ambulatory Visit (INDEPENDENT_AMBULATORY_CARE_PROVIDER_SITE_OTHER): Payer: Medicare Other

## 2020-09-17 VITALS — Ht 66.0 in | Wt 119.0 lb

## 2020-09-17 DIAGNOSIS — Z0000 Encounter for general adult medical examination without abnormal findings: Secondary | ICD-10-CM

## 2020-09-17 NOTE — Progress Notes (Signed)
Subjective:   Deborah [name]. Deborah [name] is a 75 y.o. female who presents for an Initial Medicare Annual Wellness Visit.  Virtual Visit via Telephone Note  I connected with  Deborah [name]. [place] on 09/17/20 at  9:00 AM EDT by telephone and verified that I am speaking with the correct person using two identifiers.  Location: Patient: Home Provider: [facility] Persons participating in the virtual visit: patient/Nurse Health Advisor   I discussed the limitations, risks, security and privacy concerns of performing an evaluation and management service by telephone and the availability of in person appointments. The patient expressed understanding and agreed to proceed.  Interactive audio and video telecommunications were attempted between this nurse and patient, however failed, due to patient having technical difficulties OR patient did not have access to video capability.  We continued and completed visit with audio only.  Some vital signs may be absent or patient reported.   Orian Figueira E Aliene Tamura, LPN   Review of Systems     Cardiac Risk Factors include: advanced age (>68men, >49 women);hypertension;Other (see comment), Risk factor comments: underweight - nutritional risks     Objective:    Today's Vitals   09/17/20 0909  Weight: 119 lb (54 kg)  Height: 5\' 6"  (1.676 m)   Body mass index is 19.21 kg/m.  Advanced Directives 09/17/2020 08/14/2020 08/14/2020  Does Patient Have a Medical Advance Directive? No No No  Would patient like information on creating a medical advance directive? No - Patient declined No - Patient declined No - Patient declined    Current Medications (verified) Outpatient Encounter Medications as of 09/17/2020  Medication Sig   atenolol (TENORMIN) 25 MG tablet Take 25 mg by mouth daily.   busPIRone (BUSPAR) 7.5 MG tablet Take 1 tablet (7.5 mg total) by mouth 2 (two) times daily as needed.   carbidopa-levodopa (SINEMET IR) 25-100 MG tablet Take 2 tablets by mouth in the morning, at  noon, and at bedtime.   escitalopram (LEXAPRO) 20 MG tablet Take 1 tablet by mouth daily at 2 PM.   feeding supplement (ENSURE ENLIVE / ENSURE PLUS) LIQD Take 237 mLs by mouth 3 (three) times daily between meals.   fluticasone (FLONASE) 50 MCG/ACT nasal spray Place 1 spray into both nostrils daily.   omeprazole (PRILOSEC) 20 MG capsule Take 1 capsule by mouth daily at 2 PM.   potassium chloride SA (KLOR-CON) 20 MEQ tablet Take 1 tablet (20 mEq total) by mouth daily for 3 days.   triamcinolone cream (KENALOG) 0.1 % Apply 1 application topically 2 (two) times daily.   No facility-administered encounter medications on file as of 09/17/2020.    Allergies (verified) Sulfa antibiotics, Amoxicillin-pot clavulanate, Cefdinir, Ciprofloxacin, and Other   History: Past Medical History:  Diagnosis Date   Anxiety    Arthritis    Asthma    Cancer ([place]) 2020   lung cancer    GERD (gastroesophageal reflux disease)    Hiatal hernia    Hyperlipidemia    Osteopenia    Parkinson's syndrome ([place])    Raynaud's disease    Past Surgical History:  Procedure Laterality Date   BACK SURGERY  1995   Decompress disc lumbar   BREAST BIOPSY  1995   CATARACT EXTRACTION, BILATERAL Bilateral 2017   COLONOSCOPY  08/20/2015   repeat in 5 years   ESOPHAGOGASTRODUODENOSCOPY  2021   LUNG REMOVAL, PARTIAL Right    REFRACTIVE SURGERY Left 08/24/2016   Family History  Problem Relation Age of Onset   Dementia Mother  Heart disease Father    Diabetes Father    Prostate cancer Father    Diabetes Brother    Social History   Socioeconomic History   Marital status: Married    Spouse name: Deborah [name]   Number of children: 1   Years of education: Not on file   Highest education level: Not on file  Occupational History   Occupation: retired  Tobacco Use   Smoking status: Former    Pack years: 0.00    Types: Cigarettes    Quit date: 07/23/1960    Years since quitting: 60.1   Smokeless tobacco: Never   Vaping Use   Vaping Use: Never used  Substance and Sexual Activity   Alcohol use: Not Currently    Alcohol/week: 0.0 - 1.0 standard drinks    Comment: occ   Drug use: Never   Sexual activity: Yes    Partners: Male    Birth control/protection: None  Other Topics Concern   Not on file  Social History Narrative   Moved here from [place] with husband Deborah [name] beginning of 2022. Their child lives nearby.   Social Determinants of Health   Financial Resource Strain: Low Risk    Difficulty of Paying Living Expenses: Not hard at all  Food Insecurity: No Food Insecurity   Worried About Charity fundraiser in the Last Year: Never true   [place] in the Last Year: Never true  Transportation Needs: No Transportation Needs   Lack of Transportation (Medical): No   Lack of Transportation (Non-Medical): No  Physical Activity: Sufficiently Active   Days of Exercise per Week: 7 days   Minutes of Exercise per Session: 30 min  Stress: Stress Concern Present   Feeling of Stress : To some extent  Social Connections: Moderately Isolated   Frequency of Communication with Friends and Family: More than three times a week   Frequency of Social Gatherings with Friends and Family: Twice a week   Attends Religious Services: Never   Marine scientist or Organizations: No   Attends Music therapist: Never   Marital Status: Married    Tobacco Counseling Counseling given: Not Answered   Clinical Intake:  Pre-visit preparation completed: Yes  Pain : No/denies pain     BMI - recorded: 19.21 Nutritional Status: BMI of 19-24  Normal Nutritional Risks: None Diabetes: No  How often do you need to have someone help you when you read instructions, pamphlets, or other written materials from your doctor or pharmacy?: 1 - Never  Diabetic? No  Interpreter Needed?: No  Information entered by :: Alicja Everitt, LPN   Activities of Daily Living In your present state of health, do  you have any difficulty performing the following activities: 09/17/2020 08/14/2020  Hearing? Y N  Vision? N N  Difficulty concentrating or making decisions? N N  Walking or climbing stairs? N N  Dressing or bathing? N N  Doing errands, shopping? N N  Preparing Food and eating ? N -  Using the Toilet? N -  In the past six months, have you accidently leaked urine? N -  Do you have problems with loss of bowel control? N -  Managing your Medications? N -  Managing your Finances? N -  Housekeeping or managing your Housekeeping? N -    Patient Care Team: Loman Brooklyn, FNP as PCP - General (Family Medicine)  Indicate any recent Medical Services you may have received from other than [facility] providers  in the past year (date may be approximate).     Assessment:   This is a routine wellness examination for Lelani.  Hearing/Vision screen Hearing Screening - Comments:: C/o mild hearing difficulties - declines hearing aids Vision Screening - Comments:: Wears reading glasses prn - up to date with routine eye exam - last saw someone in [place] - plans to find local eye doctor and make appt soon  Dietary issues and exercise activities discussed: Current Exercise Habits: Home exercise routine, Type of exercise: walking;strength training/weights, Time (Minutes): 30, Frequency (Times/Week): 7, Weekly Exercise (Minutes/Week): 210, Intensity: Mild, Exercise limited by: neurologic condition(s);Other - see comments (Parkinson's)   Goals Addressed             This Visit's Progress    Exercise 3x per week (30 min per time)         Depression Screen PHQ 2/9 Scores 09/17/2020 09/04/2020 07/23/2020  PHQ - 2 Score 2 2 0  PHQ- 9 Score 9 9 0    Fall Risk Fall Risk  09/17/2020 09/04/2020 07/23/2020  Falls in the past year? 0 0 0  Number falls in past yr: 0 - -  Injury with Fall? 0 - -  Risk for fall due to : Other (Comment) - -  Risk for fall due to: Comment Parkinson's - -  Follow up Falls prevention  discussed;Education provided - -    FALL RISK PREVENTION PERTAINING TO THE HOME:  Any stairs in or around the home? No  If so, are there any without handrails? No  Home free of loose throw rugs in walkways, pet beds, electrical cords, etc? Yes  Adequate lighting in your home to reduce risk of falls? Yes   ASSISTIVE DEVICES UTILIZED TO PREVENT FALLS:  Life alert? No  Use of a cane, walker or w/c? No  Grab bars in the bathroom? No  Shower chair or bench in shower? No  Elevated toilet seat or a handicapped toilet? No   TIMED UP AND GO:  Was the test performed? No . Telephonic visit  Cognitive Function:     6CIT Screen 09/17/2020  What Year? 0 points  What month? 0 points  What time? 0 points  Count back from 20 0 points  Months in reverse 0 points  Repeat phrase 0 points  Total Score 0    Immunizations Immunization History  Administered Date(s) Administered   Influenza Split 10/23/2019   Moderna Sars-Covid-2 Vaccination 05/12/2019, 06/09/2019, 12/29/2019   Pneumococcal Conjugate-13 04/20/2013   Pneumococcal Polysaccharide-23 12/13/2013   Td 01/27/1999   Tdap 10/03/2012   Zoster, Live 12/28/2011    TDAP status: Up to date  Flu Vaccine status: Up to date  Pneumococcal vaccine status: Up to date  Covid-19 vaccine status: Completed vaccines  Qualifies for Shingles Vaccine? Yes   Zostavax completed Yes   Shingrix Completed?: No.    Education has been provided regarding the importance of this vaccine. Patient has been advised to call insurance company to determine out of pocket expense if they have not yet received this vaccine. Advised may also receive vaccine at local pharmacy or Health Dept. Verbalized acceptance and understanding.  Screening Tests Health Maintenance  Topic Date Due   COVID-19 Vaccine (4 - Booster for Moderna series) 04/30/2020   Zoster Vaccines- Shingrix (1 of 2) 12/05/2020 (Originally 01/03/1997)   INFLUENZA VACCINE  10/06/2020   MAMMOGRAM   02/24/2022   TETANUS/TDAP  10/04/2022   COLONOSCOPY (Pts 45-36yrs Insurance coverage will need to be confirmed)  03/08/2025   DEXA SCAN  Completed   Hepatitis C Screening  Completed   PNA vac Low Risk Adult  Completed   HPV VACCINES  Aged Out    Health Maintenance  Health Maintenance Due  Topic Date Due   COVID-19 Vaccine (4 - Booster for Moderna series) 04/30/2020    Colorectal cancer screening: Type of screening: Colonoscopy. Completed 2017. Repeat every 10 years  Mammogram status: Completed 02/25/2020. Repeat every year  Bone Density status: Completed 2021\. Results reflect: Bone density results: NORMAL. Repeat every 2-5\ years.  Lung Cancer Screening: (Low Dose CT Chest recommended if Age 54-80 years, 30 pack-year currently smoking OR have quit w/in 15years.) does not qualify.   Additional Screening:  Hepatitis C Screening: does qualify; Completed 08/14/20  Vision Screening: Recommended annual ophthalmology exams for early detection of glaucoma and other disorders of the eye. Is the patient up to date with their annual eye exam?  Yes  Who is the provider or what is the name of the office in which the patient attends annual eye exams? Someone in [place] last - will get new eye doctor locally soon If pt is not established with a provider, would they like to be referred to a provider to establish care? No .   Dental Screening: Recommended annual dental exams for proper oral hygiene  Community Resource Referral / Chronic Care Management: CRR required this visit?  No   CCM required this visit?  No      Plan:     I have personally reviewed and noted the following in the patient's chart:   Medical and social history Use of alcohol, tobacco or illicit drugs  Current medications and supplements including opioid prescriptions. Patient is not currently taking opioid prescriptions. Functional ability and status Nutritional status Physical activity Advanced directives List of  other physicians Hospitalizations, surgeries, and ER visits in previous 12 months Vitals Screenings to include cognitive, depression, and falls Referrals and appointments  In addition, I have reviewed and discussed with patient certain preventive protocols, quality metrics, and best practice recommendations. A written personalized care plan for preventive services as well as general preventive health recommendations were provided to patient.     Sandrea Hammond, LPN   [id number]   Nurse Notes: None

## 2020-09-17 NOTE — Patient Instructions (Signed)
Deborah [name] , Thank you for taking time to come for your Medicare Wellness Visit. I appreciate your ongoing commitment to your health goals. Please review the following plan we discussed and let me know if I can assist you in the future.   Screening recommendations/referrals: Colonoscopy: Done 2017 - Repeat in 10 years Mammogram: Done 02/25/2020 - Repeat annually  Bone Density: Done 2021 - Repeat every 2 years Recommended yearly ophthalmology/optometry visit for glaucoma screening and checkup Recommended yearly dental visit for hygiene and checkup  Vaccinations: Influenza vaccine: Done 10/17/2019 - Repeat annually  Pneumococcal vaccine: Done 04/20/2013 & 12/13/2013 Tdap vaccine: Done 10/03/2012 - Repeat in 10 years  Shingles vaccine: Zostavax done 12/28/2011; Due for Shingrix (not covered at this time)   Covid-19:Done 05/12/19, 06/09/19, & 12/29/19  Advanced directives: Advance directive discussed with you today. I have provided a copy for you to complete at home and have notarized. Once this is complete please bring a copy in to our office so we can scan it into your chart.   Conditions/risks identified: Aim for 30 minutes of exercise or brisk walking each day, drink 6-8 glasses of water and eat lots of fruits and vegetables.   Next appointment: Follow up in one year for your annual wellness visit    Preventive Care 65 Years and Older, Female Preventive care refers to lifestyle choices and visits with your health care provider that can promote health and wellness. What does preventive care include? A yearly physical exam. This is also called an annual well check. Dental exams once or twice a year. Routine eye exams. Ask your health care provider how often you should have your eyes checked. Personal lifestyle choices, including: Daily care of your teeth and gums. Regular physical activity. Eating a healthy diet. Avoiding tobacco and drug use. Limiting alcohol use. Practicing safe  sex. Taking low-dose aspirin every day. Taking vitamin and mineral supplements as recommended by your health care provider. What happens during an annual well check? The services and screenings done by your health care provider during your annual well check will depend on your age, overall health, lifestyle risk factors, and family history of disease. Counseling  Your health care provider may ask you questions about your: Alcohol use. Tobacco use. Drug use. Emotional well-being. Home and relationship well-being. Sexual activity. Eating habits. History of falls. Memory and ability to understand (cognition). Work and work Statistician. Reproductive health. Screening  You may have the following tests or measurements: Height, weight, and BMI. Blood pressure. Lipid and cholesterol levels. These may be checked every 5 years, or more frequently if you are over 29 years old. Skin check. Lung cancer screening. You may have this screening every year starting at age 28 if you have a 30-pack-year history of smoking and currently smoke or have quit within the past 15 years. Fecal occult blood test (FOBT) of the stool. You may have this test every year starting at age 93. Flexible sigmoidoscopy or colonoscopy. You may have a sigmoidoscopy every 5 years or a colonoscopy every 10 years starting at age 52. Hepatitis C blood test. Hepatitis B blood test. Sexually transmitted disease (STD) testing. Diabetes screening. This is done by checking your blood sugar (glucose) after you have not eaten for a while (fasting). You may have this done every 1-3 years. Bone density scan. This is done to screen for osteoporosis. You may have this done starting at age 21. Mammogram. This may be done every 1-2 years. Talk to your health care  provider about how often you should have regular mammograms. Talk with your health care provider about your test results, treatment options, and if necessary, the need for more  tests. Vaccines  Your health care provider may recommend certain vaccines, such as: Influenza vaccine. This is recommended every year. Tetanus, diphtheria, and acellular pertussis (Tdap, Td) vaccine. You may need a Td booster every 10 years. Zoster vaccine. You may need this after age 75. Pneumococcal 13-valent conjugate (PCV13) vaccine. One dose is recommended after age 78. Pneumococcal polysaccharide (PPSV23) vaccine. One dose is recommended after age 25. Talk to your health care provider about which screenings and vaccines you need and how often you need them. This information is not intended to replace advice given to you by your health care provider. Make sure you discuss any questions you have with your health care provider. Document Released: 03/21/2015 Document Revised: 11/12/2015 Document Reviewed: 12/24/2014 Elsevier Interactive Patient Education  2017 [employer] Prevention in the Home Falls can cause injuries. They can happen to people of all ages. There are many things you can do to make your home safe and to help prevent falls. What can I do on the outside of my home? Regularly fix the edges of walkways and driveways and fix any cracks. Remove anything that might make you trip as you walk through a door, such as a raised step or threshold. Trim any bushes or trees on the path to your home. Use bright outdoor lighting. Clear any walking paths of anything that might make someone trip, such as rocks or tools. Regularly check to see if handrails are loose or broken. Make sure that both sides of any steps have handrails. Any raised decks and porches should have guardrails on the edges. Have any leaves, snow, or ice cleared regularly. Use sand or salt on walking paths during winter. Clean up any spills in your garage right away. This includes oil or grease spills. What can I do in the bathroom? Use night lights. Install grab bars by the toilet and in the tub and shower.  Do not use towel bars as grab bars. Use non-skid mats or decals in the tub or shower. If you need to sit down in the shower, use a plastic, non-slip stool. Keep the floor dry. Clean up any water that spills on the floor as soon as it happens. Remove soap buildup in the tub or shower regularly. Attach bath mats securely with double-sided non-slip rug tape. Do not have throw rugs and other things on the floor that can make you trip. What can I do in the bedroom? Use night lights. Make sure that you have a light by your bed that is easy to reach. Do not use any sheets or blankets that are too big for your bed. They should not hang down onto the floor. Have a firm chair that has side arms. You can use this for support while you get dressed. Do not have throw rugs and other things on the floor that can make you trip. What can I do in the kitchen? Clean up any spills right away. Avoid walking on wet floors. Keep items that you use a lot in easy-to-reach places. If you need to reach something above you, use a strong step stool that has a grab bar. Keep electrical cords out of the way. Do not use floor polish or wax that makes floors slippery. If you must use wax, use non-skid floor wax. Do not have throw rugs  and other things on the floor that can make you trip. What can I do with my stairs? Do not leave any items on the stairs. Make sure that there are handrails on both sides of the stairs and use them. Fix handrails that are broken or loose. Make sure that handrails are as long as the stairways. Check any carpeting to make sure that it is firmly attached to the stairs. Fix any carpet that is loose or worn. Avoid having throw rugs at the top or bottom of the stairs. If you do have throw rugs, attach them to the floor with carpet tape. Make sure that you have a light switch at the top of the stairs and the bottom of the stairs. If you do not have them, ask someone to add them for you. What else  can I do to help prevent falls? Wear shoes that: Do not have high heels. Have rubber bottoms. Are comfortable and fit you well. Are closed at the toe. Do not wear sandals. If you use a stepladder: Make sure that it is fully opened. Do not climb a closed stepladder. Make sure that both sides of the stepladder are locked into place. Ask someone to hold it for you, if possible. Clearly mark and make sure that you can see: Any grab bars or handrails. First and last steps. Where the edge of each step is. Use tools that help you move around (mobility aids) if they are needed. These include: Canes. Walkers. Scooters. Crutches. Turn on the lights when you go into a dark area. Replace any light bulbs as soon as they burn out. Set up your furniture so you have a clear path. Avoid moving your furniture around. If any of your floors are uneven, fix them. If there are any pets around you, be aware of where they are. Review your medicines with your doctor. Some medicines can make you feel dizzy. This can increase your chance of falling. Ask your doctor what other things that you can do to help prevent falls. This information is not intended to replace advice given to you by your health care provider. Make sure you discuss any questions you have with your health care provider. Document Released: 12/19/2008 Document Revised: 07/31/2015 Document Reviewed: 03/29/2014 Elsevier Interactive Patient Education  2017 [employer].

## 2020-09-20 ENCOUNTER — Encounter: Payer: Self-pay | Admitting: Family Medicine

## 2020-09-27 ENCOUNTER — Other Ambulatory Visit: Payer: Self-pay | Admitting: Family Medicine

## 2020-09-27 DIAGNOSIS — F411 Generalized anxiety disorder: Secondary | ICD-10-CM

## 2020-10-17 ENCOUNTER — Encounter: Payer: Self-pay | Admitting: Family Medicine

## 2020-10-17 ENCOUNTER — Other Ambulatory Visit: Payer: Self-pay | Admitting: Family Medicine

## 2020-10-17 DIAGNOSIS — F411 Generalized anxiety disorder: Secondary | ICD-10-CM

## 2020-10-17 DIAGNOSIS — G20A1 Parkinson's disease without dyskinesia, without mention of fluctuations: Secondary | ICD-10-CM

## 2020-10-17 DIAGNOSIS — G20 Parkinson's disease: Secondary | ICD-10-CM

## 2020-10-17 DIAGNOSIS — R000 Tachycardia, unspecified: Secondary | ICD-10-CM

## 2020-10-17 DIAGNOSIS — K219 Gastro-esophageal reflux disease without esophagitis: Secondary | ICD-10-CM

## 2020-10-17 MED ORDER — ESCITALOPRAM OXALATE 20 MG PO TABS: 20.0000 mg | ORAL_TABLET | Freq: Every day | ORAL | 0 refills | Status: DC

## 2020-10-17 MED ORDER — OMEPRAZOLE 20 MG PO CPDR: 20.0000 mg | DELAYED_RELEASE_CAPSULE | Freq: Every day | ORAL | 0 refills | Status: DC

## 2020-10-17 MED ORDER — CARBIDOPA-LEVODOPA 25-100 MG PO TABS: 2.0000 | ORAL_TABLET | Freq: Three times a day (TID) | ORAL | 2 refills | Status: DC

## 2020-10-17 MED ORDER — ATENOLOL 25 MG PO TABS: 25.0000 mg | ORAL_TABLET | Freq: Every day | ORAL | 0 refills | Status: DC

## 2020-10-27 ENCOUNTER — Telehealth: Payer: Self-pay | Admitting: Neurology

## 2020-10-27 ENCOUNTER — Ambulatory Visit: Payer: Medicare Other | Admitting: Neurology

## 2020-10-27 NOTE — Telephone Encounter (Signed)
NP appointment cancelled due to MD out, unable to reach pt by phone, mychart msg sent.

## 2020-10-27 NOTE — Telephone Encounter (Signed)
Pt called, would like to discuss with the nurse a sooner appt than 01/14/21.

## 2020-10-27 NOTE — Telephone Encounter (Signed)
I see pt is scheduled with Dr Rexene Alberts for 10/28/20.

## 2020-10-28 ENCOUNTER — Ambulatory Visit (HOSPITAL_COMMUNITY): Payer: Medicare Other | Admitting: Hematology

## 2020-10-28 ENCOUNTER — Encounter: Payer: Self-pay | Admitting: Neurology

## 2020-10-28 ENCOUNTER — Ambulatory Visit (INDEPENDENT_AMBULATORY_CARE_PROVIDER_SITE_OTHER): Payer: Medicare Other | Admitting: Neurology

## 2020-10-28 VITALS — BP 102/61 | HR 57 | Ht 66.0 in | Wt 119.8 lb

## 2020-10-28 DIAGNOSIS — G20 Parkinson's disease: Secondary | ICD-10-CM | POA: Diagnosis not present

## 2020-10-28 DIAGNOSIS — G20C Parkinsonism, unspecified: Secondary | ICD-10-CM

## 2020-10-28 NOTE — Patient Instructions (Signed)
It was nice to meet you both today.  As explained, I do not see any telltale signs of Parkinson's disease, however, you are also on medication at this moment.  Sometimes taking medication can mask the Parkinson's findings.  Please try to continue to pursue healthy lifestyle. Remember to drink plenty of fluid at least 6 glasses (8 oz each), eat healthy meals and do not skip any meals. Try to eat protein with a every meal and eat a healthy snack such as fruit or nuts in between meals. Try to keep a regular sleep-wake schedule and try to exercise daily, particularly in the form of walking, 20-30 minutes a day, if you can.  Try to stay active physically and mentally. Engage in social activities in your community and with your family and try to keep up with current events by reading the newspaper or watching the news. Try to do word puzzles and you may like to do puzzles and brain games on the computer such as on [email or web address]   For now, continue your Sinemet/carbidopa-levodopa at the current dose.    As explained, I would like to proceed with a so-called DaT scan: This is a specialized brain scan designed to help with diagnosis of tremor disorders. A radioactive marker gets injected and the uptake is measured in the brain and compared to normal controls and right side is compared to the left, a change in uptake can help with diagnosis of certain tremor disorders. A brain MRI on the other hand is a brain scan that helps look at the brain structure in more detail overall and look for age-related changes, blood vessel related changes and look for stroke and volume loss which we call atrophy.   We will call you with the results and plan a follow-up afterwards.

## 2020-10-28 NOTE — Progress Notes (Signed)
Subjective:    Patient ID: Deborah [name] is a 75 y.o. female.  HPI    Star Age, MD, PhD [facility] [address] [place], [place] [address]  Dear Karsten Fells,   I saw your patient, Deborah [name], upon your kind request in my neurologic clinic today for initial consultation of her parkinsonism.  The patient is accompanied by her husband, Deborah [name], today.  As you know, Deborah [name] is a 75 year old right-handed woman with an underlying medical history of seasonal allergies, reflux disease, anxiety, tachycardia, history of lung cancer, arthritis, asthma, hiatal hernia, osteopenia, and Raynaud's disease, who was previously diagnosed with Parkinson's disease.  She was followed at the [facility] clinic.  She had previously seen a neurologist by the name of Dr. Linus Mako.  I was able to review a note from 04/16/2020.  At that time she was on Sinemet 25-100 mg strength 2 tablets 3 times daily.  She was in the process of tapering clonazepam and she was on Lexapro 20 mg daily.  She had originally seen the neurologist on 11/14/2019.  She reported shakiness since 2019.  She reports that her original tremor started in the left hand and was noticeable primarily when she was more anxious.  Anxiety has since then been better.  She has been on Lexapro.  She has been able to taper off clonazepam.  She is currently not utilizing BuSpar on a regular basis. She has no family history of Parkinson's disease, she denies a family history of tremors, mom lived to be 73 years old, dad lived to be 10 years old.  She has 1 older brother and 2 younger brothers, neither with tremors.  She does have a history of degenerative lumbar disc disease and had surgery in the past.  She feels that she has benefited from the levodopa and currently takes 2 pills 3 times daily, at 6 AM, 1130 or noon for the second dose and last dose at 6:15 PM.  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  They moved to [place] in March 2022 to be closer to their daughter.  Patient and daughter both live in [place].  She is a non-smoker.  She had lung cancer surgery in March 2020 with right lower lobectomy, no subsequent chemotherapy or radiation.   She was hospitalized in June 2022 with dehydration, complicated by metabolic encephalopathy. She reports no recent falls, no issues with constipation.  She tries to stay active and uses a treadmill on a regular basis.  Her Past Medical History Is Significant For: Past Medical History:  Diagnosis Date   Anxiety    Arthritis    Asthma    Cancer ([place]) 2020   lung cancer    GERD (gastroesophageal reflux disease)    Hiatal hernia    Hyperlipidemia    Osteopenia    Parkinson's disease ([place])    Parkinson's syndrome ([place])    Raynaud's disease     Her Past Surgical History Is Significant For: Past Surgical History:  Procedure Laterality Date   BACK SURGERY  1995   Decompress disc lumbar   BREAST BIOPSY  1995   CATARACT EXTRACTION, BILATERAL Bilateral 2017   COLONOSCOPY  08/20/2015   repeat in 5 years   ESOPHAGOGASTRODUODENOSCOPY  2021   LUNG REMOVAL, PARTIAL Right    REFRACTIVE SURGERY Left 08/24/2016    Her Family History Is Significant For: Family History  Problem Relation Age of Onset  Dementia Mother    Heart disease Father    Diabetes Father    Prostate cancer Father    Diabetes Brother    Parkinson's disease Neg Hx     Her Social History Is Significant For: Social History   Socioeconomic History   Marital status: Married    Spouse name: Deborah [name]   Number of children: 1   Years of education: Not on file   Highest education level: Not on file  Occupational History   Occupation: retired  Tobacco Use   Smoking status: Former    Types: Cigarettes    Quit date: 07/23/1960    Years since quitting: 60.3   Smokeless tobacco: Never  Vaping Use   Vaping Use: Never used  Substance and Sexual Activity    Alcohol use: Not Currently    Alcohol/week: 0.0 - 1.0 standard drinks    Comment: occ   Drug use: Never   Sexual activity: Yes    Partners: Male    Birth control/protection: None  Other Topics Concern   Not on file  Social History Narrative   Moved here from [place] with husband Deborah [name] beginning of 2022. Their child lives nearby.   Social Determinants of Health   Financial Resource Strain: Low Risk    Difficulty of Paying Living Expenses: Not hard at all  Food Insecurity: No Food Insecurity   Worried About Charity fundraiser in the Last Year: Never true   [place] in the Last Year: Never true  Transportation Needs: No Transportation Needs   Lack of Transportation (Medical): No   Lack of Transportation (Non-Medical): No  Physical Activity: Sufficiently Active   Days of Exercise per Week: 7 days   Minutes of Exercise per Session: 30 min  Stress: Stress Concern Present   Feeling of Stress : To some extent  Social Connections: Moderately Isolated   Frequency of Communication with Friends and Family: More than three times a week   Frequency of Social Gatherings with Friends and Family: Twice a week   Attends Religious Services: Never   Marine scientist or Organizations: No   Attends Music therapist: Never   Marital Status: Married    Her Allergies Are:  Allergies  Allergen Reactions   Sulfa Antibiotics Hives and Itching   Amoxicillin-Pot Clavulanate Nausea And Vomiting   Cefdinir Nausea And Vomiting   Ciprofloxacin Hives and Itching   Other     Bioxin   :   Her Current Medications Are:  Outpatient Encounter Medications as of 10/28/2020  Medication Sig   atenolol (TENORMIN) 25 MG tablet Take 1 tablet (25 mg total) by mouth daily.   busPIRone (BUSPAR) 7.5 MG tablet TAKE 1 TABLET BY MOUTH 2 TIMES DAILY AS NEEDED.   carbidopa-levodopa (SINEMET IR) 25-100 MG tablet Take 2 tablets by mouth in the morning, at noon, and at bedtime.   escitalopram  (LEXAPRO) 20 MG tablet Take 1 tablet (20 mg total) by mouth daily at 2 PM.   omeprazole (PRILOSEC) 20 MG capsule Take 1 capsule (20 mg total) by mouth daily at 2 PM.   triamcinolone (NASACORT) 55 MCG/ACT AERO nasal inhaler Place 2 sprays into the nose daily.   triamcinolone cream (KENALOG) 0.1 % Apply 1 application topically 2 (two) times daily.   [DISCONTINUED] feeding supplement (ENSURE ENLIVE / ENSURE PLUS) LIQD Take 237 mLs by mouth 3 (three) times daily between meals.   [DISCONTINUED] fluticasone (FLONASE) 50 MCG/ACT nasal spray Place 1 spray  into both nostrils daily.   [DISCONTINUED] potassium chloride SA (KLOR-CON) 20 MEQ tablet Take 1 tablet (20 mEq total) by mouth daily for 3 days.   No facility-administered encounter medications on file as of 10/28/2020.  :   Review of Systems:  Out of a complete 14 point review of systems, all are reviewed and negative with the exception of these symptoms as listed below:   Review of Systems  Neurological:        Pt Is here today because she has moved to [facility] and she needs a neurologist to help her Parkinson's. Pt states that she has no complaints .     Objective:  Neurological Exam  Physical Exam Physical Examination:   Vitals:   10/28/20 1238  BP: 102/61  Pulse: (!) 57    General Examination: The patient is a very pleasant 75 y.o. female in no acute distress. She appears well-developed and well-nourished and well groomed.   HEENT: Normocephalic, atraumatic, pupils are equal, round and reactive to light and accommodation. Funduscopic exam is normal with sharp disc margins noted.  Status post bilateral cataract repairs, extraocular tracking without limitation, no nystagmus, no significant facial masking noted.  Neck is supple, no nuchal rigidity noted.  No carotid bruits.  Airway examination reveals mild mouth dryness.  No obvious hypophonia or dysarthria.  No voice tremor.  Slight intermittent side-to-side head tremor noted.  Tongue  protrudes centrally and palate elevates symmetrically.   Chest: Clear to auscultation without wheezing, rhonchi or crackles noted.  Heart: S1+S2+0, regular and normal without murmurs, rubs or gallops noted.   Abdomen: Soft, non-tender and non-distended with normal bowel sounds appreciated on auscultation.  Extremities: There is no pitting edema in the distal lower extremities bilaterally. Pedal pulses are intact.  Skin: Warm and dry without trophic changes noted.  Musculoskeletal: exam reveals no obvious joint deformities.   Neurologically:  Mental status: The patient is awake, alert and oriented in all 4 spheres. Her immediate and remote memory, attention, language skills and fund of knowledge are appropriate. There is no evidence of aphasia, agnosia, apraxia or anomia. Speech is clear with normal prosody and enunciation. Thought process is linear. Mood is normal and affect is normal.  Cranial nerves II - XII are as described above under HEENT exam. In addition: shoulder shrug is normal with equal shoulder height noted. Motor exam: Normal bulk, strength and tone is noted. There is no drift, resting tremor or rebound.  No significant action or postural tremor with the exception of a very subtle left hand postural tremor noted.  Reflexes are 2+ throughout. Babinski: Toes are flexor bilaterally. Fine motor skills and coordination: intact with normal finger taps, normal hand movements, normal rapid alternating patting, normal foot taps and normal foot agility.  Cerebellar testing: No dysmetria or intention tremor on finger to nose testing. Heel to shin is unremarkable bilaterally. There is no truncal or gait ataxia.  Sensory exam: intact to light touch in the upper and lower extremities.  Gait, station and balance: She stands easily. No veering to one side is noted. No leaning to one side is noted. Posture is age-appropriate and stance is narrow based. Gait shows normal stride length and normal pace.   Fairly preserved arm swing, no shuffling, no freezing.   Assessment and Plan:  Assessment and Plan:  In summary, Deborah [name] is a very pleasant 75 y.o.-year old female with an underlying medical history of seasonal allergies, reflux disease, anxiety, tachycardia, history of lung cancer,  arthritis, asthma, hiatal hernia, osteopenia, and Raynaud's disease, who presents for evaluation of her parkinsonism.  She reports that she started having a left upper extremity tremor and work around 2019.  She was diagnosed with tremor predominant Parkinson's disease in September 2021 when she saw neurologist at the [facility] clinic.  She has been on levodopa therapy since then and has benefited from treatment.  She is advised that currently I do not see any telltale signs of parkinsonism.  Some of her symptoms are certainly very possibly masked by taking medication.  I would like to suggest further evaluation with a scan called DaTscan for additional diagnostic help.  She has been able to tolerate levodopa therapy and for now can certainly continue with her current prescription.  I explained the DaTscan to the patient and her husband in detail and she was willing to proceed.  We obtained consent for the test today and we will schedule her at her convenience in [place].  We will call her with results and plan a follow-up afterwards.  We talked about the importance of maintaining a healthy lifestyle.  She is advised to continue to try to eat nutritious food, monitor for constipation, continue to hydrate well with water, we talked about the importance of stress management and getting enough rest.  She is physically active.  She feels that her anxiety has improved and she is stable on Lexapro, has not used BuSpar on a regular basis and has been able to wean off clonazepam. I answered all their questions today and the patient and her husband were in agreement with the above plan.   Thank you very much for allowing me to  participate in the care of this nice patient. If I can be of any further assistance to you please do not hesitate to call me at [phone].  Sincerely,   Star Age, MD, PhD

## 2020-10-29 ENCOUNTER — Telehealth: Payer: Self-pay | Admitting: Neurology

## 2020-10-29 NOTE — Telephone Encounter (Signed)
Received signed patient consent form for DaTscan referral. Filled out all necessary ppw, received MD signature & faxed to [facility]. They will obtain PA if needed and provide me with the outcome. Phone: [phone].

## 2020-10-30 NOTE — Telephone Encounter (Signed)
NPR required for DaTscan. Case [id number]. Sent to [employer] @ [facility] to schedule.

## 2020-11-04 ENCOUNTER — Other Ambulatory Visit: Payer: Self-pay

## 2020-11-04 ENCOUNTER — Ambulatory Visit (INDEPENDENT_AMBULATORY_CARE_PROVIDER_SITE_OTHER): Payer: Medicare Other | Admitting: Family Medicine

## 2020-11-04 ENCOUNTER — Encounter: Payer: Self-pay | Admitting: Family Medicine

## 2020-11-04 VITALS — BP 113/69 | HR 67 | Temp 97.8°F | Ht 66.0 in | Wt 120.2 lb

## 2020-11-04 DIAGNOSIS — L821 Other seborrheic keratosis: Secondary | ICD-10-CM

## 2020-11-04 DIAGNOSIS — M8580 Other specified disorders of bone density and structure, unspecified site: Secondary | ICD-10-CM

## 2020-11-04 DIAGNOSIS — Z0000 Encounter for general adult medical examination without abnormal findings: Secondary | ICD-10-CM

## 2020-11-04 DIAGNOSIS — K219 Gastro-esophageal reflux disease without esophagitis: Secondary | ICD-10-CM | POA: Diagnosis not present

## 2020-11-04 DIAGNOSIS — L659 Nonscarring hair loss, unspecified: Secondary | ICD-10-CM

## 2020-11-04 DIAGNOSIS — I700 Atherosclerosis of aorta: Secondary | ICD-10-CM

## 2020-11-04 DIAGNOSIS — J302 Other seasonal allergic rhinitis: Secondary | ICD-10-CM | POA: Diagnosis not present

## 2020-11-04 DIAGNOSIS — G20 Parkinson's disease: Secondary | ICD-10-CM | POA: Diagnosis not present

## 2020-11-04 DIAGNOSIS — F411 Generalized anxiety disorder: Secondary | ICD-10-CM

## 2020-11-04 NOTE — Patient Instructions (Signed)
You may stop omeprazole and take famotidine (Pepcid) as needed for acid reflux.  You have osteopenia (low bone mass), which can progress to osteoporosis.  Please take over the counter Calcium 1,200 mg and Vitamin  D3 800 IU once daily to protect your bones. Doing weightbearing exercise like walking, jogging, dancing, etc. is good for you bones. Please have the DEXA rechecked in 2 years.

## 2020-11-04 NOTE — Progress Notes (Signed)
Assessment & Plan:  1. Generalized anxiety disorder Well controlled on current regimen.  - CBC with Differential/Platelet - CMP14+EGFR  2. Parkinson disease ([place]) Continue management by neurologist. - CBC with Differential/Platelet - CMP14+EGFR  3. Gastroesophageal reflux disease without esophagitis Well controlled on current regimen, but patient would like to get off omeprazole. She is going to stop it and use famotidine as needed. - CBC with Differential/Platelet - CMP14+EGFR  4. Seasonal allergies Well controlled on current regimen.   5. Osteopenia, unspecified location Continue vitamin D. Encouraged her to start a calcium supplement.  6. Hair loss Patient to resume Biotin and continue drinking plenty of water for her urine. - TSH  7. Aortic atherosclerosis ([facility]) Agreeable to start a statin "later". Education provided on atherosclerosis. - Lipid panel  8. Seborrheic keratoses Education provided on seborrheic keratoses.  9. Healthcare maintenance Record request sent via Epic to [facility] at [phone] for colonoscopy report. Patient to schedule mammogram for December.    Return in about 6 months (around 05/05/2021) for follow-up of chronic medication conditions.  Hendricks Limes, MSN, APRN, FNP-C [facility]  Subjective:    Patient ID: Deborah [name], female    DOB: Feb 04, 1946, 75 y.o.   MRN: [id number]  Patient Care Team: Loman Brooklyn, FNP as PCP - General (Family Medicine)   Chief Complaint:  Chief Complaint  Patient presents with   Anxiety   Gastroesophageal Reflux    2 month follow up of chronic medical conditions     HPI: Deborah [name] is a 75 y.o. female presenting on 11/04/2020 for Anxiety and Gastroesophageal Reflux (2 month follow up of chronic medical conditions )  Anxiety: patient was given a prescription of BuSpar to use as needed for anxiety. She reports she has not needed it yet as she is doing  well.  Parkinsonism Syndrome: patient is seeing neurology who does not see any telltale signs of parkinsonism. She is still taking Sinemet which may be masking symptoms. Patient is going to proceed with a DaTscan if insurance covers it.   GERD: patient has been taking omeprazole for about a year now. She would like to go ahead and get off of it as she does not wish to be on the medication long term.   Allergies: patient has seen ENT who switched her nasal spray from Flonase to Nasacort. Patient is pleased with this switch.  Osteopenia: taking vitamin D 1,000 units once daily. She is not taking a calcium supplement.   New complaints: Patient reports her urine was bright yellow, so she quit taking her Biotin and started drinking more water. This resolved the yellow color of her urine. She is now experiencing hair loss.   Social history:  Relevant past medical, surgical, family and social history reviewed and updated as indicated. Interim medical history since our last visit reviewed.  Allergies and medications reviewed and updated.  DATA REVIEWED: CHART IN EPIC  ROS: Negative unless specifically indicated above in HPI.    Current Outpatient Medications:    atenolol (TENORMIN) 25 MG tablet, Take 1 tablet (25 mg total) by mouth daily., Disp: 90 tablet, Rfl: 0   busPIRone (BUSPAR) 7.5 MG tablet, TAKE 1 TABLET BY MOUTH 2 TIMES DAILY AS NEEDED., Disp: 180 tablet, Rfl: 0   carbidopa-levodopa (SINEMET IR) 25-100 MG tablet, Take 2 tablets by mouth in the morning, at noon, and at bedtime., Disp: 180 tablet, Rfl: 2   escitalopram (LEXAPRO) 20 MG tablet, Take 1 tablet (20  mg total) by mouth daily at 2 PM., Disp: 90 tablet, Rfl: 0   omeprazole (PRILOSEC) 20 MG capsule, Take 1 capsule (20 mg total) by mouth daily at 2 PM., Disp: 90 capsule, Rfl: 0   triamcinolone (NASACORT) 55 MCG/ACT AERO nasal inhaler, Place 2 sprays into the nose daily., Disp: , Rfl:    Allergies  Allergen Reactions   Sulfa  Antibiotics Hives and Itching   Amoxicillin-Pot Clavulanate Nausea And Vomiting   Cefdinir Nausea And Vomiting   Ciprofloxacin Hives and Itching   Other     Bioxin    Past Medical History:  Diagnosis Date   Anxiety    Arthritis    Asthma    Cancer ([place]) 2020   lung cancer    GERD (gastroesophageal reflux disease)    Hiatal hernia    Hyperlipidemia    Osteopenia    Parkinson's disease ([place])    Parkinson's syndrome ([place])    Raynaud's disease     Past Surgical History:  Procedure Laterality Date   BACK SURGERY  1995   Decompress disc lumbar   BREAST BIOPSY  1995   CATARACT EXTRACTION, BILATERAL Bilateral 2017   COLONOSCOPY  08/20/2015   repeat in 5 years   ESOPHAGOGASTRODUODENOSCOPY  2021   LUNG REMOVAL, PARTIAL Right    REFRACTIVE SURGERY Left 08/24/2016    Social History   Socioeconomic History   Marital status: Married    Spouse name: [name]   Number of children: 1   Years of education: Not on file   Highest education level: Not on file  Occupational History   Occupation: retired  Tobacco Use   Smoking status: Former    Types: Cigarettes    Quit date: 07/23/1960    Years since quitting: 60.3   Smokeless tobacco: Never  Vaping Use   Vaping Use: Never used  Substance and Sexual Activity   Alcohol use: Not Currently    Alcohol/week: 0.0 - 1.0 standard drinks    Comment: occ   Drug use: Never   Sexual activity: Yes    Partners: Male    Birth control/protection: None  Other Topics Concern   Not on file  Social History Narrative   Moved here from [place] with husband [name] beginning of 2022. Their child lives nearby.   Social Determinants of Health   Financial Resource Strain: Low Risk    Difficulty of Paying Living Expenses: Not hard at all  Food Insecurity: No Food Insecurity   Worried About Charity fundraiser in the Last Year: Never true   [place] in the Last Year: Never true  Transportation Needs: No Transportation Needs   Lack of  Transportation (Medical): No   Lack of Transportation (Non-Medical): No  Physical Activity: Sufficiently Active   Days of Exercise per Week: 7 days   Minutes of Exercise per Session: 30 min  Stress: Stress Concern Present   Feeling of Stress : To some extent  Social Connections: Moderately Isolated   Frequency of Communication with Friends and Family: More than three times a week   Frequency of Social Gatherings with Friends and Family: Twice a week   Attends Religious Services: Never   Marine scientist or Organizations: No   Attends Music therapist: Never   Marital Status: Married  Human resources officer Violence: Not At Risk   Fear of Current or Ex-Partner: No   Emotionally Abused: No   Physically Abused: No   Sexually Abused: No  Objective:    BP 113/69   Pulse 67   Temp 97.8 F (36.6 C) (Temporal)   Ht '5\' 6"'  (1.676 m)   Wt 120 lb 3.2 oz (54.5 kg)   SpO2 100%   BMI 19.40 kg/m   Wt Readings from Last 3 Encounters:  11/04/20 120 lb 3.2 oz (54.5 kg)  10/28/20 119 lb 12.8 oz (54.3 kg)  09/17/20 119 lb (54 kg)    Physical Exam Vitals reviewed.  Constitutional:      General: She is not in acute distress.    Appearance: Normal appearance. She is underweight. She is not ill-appearing, toxic-appearing or diaphoretic.  HENT:     Head: Normocephalic and atraumatic.  Eyes:     General: No scleral icterus.       Right eye: No discharge.        Left eye: No discharge.     Conjunctiva/sclera: Conjunctivae normal.  Cardiovascular:     Rate and Rhythm: Normal rate and regular rhythm.     Heart sounds: Normal heart sounds. No murmur heard.   No friction rub. No gallop.  Pulmonary:     Effort: Pulmonary effort is normal. No respiratory distress.     Breath sounds: Normal breath sounds. No stridor. No wheezing, rhonchi or rales.  Musculoskeletal:        General: Normal range of motion.     Cervical back: Normal range of motion.  Skin:    General: Skin  is warm and dry.     Capillary Refill: Capillary refill takes less than 2 seconds.     Comments: Seborrheic keratosis to chest.  Neurological:     General: No focal deficit present.     Mental Status: She is alert and oriented to person, place, and time. Mental status is at baseline.  Psychiatric:        Mood and Affect: Mood normal.        Behavior: Behavior normal.        Thought Content: Thought content normal.        Judgment: Judgment normal.    No results found for: TSH Lab Results  Component Value Date   WBC 6.4 09/12/2020   HGB 11.1 09/12/2020   HCT 34.5 09/12/2020   MCV 92 09/12/2020   PLT 492 (H) 09/12/2020   Lab Results  Component Value Date   NA 140 09/12/2020   K 4.4 09/12/2020   CO2 21 09/12/2020   GLUCOSE 149 (H) 09/12/2020   BUN 6 (L) 09/12/2020   CREATININE 0.64 09/12/2020   BILITOT 0.4 09/12/2020   ALKPHOS 112 09/12/2020   AST 41 (H) 09/12/2020   ALT 15 09/12/2020   PROT 7.3 09/12/2020   ALBUMIN 3.7 09/12/2020   CALCIUM 9.4 09/12/2020   ANIONGAP 4 (L) 08/24/2020   EGFR 93 09/12/2020   No results found for: CHOL No results found for: HDL No results found for: LDLCALC No results found for: TRIG No results found for: CHOLHDL No results found for: HGBA1C

## 2020-11-05 ENCOUNTER — Ambulatory Visit (HOSPITAL_COMMUNITY): Payer: Medicare Other | Admitting: Hematology

## 2020-11-05 LAB — LIPID PANEL
Chol/HDL Ratio: 3.4 ratio (ref 0.0–4.4)
Cholesterol, Total: 163 mg/dL (ref 100–199)
HDL: 48 mg/dL (ref >39)
LDL Chol Calc (NIH): 91 mg/dL (ref 0–99)
Triglycerides: 133 mg/dL (ref 0–149)
VLDL Cholesterol Cal: 24 mg/dL (ref 5–40)

## 2020-11-05 LAB — CBC WITH DIFFERENTIAL/PLATELET
Basophils Absolute: 0.1 10*3/uL (ref 0.0–0.2)
Basos: 1 %
EOS (ABSOLUTE): 0.1 10*3/uL (ref 0.0–0.4)
Eos: 1 %
Hematocrit: 37 % (ref 34.0–46.6)
Hemoglobin: 12.4 g/dL (ref 11.1–15.9)
Immature Grans (Abs): 0 10*3/uL (ref 0.0–0.1)
Immature Granulocytes: 0 %
Lymphocytes Absolute: 2 10*3/uL (ref 0.7–3.1)
Lymphs: 23 %
MCH: 30.8 pg (ref 26.6–33.0)
MCHC: 33.5 g/dL (ref 31.5–35.7)
MCV: 92 fL (ref 79–97)
Monocytes Absolute: 0.7 10*3/uL (ref 0.1–0.9)
Monocytes: 9 %
Neutrophils Absolute: 5.6 10*3/uL (ref 1.4–7.0)
Neutrophils: 66 %
Platelets: 296 10*3/uL (ref 150–450)
RBC: 4.02 x10E6/uL (ref 3.77–5.28)
RDW: 12.3 % (ref 11.7–15.4)
WBC: 8.5 10*3/uL (ref 3.4–10.8)

## 2020-11-05 LAB — CMP14+EGFR
ALT: 10 IU/L (ref 0–32)
AST: 31 IU/L (ref 0–40)
Albumin/Globulin Ratio: 1.7 (ref 1.2–2.2)
Albumin: 4.5 g/dL (ref 3.7–4.7)
Alkaline Phosphatase: 65 IU/L (ref 44–121)
BUN/Creatinine Ratio: 29 — ABNORMAL HIGH (ref 12–28)
BUN: 24 mg/dL (ref 8–27)
Bilirubin Total: 0.4 mg/dL (ref 0.0–1.2)
CO2: 21 mmol/L (ref 20–29)
Calcium: 9.8 mg/dL (ref 8.7–10.3)
Chloride: 98 mmol/L (ref 96–106)
Creatinine, Ser: 0.82 mg/dL (ref 0.57–1.00)
Globulin, Total: 2.7 g/dL (ref 1.5–4.5)
Glucose: 93 mg/dL (ref 65–99)
Potassium: 4.3 mmol/L (ref 3.5–5.2)
Sodium: 135 mmol/L (ref 134–144)
Total Protein: 7.2 g/dL (ref 6.0–8.5)
eGFR: 75 mL/min/{1.73_m2} (ref >59)

## 2020-11-05 LAB — TSH: TSH: 1.58 u[IU]/mL (ref 0.450–4.500)

## 2020-11-09 DIAGNOSIS — C3491 Malignant neoplasm of unspecified part of right bronchus or lung: Secondary | ICD-10-CM | POA: Insufficient documentation

## 2020-11-09 NOTE — Progress Notes (Signed)
[facility] [address] [place] [address]   CLINIC:  Medical Oncology/Hematology  CONSULT NOTE  Patient Care Team: Loman Brooklyn, FNP as PCP - General (Family Medicine) Brien Mates, RN as Oncology Nurse Navigator (Oncology) Derek Jack, MD as Medical Oncologist (Oncology)  CHIEF COMPLAINTS/PURPOSE OF CONSULTATION:  Evaluation of history of lung cancer  HISTORY OF PRESENTING ILLNESS:  Ms. Deborah Panchal. [name] 75 y.o. female is here because of evaluation of lung cancer, at the request of [facility].  Today she reports feeling good. She presented to the ED for severe weakness on 08/14/2020; she was admitted and hospitalized until 08/25/2020. She has returned to her baseline energy levels since being discharged, and she denies unintentional weight loss, SOB, CP, and cough. 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 She smoked 3-4 cigarettes a day for 3 years. Her father had prostate cancer, and her brother had AML.   MEDICAL HISTORY:  Past Medical History:  Diagnosis Date   Anxiety    Arthritis    Asthma    GERD (gastroesophageal reflux disease)    Hiatal hernia    Hyperlipidemia    Lung cancer ([place])    Osteopenia    Parkinson's syndrome ([place])    Raynaud's disease     SURGICAL HISTORY: Past Surgical History:  Procedure Laterality Date   BACK SURGERY  1995   Decompress disc lumbar   BREAST BIOPSY  1995   CATARACT EXTRACTION, BILATERAL Bilateral 2017   COLONOSCOPY  08/20/2015   repeat in 5 years   ESOPHAGOGASTRODUODENOSCOPY  2021   LUNG REMOVAL, PARTIAL Right    REFRACTIVE SURGERY Left 08/24/2016    SOCIAL HISTORY: Social History   Socioeconomic History   Marital status: Married    Spouse name: [name]   Number of children: 1   Years of education: Not on file   Highest education level: Not on file  Occupational History   Occupation: retired  Tobacco Use   Smoking status: Former    Packs/day: 0.25    Years: 3.00     Pack years: 0.75    Types: Cigarettes    Quit date: 07/23/1960    Years since quitting: 60.3   Smokeless tobacco: Never  Vaping Use   Vaping Use: Never used  Substance and Sexual Activity   Alcohol use: Not Currently    Alcohol/week: 0.0 - 1.0 standard drinks    Comment: occ   Drug use: Never   Sexual activity: Yes    Partners: Male    Birth control/protection: None  Other Topics Concern   Not on file  Social History Narrative   Moved here from [place] with husband [name] beginning of 2022. Their child lives nearby.   Social Determinants of Health   Financial Resource Strain: Low Risk    Difficulty of Paying Living Expenses: Not hard at all  Food Insecurity: No Food Insecurity   Worried About Charity fundraiser in the Last Year: Never true   [place] in the Last Year: Never true  Transportation Needs: No Transportation Needs   Lack of Transportation (Medical): No   Lack of Transportation (Non-Medical): No  Physical Activity: Sufficiently Active   Days of Exercise per Week: 7 days   Minutes of Exercise per Session: 30 min  Stress: Stress Concern Present   Feeling of Stress : To some extent  Social Connections: Moderately Isolated   Frequency of Communication with Friends and Family: More than three times  a week   Frequency of Social Gatherings with Friends and Family: Twice a week   Attends Religious Services: Never   Marine scientist or Organizations: No   Attends Music therapist: Never   Marital Status: Married  Human resources officer Violence: Not At Risk   Fear of Current or Ex-Partner: No   Emotionally Abused: No   Physically Abused: No   Sexually Abused: No    FAMILY HISTORY: Family History  Problem Relation Age of Onset   Dementia Mother    Heart disease Father    Diabetes Father    Prostate cancer Father    Diabetes Brother    Acute myelogenous leukemia Brother    Parkinson's disease Neg Hx     ALLERGIES:  is allergic to sulfa  antibiotics, amoxicillin-pot clavulanate, cefdinir, ciprofloxacin, and other.  MEDICATIONS:  Current Outpatient Medications  Medication Sig Dispense Refill   atenolol (TENORMIN) 25 MG tablet Take 1 tablet (25 mg total) by mouth daily. 90 tablet 0   busPIRone (BUSPAR) 7.5 MG tablet TAKE 1 TABLET BY MOUTH 2 TIMES DAILY AS NEEDED. 180 tablet 0   carbidopa-levodopa (SINEMET IR) 25-100 MG tablet Take 2 tablets by mouth in the morning, at noon, and at bedtime. 180 tablet 2   escitalopram (LEXAPRO) 20 MG tablet Take 1 tablet (20 mg total) by mouth daily at 2 PM. 90 tablet 0   omeprazole (PRILOSEC) 20 MG capsule Take 1 capsule (20 mg total) by mouth daily at 2 PM. 90 capsule 0   triamcinolone (NASACORT) 55 MCG/ACT AERO nasal inhaler Place 2 sprays into the nose daily.     No current facility-administered medications for this visit.    REVIEW OF SYSTEMS:   Review of Systems  Constitutional:  Negative for appetite change, fatigue and unexpected weight change.  Respiratory:  Negative for cough and shortness of breath.   Cardiovascular:  Negative for chest pain.  All other systems reviewed and are negative.   PHYSICAL EXAMINATION: ECOG PERFORMANCE STATUS: 1 - Symptomatic but completely ambulatory  Vitals:   11/11/20 1314  BP: 111/65  Pulse: 64  Resp: 18  Temp: 97.9 F (36.6 C)  SpO2: 99%   Filed Weights   11/11/20 1314  Weight: 122 lb 2.2 oz (55.4 kg)   Physical Exam Vitals reviewed.  Constitutional:      Appearance: Normal appearance.  Cardiovascular:     Rate and Rhythm: Normal rate and regular rhythm.     Pulses: Normal pulses.     Heart sounds: Normal heart sounds.  Pulmonary:     Effort: Pulmonary effort is normal.     Breath sounds: Normal breath sounds.  Neurological:     General: No focal deficit present.     Mental Status: She is alert and oriented to person, place, and time.  Psychiatric:        Mood and Affect: Mood normal.        Behavior: Behavior normal.      LABORATORY DATA:  I have reviewed the data as listed CBC Latest Ref Rng & Units 11/04/2020 09/12/2020 09/04/2020  WBC 3.4 - 10.8 x10E3/uL 8.5 6.4 11.3(H)  Hemoglobin 11.1 - 15.9 g/dL 12.4 11.1 10.7(L)  Hematocrit 34.0 - 46.6 % 37.0 34.5 32.9(L)  Platelets 150 - 450 x10E3/uL 296 492(H) 297   CMP Latest Ref Rng & Units 11/04/2020 09/12/2020 09/04/2020  Glucose 65 - 99 mg/dL 93 149(H) 106(H)  BUN 8 - 27 mg/dL 24 6(L) 14  Creatinine 0.57 -  1.00 mg/dL 0.82 0.64 0.60  Sodium 134 - 144 mmol/L 135 140 141  Potassium 3.5 - 5.2 mmol/L 4.3 4.4 2.7(L)  Chloride 96 - 106 mmol/L 98 101 94(L)  CO2 20 - 29 mmol/L 21 21 32(H)  Calcium 8.7 - 10.3 mg/dL 9.8 9.4 7.8(L)  Total Protein 6.0 - 8.5 g/dL 7.2 7.3 6.8  Total Bilirubin 0.0 - 1.2 mg/dL 0.4 0.4 0.6  Alkaline Phos 44 - 121 IU/L 65 112 134(H)  AST 0 - 40 IU/L 31 41(H) 26  ALT 0 - 32 IU/L 10 15 18     RADIOGRAPHIC STUDIES: I have personally reviewed the radiological images as listed and agreed with the findings in the report. No results found.  ASSESSMENT:  1.  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 - Right lower lobectomy in March 2020, 1.2 cm primary. - Did not require any adjuvant chemotherapy. - Moved to [place] area in April 2022 to be closer to her daughter. - Hospitalized in June for sepsis.  CT scan of the chest, abdomen and pelvis with contrast on 08/15/2020 with no definite evidence of recurrence of malignancy. 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  2.  Social/family history: - Lives at home with her husband.  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  Smoked for 3 years, 3 to 4 cigarettes/day in her 75s. - Father had prostate cancer.  Brother was recently diagnosed with AML.   PLAN:  1.   Stage I right lower lobe lung adenocarcinoma: - I have reviewed records from [facility] clinic where her prior care was performed.  She does not have any clinical signs or symptoms of recurrence at this time. - Reviewed CT chest from 08/15/2020 which showed small right and trace left pleural effusions with resection of the superior segment of the right lower lobe performed.  No masses found.  No mediastinal or hilar adenopathy.  No abdominal metastatic disease. - Would recommend repeat imaging of the chest without contrast in December.  If everything is stable, will consider repeating CT scan once every 12 months.   All questions were answered. The patient knows to call the clinic with any problems, questions or concerns.   Derek Jack, MD, 11/11/20 3:34 PM  [place] [phone]   I, Thana Ates, am acting as a scribe for Dr. Derek Jack.  I, Derek Jack MD, have reviewed the above documentation for accuracy and completeness, and I agree with the above.

## 2020-11-10 ENCOUNTER — Encounter: Payer: Self-pay | Admitting: Neurology

## 2020-11-11 ENCOUNTER — Other Ambulatory Visit: Payer: Self-pay

## 2020-11-11 ENCOUNTER — Inpatient Hospital Stay (HOSPITAL_COMMUNITY): Payer: Medicare Other | Attending: Hematology | Admitting: Hematology

## 2020-11-11 ENCOUNTER — Encounter (HOSPITAL_COMMUNITY): Payer: Self-pay | Admitting: Hematology

## 2020-11-11 DIAGNOSIS — Z818 Family history of other mental and behavioral disorders: Secondary | ICD-10-CM | POA: Diagnosis not present

## 2020-11-11 DIAGNOSIS — Z87891 Personal history of nicotine dependence: Secondary | ICD-10-CM | POA: Diagnosis not present

## 2020-11-11 DIAGNOSIS — Z79899 Other long term (current) drug therapy: Secondary | ICD-10-CM | POA: Insufficient documentation

## 2020-11-11 DIAGNOSIS — K219 Gastro-esophageal reflux disease without esophagitis: Secondary | ICD-10-CM | POA: Insufficient documentation

## 2020-11-11 DIAGNOSIS — G20 Parkinson's disease: Secondary | ICD-10-CM | POA: Diagnosis not present

## 2020-11-11 DIAGNOSIS — C3431 Malignant neoplasm of lower lobe, right bronchus or lung: Secondary | ICD-10-CM | POA: Diagnosis present

## 2020-11-11 DIAGNOSIS — Z806 Family history of leukemia: Secondary | ICD-10-CM | POA: Insufficient documentation

## 2020-11-11 DIAGNOSIS — C3491 Malignant neoplasm of unspecified part of right bronchus or lung: Secondary | ICD-10-CM

## 2020-11-11 DIAGNOSIS — F419 Anxiety disorder, unspecified: Secondary | ICD-10-CM | POA: Insufficient documentation

## 2020-11-11 DIAGNOSIS — Z833 Family history of diabetes mellitus: Secondary | ICD-10-CM | POA: Diagnosis not present

## 2020-11-11 DIAGNOSIS — Z8249 Family history of ischemic heart disease and other diseases of the circulatory system: Secondary | ICD-10-CM | POA: Diagnosis not present

## 2020-11-11 DIAGNOSIS — Z8042 Family history of malignant neoplasm of prostate: Secondary | ICD-10-CM | POA: Diagnosis not present

## 2020-11-11 NOTE — Progress Notes (Signed)
I met with the patient and her husband during and after initial visit with Dr. Delton Coombes. I provided my contact information and encouraged the patient and family to call with questions or concerns.

## 2020-11-11 NOTE — Patient Instructions (Addendum)
[place] at [facility] Discharge Instructions  You were seen and examined today by Dr. Delton Coombes. Dr. Delton Coombes discussed your past medical history, including your history of lung cancer.  Dr. Delton Coombes reviewed your recent CT scan from when you were hospitalized and it looks good. No sign of cancer! Dr. Delton Coombes has recommended a follow-up CT scan in December with a follow-up after to review those results.  Follow-up as scheduled.   Thank you for choosing [place] at [facility] to provide your oncology and hematology care.  To afford each patient quality time with our provider, please arrive at least 15 minutes before your scheduled appointment time.   If you have a lab appointment with the [facility] please come in thru the Main Entrance and check in at the main information desk.  You need to re-schedule your appointment should you arrive 10 or more minutes late.  We strive to give you quality time with our providers, and arriving late affects you and other patients whose appointments are after yours.  Also, if you no show three or more times for appointments you may be dismissed from the clinic at the providers discretion.     Again, thank you for choosing [facility].  Our hope is that these requests will decrease the amount of time that you wait before being seen by our physicians.       _____________________________________________________________  Should you have questions after your visit to [facility], please contact our office at [phone] and follow the prompts.  Our office hours are 8:00 a.m. and 4:30 p.m. Monday - Friday.  Please note that voicemails left after 4:00 p.m. may not be returned until the following business day.  We are closed weekends and major holidays.  You do have access to a nurse 24-7, just call the main number to the clinic [phone] and do not press any options, hold on  the line and a nurse will answer the phone.    For prescription refill requests, have your pharmacy contact our office and allow 72 hours.    Due to Covid, you will need to wear a mask upon entering the hospital. If you do not have a mask, a mask will be given to you at the Main Entrance upon arrival. For doctor visits, patients may have 1 support person age 74 or older with them. For treatment visits, patients can not have anyone with them due to social distancing guidelines and our immunocompromised population.

## 2020-11-17 ENCOUNTER — Encounter: Payer: Self-pay | Admitting: Family Medicine

## 2020-11-17 ENCOUNTER — Other Ambulatory Visit (HOSPITAL_COMMUNITY): Payer: Self-pay | Admitting: Neurology

## 2020-11-17 DIAGNOSIS — L659 Nonscarring hair loss, unspecified: Secondary | ICD-10-CM

## 2020-11-17 DIAGNOSIS — G20 Parkinson's disease: Secondary | ICD-10-CM

## 2020-11-21 ENCOUNTER — Other Ambulatory Visit (HOSPITAL_COMMUNITY): Payer: PRIVATE HEALTH INSURANCE

## 2020-11-25 NOTE — Addendum Note (Signed)
Addended by: Loman Brooklyn on: 11/25/2020 10:07 AM   Modules accepted: Orders

## 2020-12-05 ENCOUNTER — Other Ambulatory Visit: Payer: Self-pay | Admitting: Family Medicine

## 2020-12-05 DIAGNOSIS — Z1231 Encounter for screening mammogram for malignant neoplasm of breast: Secondary | ICD-10-CM

## 2020-12-15 ENCOUNTER — Other Ambulatory Visit: Payer: Self-pay

## 2020-12-15 ENCOUNTER — Other Ambulatory Visit: Payer: Medicare Other

## 2020-12-16 ENCOUNTER — Other Ambulatory Visit (HOSPITAL_COMMUNITY): Payer: PRIVATE HEALTH INSURANCE

## 2021-01-08 ENCOUNTER — Telehealth: Payer: Self-pay | Admitting: Family Medicine

## 2021-01-08 NOTE — Telephone Encounter (Signed)
Spoke with lab and gave patient [facility] customer service number  [phone]

## 2021-01-09 ENCOUNTER — Other Ambulatory Visit: Payer: Self-pay | Admitting: Family Medicine

## 2021-01-09 DIAGNOSIS — R000 Tachycardia, unspecified: Secondary | ICD-10-CM

## 2021-01-09 DIAGNOSIS — F411 Generalized anxiety disorder: Secondary | ICD-10-CM

## 2021-01-13 ENCOUNTER — Other Ambulatory Visit: Payer: Self-pay | Admitting: Family Medicine

## 2021-01-13 DIAGNOSIS — K219 Gastro-esophageal reflux disease without esophagitis: Secondary | ICD-10-CM

## 2021-01-14 ENCOUNTER — Other Ambulatory Visit: Payer: Self-pay | Admitting: Family Medicine

## 2021-01-14 ENCOUNTER — Ambulatory Visit: Payer: Medicare Other | Admitting: Neurology

## 2021-01-14 DIAGNOSIS — G20A1 Parkinson's disease without dyskinesia, without mention of fluctuations: Secondary | ICD-10-CM

## 2021-01-14 DIAGNOSIS — G20 Parkinson's disease: Secondary | ICD-10-CM

## 2021-02-10 ENCOUNTER — Ambulatory Visit (HOSPITAL_COMMUNITY)
Admission: RE | Admit: 2021-02-10 | Discharge: 2021-02-10 | Disposition: A | Discharge status: Home / Self Care | Payer: Medicare Other | Source: Ambulatory Visit | Attending: Hematology | Admitting: Hematology

## 2021-02-10 ENCOUNTER — Other Ambulatory Visit: Payer: Self-pay

## 2021-02-10 ENCOUNTER — Encounter (HOSPITAL_COMMUNITY): Payer: Self-pay

## 2021-02-10 DIAGNOSIS — C3491 Malignant neoplasm of unspecified part of right bronchus or lung: Secondary | ICD-10-CM | POA: Diagnosis present

## 2021-02-13 ENCOUNTER — Encounter: Payer: Self-pay | Admitting: Family Medicine

## 2021-02-13 DIAGNOSIS — I700 Atherosclerosis of aorta: Secondary | ICD-10-CM

## 2021-02-16 MED ORDER — ROSUVASTATIN CALCIUM 5 MG PO TABS: 5.0000 mg | ORAL_TABLET | Freq: Every day | ORAL | 2 refills | Status: DC

## 2021-02-17 NOTE — Progress Notes (Shared)
Deborah [name], Deborah [name]   CLINIC:  Medical Oncology/Hematology  PCP:  Deborah [name], Deborah [name] [phone]   REASON FOR VISIT:  Follow-up for lung cancer  PRIOR THERAPY: Right lower lobectomy in March 2020  CURRENT THERAPY: surveillance  BRIEF ONCOLOGIC HISTORY:  Oncology History  Adenocarcinoma of right lung ([place])  11/09/2020 Initial Diagnosis   Adenocarcinoma of right lung ([place])   11/09/2020 Cancer Staging   Staging form: Lung, AJCC 8th Edition - Clinical stage from 11/09/2020: ycT1, cN0, cM0 - Signed by Derek Jack, MD on 11/09/2020 Stage prefix: Post-therapy Response to neoadjuvant therapy: Complete response      CANCER STAGING: Cancer Staging  Adenocarcinoma of right lung [facility]) Staging form: Lung, AJCC 8th Edition - Clinical stage from 11/09/2020: ycT1, cN0, cM0 - Signed by Derek Jack, MD on 11/09/2020   INTERVAL HISTORY:  [name], a 75 y.o. female, returns for routine follow-up of her right lung cancer. [name] was last seen on 11/11/2020.  ***   REVIEW OF SYSTEMS:  Review of Systems - Oncology  PAST MEDICAL/SURGICAL HISTORY:  Past Medical History:  Diagnosis Date   Anxiety    Arthritis    Asthma    GERD (gastroesophageal reflux disease)    Hiatal hernia    Hyperlipidemia    Lung cancer ([place])    Osteopenia    Parkinson's syndrome ([place])    Raynaud's disease    Past Surgical History:  Procedure Laterality Date   BACK SURGERY  1995   Decompress disc lumbar   BREAST BIOPSY  1995   CATARACT EXTRACTION, BILATERAL Bilateral 2017   COLONOSCOPY  08/20/2015   repeat in 5 years   ESOPHAGOGASTRODUODENOSCOPY  2021   LUNG REMOVAL, PARTIAL Right    REFRACTIVE SURGERY Left 08/24/2016    SOCIAL HISTORY:  Social History   Socioeconomic History   Marital status: Married    Spouse name: [name]   Number of children: 1   Years of education: Not on  file   Highest education level: Not on file  Occupational History   Occupation: retired  Tobacco Use   Smoking status: Former    Packs/day: 0.25    Years: 3.00    Pack years: 0.75    Types: Cigarettes    Quit date: 07/23/1960    Years since quitting: 60.6   Smokeless tobacco: Never  Vaping Use   Vaping Use: Never used  Substance and Sexual Activity   Alcohol use: Not Currently    Alcohol/week: 0.0 - 1.0 standard drinks    Comment: occ   Drug use: Never   Sexual activity: Yes    Partners: Male    Birth control/protection: None  Other Topics Concern   Not on file  Social History Narrative   Moved here from [place] with husband [name] beginning of 2022. Their child lives nearby.   Social Determinants of Health   Financial Resource Strain: Low Risk    Difficulty of Paying Living Expenses: Not hard at all  Food Insecurity: No Food Insecurity   Worried About Charity fundraiser in the Last Year: Never true   [place] in the Last Year: Never true  Transportation Needs: No Transportation Needs   Lack of Transportation (Medical): No   Lack of Transportation (Non-Medical): No  Physical Activity: Sufficiently Active   Days of Exercise per Week: 7 days   Minutes of Exercise per  Session: 30 min  Stress: Stress Concern Present   Feeling of Stress : To some extent  Social Connections: Moderately Isolated   Frequency of Communication with Friends and Family: More than three times a week   Frequency of Social Gatherings with Friends and Family: Twice a week   Attends Religious Services: Never   Marine scientist or Organizations: No   Attends Music therapist: Never   Marital Status: Married  Human resources officer Violence: Not At Risk   Fear of Current or Ex-Partner: No   Emotionally Abused: No   Physically Abused: No   Sexually Abused: No    FAMILY HISTORY:  Family History  Problem Relation Age of Onset   Dementia Mother    Heart disease Father     Diabetes Father    Prostate cancer Father    Diabetes Brother    Acute myelogenous leukemia Brother    Parkinson's disease Neg Hx     CURRENT MEDICATIONS:  Current Outpatient Medications  Medication Sig Dispense Refill   atenolol (TENORMIN) 25 MG tablet TAKE 1 TABLET (25 MG TOTAL) BY MOUTH DAILY. 90 tablet 0   busPIRone (BUSPAR) 7.5 MG tablet TAKE 1 TABLET BY MOUTH 2 TIMES DAILY AS NEEDED. 180 tablet 0   carbidopa-levodopa (SINEMET IR) 25-100 MG tablet TAKE 2 TABLETS BY MOUTH IN THE MORNING, AT NOON, AND AT BEDTIME. 540 tablet 0   escitalopram (LEXAPRO) 20 MG tablet TAKE 1 TABLET (20 MG TOTAL) BY MOUTH DAILY AT 2 PM. 90 tablet 0   omeprazole (PRILOSEC) 20 MG capsule TAKE 1 CAPSULE (20 MG TOTAL) BY MOUTH DAILY AT 2 PM. 90 capsule 0   rosuvastatin (CRESTOR) 5 MG tablet Take 1 tablet (5 mg total) by mouth daily. 30 tablet 2   triamcinolone (NASACORT) 55 MCG/ACT AERO nasal inhaler Place 2 sprays into the nose daily.     No current facility-administered medications for this visit.    ALLERGIES:  Allergies  Allergen Reactions   Sulfa Antibiotics Hives and Itching   Amoxicillin-Pot Clavulanate Nausea And Vomiting   Cefdinir Nausea And Vomiting   Ciprofloxacin Hives and Itching   Other     Bioxin     PHYSICAL EXAM:  Performance status (ECOG): 1 - Symptomatic but completely ambulatory  There were no vitals filed for this visit. Wt Readings from Last 3 Encounters:  11/11/20 122 lb 2.2 oz (55.4 kg)  11/04/20 120 lb 3.2 oz (54.5 kg)  10/28/20 119 lb 12.8 oz (54.3 kg)   Physical Exam   LABORATORY DATA:  I have reviewed the labs as listed.  CBC Latest Ref Rng & Units 11/04/2020 09/12/2020 09/04/2020  WBC 3.4 - 10.8 x10E3/uL 8.5 6.4 11.3(H)  Hemoglobin 11.1 - 15.9 g/dL 12.4 11.1 10.7(L)  Hematocrit 34.0 - 46.6 % 37.0 34.5 32.9(L)  Platelets 150 - 450 x10E3/uL 296 492(H) 297   CMP Latest Ref Rng & Units 11/04/2020 09/12/2020 09/04/2020  Glucose 65 - 99 mg/dL 93 149(H) 106(H)  BUN 8 - 27  mg/dL 24 6(L) 14  Creatinine 0.57 - 1.00 mg/dL 0.82 0.64 0.60  Sodium 134 - 144 mmol/L 135 140 141  Potassium 3.5 - 5.2 mmol/L 4.3 4.4 2.7(L)  Chloride 96 - 106 mmol/L 98 101 94(L)  CO2 20 - 29 mmol/L 21 21 32(H)  Calcium 8.7 - 10.3 mg/dL 9.8 9.4 7.8(L)  Total Protein 6.0 - 8.5 g/dL 7.2 7.3 6.8  Total Bilirubin 0.0 - 1.2 mg/dL 0.4 0.4 0.6  Alkaline Phos 44 -  121 IU/L 65 112 134(H)  AST 0 - 40 IU/L 31 41(H) 26  ALT 0 - 32 IU/L 10 15 18     DIAGNOSTIC IMAGING:  I have independently reviewed the scans and discussed with the patient. CT Chest Wo Contrast  Result Date: 02/11/2021 CLINICAL DATA:  Right upper lobe lung cancer, status post wedge resection EXAM: CT CHEST WITHOUT CONTRAST TECHNIQUE: Multidetector CT imaging of the chest was performed following the standard protocol without IV contrast. COMPARISON:  08/14/2020 FINDINGS: Cardiovascular: Aortic atherosclerosis. Normal heart size. Left and right coronary artery calcifications. No pericardial effusion. Mediastinum/Nodes: No enlarged mediastinal, hilar, or axillary lymph nodes. Thyroid gland, trachea, and esophagus demonstrate no significant findings. Lungs/Pleura: Status post right lower lobectomy. Interval resolution of previously seen right pleural effusion. No pleural effusion or pneumothorax. Upper Abdomen: No acute abnormality. Punctuate parenchymal calcifications of the spleen, in keeping with prior granulomatous infection. Musculoskeletal: No chest wall mass or suspicious bone lesions identified. Pectus deformity. IMPRESSION: 1. Status post right lower lobectomy. 2. No evidence of recurrent or metastatic disease in the chest. 3. Coronary artery disease. Aortic Atherosclerosis (ICD10-I70.0). Electronically Signed   By: Delanna Ahmadi M.D.   On: 02/11/2021 10:22     ASSESSMENT:  1.  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 - Right lower lobectomy in March 2020, 1.2 cm primary. - Did not require any adjuvant chemotherapy. - Moved to [place] area in April 2022 to be closer to her daughter. - Hospitalized in June for sepsis.  CT scan of the chest, abdomen and pelvis with contrast on 08/15/2020 with no definite evidence of recurrence of malignancy. 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  2.  Social/family history: - Lives at home with her husband.  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  Smoked for 3 years, 3 to 4 cigarettes/day in her 61s. - Father had prostate cancer.  Brother was recently diagnosed with AML.   PLAN:  1.  Stage I right lower lobe lung adenocarcinoma: - I have reviewed records from [facility] clinic where her prior care was performed.  She does not have any clinical signs or symptoms of recurrence at this time. - Reviewed CT chest from 08/15/2020 which showed small right and trace left pleural effusions with resection of the superior segment of the right lower lobe performed.  No masses found.  No mediastinal or hilar adenopathy.  No abdominal metastatic disease. - Would recommend repeat imaging of the chest without contrast in December.  If everything is stable, will consider repeating CT scan once every 12 months.   Orders placed this encounter:  No orders of the defined types were placed in this encounter.    Derek Jack, MD [place] [phone]   I, Thana Ates, am acting as a scribe for Dr. Derek Jack.  {Add Barista Statement}

## 2021-02-18 ENCOUNTER — Encounter (HOSPITAL_COMMUNITY): Payer: Self-pay

## 2021-02-18 ENCOUNTER — Other Ambulatory Visit: Payer: Self-pay

## 2021-02-18 ENCOUNTER — Inpatient Hospital Stay (HOSPITAL_COMMUNITY): Payer: Medicare Other | Admitting: Hematology

## 2021-02-23 ENCOUNTER — Encounter (HOSPITAL_COMMUNITY): Payer: Self-pay | Admitting: Hematology

## 2021-02-23 ENCOUNTER — Inpatient Hospital Stay (HOSPITAL_COMMUNITY): Payer: Medicare Other | Attending: Hematology | Admitting: Hematology

## 2021-02-23 ENCOUNTER — Other Ambulatory Visit: Payer: Self-pay

## 2021-02-23 VITALS — Wt 122.0 lb

## 2021-02-23 DIAGNOSIS — C3491 Malignant neoplasm of unspecified part of right bronchus or lung: Secondary | ICD-10-CM | POA: Diagnosis not present

## 2021-02-23 NOTE — Progress Notes (Signed)
Virtual Visit via Telephone Note  I connected with Deborah [name]. Deborah [name] on 02/23/21 at  3:30 PM EST by telephone and verified that I am speaking with the correct person using two identifiers.  Location: Patient: At home Provider: In the office   I discussed the limitations, risks, security and privacy concerns of performing an evaluation and management service by telephone and the availability of in person appointments. I also discussed with the patient that there may be a patient responsible charge related to this service. The patient expressed understanding and agreed to proceed.   History of Present Illness: She was seen in our clinic for follow-up of right lower lobe lung cancer.   Observations/Objective: She does not report any chest pains or cough.  She reports appetite and energy levels at 100%.  Assessment and Plan:  1.  Stage I right lower lobe lung adenocarcinoma: - Right lower lobectomy in March 2020, 1.2 cm primary, did not require any adjuvant chemotherapy. - I have reviewed CT of the chest from 02/10/2021 which did not show any evidence of recurrence or metastatic disease.  Findings of right lower lobectomy was seen.  Incidental finding of coronary artery disease was also discussed with the patient. - Patient does not report any symptoms of chest pain at this time. - Recommend follow-up in 1 year with repeat imaging.   Follow Up Instructions: RTC 1 year with scan and labs.   I discussed the assessment and treatment plan with the patient. The patient was provided an opportunity to ask questions and all were answered. The patient agreed with the plan and demonstrated an understanding of the instructions.   The patient was advised to call back or seek an in-person evaluation if the symptoms worsen or if the condition fails to improve as anticipated.  I provided 21 minutes of non-face-to-face time during this encounter.   Derek Jack, MD

## 2021-02-24 ENCOUNTER — Other Ambulatory Visit (HOSPITAL_COMMUNITY): Payer: Self-pay

## 2021-02-24 DIAGNOSIS — C3491 Malignant neoplasm of unspecified part of right bronchus or lung: Secondary | ICD-10-CM

## 2021-02-24 NOTE — Progress Notes (Signed)
Message received from Dr. Delton Coombes- Hi, can you please put an order for CT chest without contrast in 1 year along with CBC and CMP.  Reason for CT is lung cancer surveillance.  Thanks.  Orders placed per Dr. Tomie China orders.

## 2021-03-04 ENCOUNTER — Ambulatory Visit
Admission: RE | Admit: 2021-03-04 | Discharge: 2021-03-04 | Disposition: A | Discharge status: Home / Self Care | Payer: Medicare Other | Source: Ambulatory Visit | Attending: Family Medicine | Admitting: Family Medicine

## 2021-03-04 DIAGNOSIS — Z1231 Encounter for screening mammogram for malignant neoplasm of breast: Secondary | ICD-10-CM

## 2021-03-26 ENCOUNTER — Other Ambulatory Visit: Payer: Self-pay | Admitting: Family Medicine

## 2021-03-26 DIAGNOSIS — R928 Other abnormal and inconclusive findings on diagnostic imaging of breast: Secondary | ICD-10-CM

## 2021-04-02 ENCOUNTER — Other Ambulatory Visit: Payer: Self-pay | Admitting: Family Medicine

## 2021-04-02 DIAGNOSIS — F411 Generalized anxiety disorder: Secondary | ICD-10-CM

## 2021-04-02 DIAGNOSIS — R000 Tachycardia, unspecified: Secondary | ICD-10-CM

## 2021-04-02 DIAGNOSIS — G20 Parkinson's disease: Secondary | ICD-10-CM

## 2021-05-05 ENCOUNTER — Ambulatory Visit: Payer: Medicare Other | Admitting: Family Medicine

## 2021-05-11 ENCOUNTER — Other Ambulatory Visit: Payer: Self-pay

## 2021-05-11 ENCOUNTER — Ambulatory Visit: Payer: Medicare Other

## 2021-05-11 ENCOUNTER — Ambulatory Visit
Admission: RE | Admit: 2021-05-11 | Discharge: 2021-05-11 | Disposition: A | Discharge status: Home / Self Care | Payer: Medicare Other | Source: Ambulatory Visit | Attending: Family Medicine | Admitting: Family Medicine

## 2021-05-11 DIAGNOSIS — R928 Other abnormal and inconclusive findings on diagnostic imaging of breast: Secondary | ICD-10-CM

## 2021-06-09 ENCOUNTER — Encounter: Payer: Self-pay | Admitting: Family Medicine

## 2021-06-09 ENCOUNTER — Ambulatory Visit (INDEPENDENT_AMBULATORY_CARE_PROVIDER_SITE_OTHER): Payer: Medicare Other | Admitting: Family Medicine

## 2021-06-09 VITALS — BP 123/73 | HR 62 | Temp 97.7°F | Ht 66.0 in | Wt 130.8 lb

## 2021-06-09 DIAGNOSIS — J302 Other seasonal allergic rhinitis: Secondary | ICD-10-CM | POA: Diagnosis not present

## 2021-06-09 DIAGNOSIS — F411 Generalized anxiety disorder: Secondary | ICD-10-CM

## 2021-06-09 DIAGNOSIS — G20C Parkinsonism, unspecified: Secondary | ICD-10-CM

## 2021-06-09 DIAGNOSIS — R000 Tachycardia, unspecified: Secondary | ICD-10-CM

## 2021-06-09 DIAGNOSIS — K219 Gastro-esophageal reflux disease without esophagitis: Secondary | ICD-10-CM

## 2021-06-09 DIAGNOSIS — G20 Parkinson's disease: Secondary | ICD-10-CM

## 2021-06-09 DIAGNOSIS — I700 Atherosclerosis of aorta: Secondary | ICD-10-CM

## 2021-06-09 DIAGNOSIS — I2510 Atherosclerotic heart disease of native coronary artery without angina pectoris: Secondary | ICD-10-CM

## 2021-06-09 DIAGNOSIS — M8580 Other specified disorders of bone density and structure, unspecified site: Secondary | ICD-10-CM | POA: Insufficient documentation

## 2021-06-09 MED ORDER — LEVOCETIRIZINE DIHYDROCHLORIDE 5 MG PO TABS: 5.0000 mg | ORAL_TABLET | Freq: Every evening | ORAL | 3 refills | Status: DC

## 2021-06-09 MED ORDER — ATENOLOL 25 MG PO TABS: 25.0000 mg | ORAL_TABLET | Freq: Every day | ORAL | 3 refills | Status: DC

## 2021-06-09 MED ORDER — CARBIDOPA-LEVODOPA 25-100 MG PO TABS: 2.0000 | ORAL_TABLET | Freq: Three times a day (TID) | ORAL | 3 refills | Status: DC

## 2021-06-09 MED ORDER — ESCITALOPRAM OXALATE 20 MG PO TABS: 20.0000 mg | ORAL_TABLET | Freq: Every day | ORAL | 3 refills | Status: DC

## 2021-06-09 NOTE — Progress Notes (Signed)
? ?Assessment & Plan:  ?1. Generalized anxiety disorder ?Well controlled on current regimen.  ?- escitalopram (LEXAPRO) 20 MG tablet; Take 1 tablet (20 mg total) by mouth daily.  Dispense: 90 tablet; Refill: 3 ? ?2. Parkinsonism, unspecified Parkinsonism type ([place]) ?Well controlled on current regimen.  ?- carbidopa-levodopa (SINEMET IR) 25-100 MG tablet; Take 2 tablets by mouth in the morning, at noon, and at bedtime.  Dispense: 540 tablet; Refill: 3 ? ?3. Tachycardia ?Well controlled on current regimen.  ?- atenolol (TENORMIN) 25 MG tablet; Take 1 tablet (25 mg total) by mouth daily.  Dispense: 90 tablet; Refill: 3 ? ?4. Seasonal allergies ?Uncontrolled. Continue Nasacort. Stop Zyrtec. Start Xyzal.  ?- levocetirizine (XYZAL) 5 MG tablet; Take 1 tablet (5 mg total) by mouth every evening.  Dispense: 90 tablet; Refill: 3 ? ?5. Gastroesophageal reflux disease without esophagitis ?Resolved. ? ?6. Osteopenia, unspecified location ?Continue calcium and vitamin D supplements.  ?- cholecalciferol (VITAMIN D3) 25 MCG (1000 UNIT) tablet; Take 1,000 Units by mouth daily. ?- Calcium Carbonate-Vit D-Min (CALCIUM 1200 PO); Take 1,200 mg by mouth daily. ? ?7-8. Aortic atherosclerosis (HCC)/Coronary artery disease involving native coronary artery of native heart without angina pectoris ?Advised by oncology she did not need a statin per her report. ? ? ?Return in about 1 year (around 06/10/2022) for annual physical. ? ?Hendricks Limes, MSN, APRN, FNP-C ?[place] ? ?Subjective:  ? ? Patient ID: Deborah [name]. Blea, female    DOB: 06-08-1945, 76 y.o.   MRN: [id number] ? ?Patient Care Team: ?Loman Brooklyn, FNP as PCP - General (Family Medicine) ?Brien Mates, RN as Oncology Nurse Navigator (Oncology) ?Derek Jack, MD as Medical Oncologist (Oncology)  ? ?Chief Complaint:  ?Chief Complaint  ?Patient presents with  ? Medical Management of Chronic Issues  ? Allergies  ? ? ?HPI: ?Deborah [name]. Tartaglia is a 76  y.o. female presenting on 06/09/2021 for Medical Management of Chronic Issues and Allergies ? ?Anxiety: taking Lexapro 20 mg daily. ? ? ?  11/04/2020  ?  9:54 AM 09/04/2020  ? 10:04 AM 07/23/2020  ?  9:39 AM  ?GAD 7 : Generalized Anxiety Score  ?Nervous, Anxious, on Edge 0 1 0  ?Control/stop worrying 0 1 0  ?Worry too much - different things 0 1 0  ?Trouble relaxing 0 1 0  ?Restless 0 1 0  ?Easily annoyed or irritable 0 1 0  ?Afraid - awful might happen 0 1 0  ?Total GAD 7 Score 0 7 0  ?Anxiety Difficulty Not difficult at all Somewhat difficult Not difficult at all  ? ? ?  11/04/2020  ?  9:53 AM 09/17/2020  ?  9:22 AM 09/04/2020  ? 10:03 AM  ?Depression screen PHQ 2/9  ?Decreased Interest 0 1 1  ?Down, Depressed, Hopeless 0 1 1  ?PHQ - 2 Score 0 2 2  ?Altered sleeping 0 1 1  ?Tired, decreased energy 0 1 1  ?Change in appetite 0 1 1  ?Feeling bad or failure about yourself  0 1 1  ?Trouble concentrating 0 1 1  ?Moving slowly or fidgety/restless 0 1 1  ?Suicidal thoughts 0 1 1  ?PHQ-9 Score 0 9 9  ?Difficult doing work/chores Not difficult at all Somewhat difficult Somewhat difficult  ? ? ?Parkinsonism Syndrome: patient is seeing neurology who does not see any telltale signs of parkinsonism. She is still taking Sinemet which may be masking symptoms. Patient was going to proceed with a DaTscan if covered by insurance,  but this never happened. ? ?GERD: patient stopped omeprazole seven months ago. She has not needed famotidine at all (this is what she was going to take instead). ? ?Allergies: patient has previously seen ENT who switched her nasal spray from Flonase to Nasacort. She has been taking Zytec daily for years. Patient reports runny nose, nasal congestion, and cough with sputum production consistent with her allergies. ? ?Osteopenia: taking vitamin D 1,000 units and calcium 1,200 mg once daily.  ? ?Atherosclerosis: previously prescribed rosuvastatin 5 mg daily due to coronary artery disease and aortic atherosclerosis.  She reports the day she was going to pick it up to start she saw her oncologist and he told her she didn't need a statin, so she didn't pick it up. ? ?New complaints: ?None ? ? ?Social history: ? ?Relevant past medical, surgical, family and social history reviewed and updated as indicated. Interim medical history since our last visit reviewed. ? ?Allergies and medications reviewed and updated. ? ?DATA REVIEWED: CHART IN EPIC ? ?ROS: Negative unless specifically indicated above in HPI.  ? ? ?Current Outpatient Medications:  ?  atenolol (TENORMIN) 25 MG tablet, Take 1 tablet (25 mg total) by mouth daily., Disp: 90 tablet, Rfl: 0 ?  carbidopa-levodopa (SINEMET IR) 25-100 MG tablet, Take 2 tablets by mouth in the morning, at noon, and at bedtime., Disp: 540 tablet, Rfl: 0 ?  escitalopram (LEXAPRO) 20 MG tablet, Take 1 tablet (20 mg total) by mouth daily., Disp: 90 tablet, Rfl: 0 ?  triamcinolone (NASACORT) 55 MCG/ACT AERO nasal inhaler, Place 2 sprays into the nose daily., Disp: , Rfl:  ?  rosuvastatin (CRESTOR) 5 MG tablet, Take 1 tablet (5 mg total) by mouth daily. (Patient not taking: Reported on 06/09/2021), Disp: 30 tablet, Rfl: 2  ? ?Allergies  ?Allergen Reactions  ? Sulfa Antibiotics Hives and Itching  ? Amoxicillin-Pot Clavulanate Nausea And Vomiting  ? Cefdinir Nausea And Vomiting  ? Ciprofloxacin Hives and Itching  ? Other   ?  Bioxin   ? ?Past Medical History:  ?Diagnosis Date  ? Anxiety   ? Arthritis   ? Asthma   ? GERD (gastroesophageal reflux disease)   ? Hiatal hernia   ? Hyperlipidemia   ? Lung cancer ([place])   ? Osteopenia   ? Parkinson's syndrome ([place])   ? Raynaud's disease   ?  ?Past Surgical History:  ?Procedure Laterality Date  ? [place]  ? Decompress disc lumbar  ? BREAST BIOPSY  1995  ? CATARACT EXTRACTION, BILATERAL Bilateral 2017  ? COLONOSCOPY  08/20/2015  ? repeat in 5 years  ? ESOPHAGOGASTRODUODENOSCOPY  2021  ? LUNG REMOVAL, PARTIAL Right   ? REFRACTIVE SURGERY Left 08/24/2016  ?   ?Social History  ? ?Socioeconomic History  ? Marital status: Married  ?  Spouse name: [name]  ? Number of children: 1  ? Years of education: Not on file  ? Highest education level: Not on file  ?Occupational History  ? Occupation: retired  ?Tobacco Use  ? Smoking status: Former  ?  Packs/day: 0.25  ?  Years: 3.00  ?  Pack years: 0.75  ?  Types: Cigarettes  ?  Quit date: 07/23/1960  ?  Years since quitting: 60.9  ? Smokeless tobacco: Never  ?Vaping Use  ? Vaping Use: Never used  ?Substance and Sexual Activity  ? Alcohol use: Not Currently  ?  Alcohol/week: 0.0 - 1.0 standard drinks  ?  Comment: occ  ? Drug use:  Never  ? Sexual activity: Yes  ?  Partners: Male  ?  Birth control/protection: None  ?Other Topics Concern  ? Not on file  ?Social History Narrative  ? Moved here from [place] with husband [name] beginning of 2022. Their child lives nearby.  ? ?Social Determinants of Health  ? ?Financial Resource Strain: Low Risk   ? Difficulty of Paying Living Expenses: Not hard at all  ?Food Insecurity: No Food Insecurity  ? Worried About Charity fundraiser in the Last Year: Never true  ? Ran Out of Food in the Last Year: Never true  ?Transportation Needs: No Transportation Needs  ? Lack of Transportation (Medical): No  ? Lack of Transportation (Non-Medical): No  ?Physical Activity: Sufficiently Active  ? Days of Exercise per Week: 7 days  ? Minutes of Exercise per Session: 30 min  ?Stress: Stress Concern Present  ? Feeling of Stress : To some extent  ?Social Connections: Moderately Isolated  ? Frequency of Communication with Friends and Family: More than three times a week  ? Frequency of Social Gatherings with Friends and Family: Twice a week  ? Attends Religious Services: Never  ? Active Member of Clubs or Organizations: No  ? Attends Archivist Meetings: Never  ? Marital Status: Married  ?Intimate Partner Violence: Not At Risk  ? Fear of Current or Ex-Partner: No  ? Emotionally Abused: No  ? Physically Abused:  No  ? Sexually Abused: No  ?  ? ?   ?Objective:  ?  ?BP 123/73   Pulse 62   Temp 97.7 ?F (36.5 ?C) (Temporal)   Ht 5\' 6"  (1.676 m)   Wt 130 lb 12.8 oz (59.3 kg)   SpO2 100%   BMI 21.11 kg/m?  ? ?[place]

## 2021-09-18 ENCOUNTER — Ambulatory Visit (INDEPENDENT_AMBULATORY_CARE_PROVIDER_SITE_OTHER): Payer: Medicare Other

## 2021-09-18 VITALS — Wt 124.0 lb

## 2021-09-18 DIAGNOSIS — Z0000 Encounter for general adult medical examination without abnormal findings: Secondary | ICD-10-CM

## 2021-09-18 NOTE — Patient Instructions (Signed)
Deborah [name] , Thank you for taking time to come for your Medicare Wellness Visit. I appreciate your ongoing commitment to your health goals. Please review the following plan we discussed and let me know if I can assist you in the future.   Screening recommendations/referrals: Colonoscopy: Done 08/20/2015 - repeat in 7 years Mammogram: Done 03/04/2021 - Repeat annually Bone Density: Done 2021 - repeat in 3 years Recommended yearly ophthalmology/optometry visit for glaucoma screening and checkup Recommended yearly dental visit for hygiene and checkup  Vaccinations: Influenza vaccine: Done 11/03/2020 - Repeat annually Pneumococcal vaccine: Done 04/20/2013 & 12/13/2013 Tdap vaccine: Done 10/03/2012 - Repeat in 10 years Shingles vaccine: zostavax Done 2013; Due for Shingrix which is 2 doses 2-6 months apart and over 90% effective   Covid-19: Done  05/12/2019, 06/09/2019, 12/29/2019  Advanced directives: Please bring a copy of your health care power of attorney and living will to the office to be added to your chart at your convenience.   Conditions/risks identified: Keep up the great work! Aim for 30 minutes of exercise or brisk walking, 6-8 glasses of water, and 5 servings of fruits and vegetables each day.   Next appointment: Follow up in one year for your annual wellness visit    Preventive Care 65 Years and Older, Female Preventive care refers to lifestyle choices and visits with your health care provider that can promote health and wellness. What does preventive care include? A yearly physical exam. This is also called an annual well check. Dental exams once or twice a year. Routine eye exams. Ask your health care provider how often you should have your eyes checked. Personal lifestyle choices, including: Daily care of your teeth and gums. Regular physical activity. Eating a healthy diet. Avoiding tobacco and drug use. Limiting alcohol use. Practicing safe sex. Taking low-dose aspirin  every day. Taking vitamin and mineral supplements as recommended by your health care provider. What happens during an annual well check? The services and screenings done by your health care provider during your annual well check will depend on your age, overall health, lifestyle risk factors, and family history of disease. Counseling  Your health care provider may ask you questions about your: Alcohol use. Tobacco use. Drug use. Emotional well-being. Home and relationship well-being. Sexual activity. Eating habits. History of falls. Memory and ability to understand (cognition). Work and work Statistician. Reproductive health. Screening  You may have the following tests or measurements: Height, weight, and BMI. Blood pressure. Lipid and cholesterol levels. These may be checked every 5 years, or more frequently if you are over 62 years old. Skin check. Lung cancer screening. You may have this screening every year starting at age 27 if you have a 30-pack-year history of smoking and currently smoke or have quit within the past 15 years. Fecal occult blood test (FOBT) of the stool. You may have this test every year starting at age 24. Flexible sigmoidoscopy or colonoscopy. You may have a sigmoidoscopy every 5 years or a colonoscopy every 10 years starting at age 93. Hepatitis C blood test. Hepatitis B blood test. Sexually transmitted disease (STD) testing. Diabetes screening. This is done by checking your blood sugar (glucose) after you have not eaten for a while (fasting). You may have this done every 1-3 years. Bone density scan. This is done to screen for osteoporosis. You may have this done starting at age 55. Mammogram. This may be done every 1-2 years. Talk to your health care provider about how often you should  have regular mammograms. Talk with your health care provider about your test results, treatment options, and if necessary, the need for more tests. Vaccines  Your health  care provider may recommend certain vaccines, such as: Influenza vaccine. This is recommended every year. Tetanus, diphtheria, and acellular pertussis (Tdap, Td) vaccine. You may need a Td booster every 10 years. Zoster vaccine. You may need this after age 56. Pneumococcal 13-valent conjugate (PCV13) vaccine. One dose is recommended after age 47. Pneumococcal polysaccharide (PPSV23) vaccine. One dose is recommended after age 20. Talk to your health care provider about which screenings and vaccines you need and how often you need them. This information is not intended to replace advice given to you by your health care provider. Make sure you discuss any questions you have with your health care provider. Document Released: 03/21/2015 Document Revised: 11/12/2015 Document Reviewed: 12/24/2014 Elsevier Interactive Patient Education  2017 [place] Prevention in the Home Falls can cause injuries. They can happen to people of all ages. There are many things you can do to make your home safe and to help prevent falls. What can I do on the outside of my home? Regularly fix the edges of walkways and driveways and fix any cracks. Remove anything that might make you trip as you walk through a door, such as a raised step or threshold. Trim any bushes or trees on the path to your home. Use bright outdoor lighting. Clear any walking paths of anything that might make someone trip, such as rocks or tools. Regularly check to see if handrails are loose or broken. Make sure that both sides of any steps have handrails. Any raised decks and porches should have guardrails on the edges. Have any leaves, snow, or ice cleared regularly. Use sand or salt on walking paths during winter. Clean up any spills in your garage right away. This includes oil or grease spills. What can I do in the bathroom? Use night lights. Install grab bars by the toilet and in the tub and shower. Do not use towel bars as grab  bars. Use non-skid mats or decals in the tub or shower. If you need to sit down in the shower, use a plastic, non-slip stool. Keep the floor dry. Clean up any water that spills on the floor as soon as it happens. Remove soap buildup in the tub or shower regularly. Attach bath mats securely with double-sided non-slip rug tape. Do not have throw rugs and other things on the floor that can make you trip. What can I do in the bedroom? Use night lights. Make sure that you have a light by your bed that is easy to reach. Do not use any sheets or blankets that are too big for your bed. They should not hang down onto the floor. Have a firm chair that has side arms. You can use this for support while you get dressed. Do not have throw rugs and other things on the floor that can make you trip. What can I do in the kitchen? Clean up any spills right away. Avoid walking on wet floors. Keep items that you use a lot in easy-to-reach places. If you need to reach something above you, use a strong step stool that has a grab bar. Keep electrical cords out of the way. Do not use floor polish or wax that makes floors slippery. If you must use wax, use non-skid floor wax. Do not have throw rugs and other things on the floor  that can make you trip. What can I do with my stairs? Do not leave any items on the stairs. Make sure that there are handrails on both sides of the stairs and use them. Fix handrails that are broken or loose. Make sure that handrails are as long as the stairways. Check any carpeting to make sure that it is firmly attached to the stairs. Fix any carpet that is loose or worn. Avoid having throw rugs at the top or bottom of the stairs. If you do have throw rugs, attach them to the floor with carpet tape. Make sure that you have a light switch at the top of the stairs and the bottom of the stairs. If you do not have them, ask someone to add them for you. What else can I do to help prevent  falls? Wear shoes that: Do not have high heels. Have rubber bottoms. Are comfortable and fit you well. Are closed at the toe. Do not wear sandals. If you use a stepladder: Make sure that it is fully opened. Do not climb a closed stepladder. Make sure that both sides of the stepladder are locked into place. Ask someone to hold it for you, if possible. Clearly mark and make sure that you can see: Any grab bars or handrails. First and last steps. Where the edge of each step is. Use tools that help you move around (mobility aids) if they are needed. These include: Canes. Walkers. Scooters. Crutches. Turn on the lights when you go into a dark area. Replace any light bulbs as soon as they burn out. Set up your furniture so you have a clear path. Avoid moving your furniture around. If any of your floors are uneven, fix them. If there are any pets around you, be aware of where they are. Review your medicines with your doctor. Some medicines can make you feel dizzy. This can increase your chance of falling. Ask your doctor what other things that you can do to help prevent falls. This information is not intended to replace advice given to you by your health care provider. Make sure you discuss any questions you have with your health care provider. Document Released: 12/19/2008 Document Revised: 07/31/2015 Document Reviewed: 03/29/2014 Elsevier Interactive Patient Education  2017 [employer].

## 2021-09-18 NOTE — Progress Notes (Signed)
Subjective:   Marranda Arakelian. [place] is a 76 y.o. female who presents for Medicare Annual (Subsequent) preventive examination.  Virtual Visit via Telephone Note  I connected with  [name]. [place] on 09/18/21 at  9:00 AM EDT by telephone and verified that I am speaking with the correct person using two identifiers.  Location: Patient: Home Provider: [facility] Persons participating in the virtual visit: patient/Nurse Health Advisor   I discussed the limitations, risks, security and privacy concerns of performing an evaluation and management service by telephone and the availability of in person appointments. The patient expressed understanding and agreed to proceed.  Interactive audio and video telecommunications were attempted between this nurse and patient, however failed, due to patient having technical difficulties OR patient did not have access to video capability.  We continued and completed visit with audio only.  Some vital signs may be absent or patient reported.   Wataru Mccowen E Jaslene Marsteller, LPN   Review of Systems     Cardiac Risk Factors include: advanced age (>76men, >5 women);Other (see comment), Risk factor comments: aortic atherosclerosis, CAD, h/o lung cancer     Objective:    Today's Vitals   09/18/21 0902  Weight: 124 lb (56.2 kg)   Body mass index is 20.01 kg/m.     09/18/2021    9:10 AM 02/23/2021    2:05 PM 11/11/2020    1:22 PM 09/17/2020    9:23 AM 08/14/2020    8:00 PM 08/14/2020    9:51 AM  Advanced Directives  Does Patient Have a Medical Advance Directive? Yes No No No No No  Type of Paramedic of [place];Living will       Copy of [place] in Chart? No - copy requested       Would patient like information on creating a medical advance directive?  No - Patient declined No - Patient declined No - Patient declined No - Patient declined No - Patient declined    Current Medications (verified) Outpatient Encounter Medications as  of 09/18/2021  Medication Sig   atenolol (TENORMIN) 25 MG tablet Take 1 tablet (25 mg total) by mouth daily.   Calcium Carbonate-Vit D-Min (CALCIUM 1200 PO) Take 1,200 mg by mouth daily.   carbidopa-levodopa (SINEMET IR) 25-100 MG tablet Take 2 tablets by mouth in the morning, at noon, and at bedtime.   cholecalciferol (VITAMIN D3) 25 MCG (1000 UNIT) tablet Take 1,000 Units by mouth daily.   escitalopram (LEXAPRO) 20 MG tablet Take 1 tablet (20 mg total) by mouth daily.   levocetirizine (XYZAL) 5 MG tablet Take 1 tablet (5 mg total) by mouth every evening.   rosuvastatin (CRESTOR) 5 MG tablet Take 1 tablet (5 mg total) by mouth daily.   triamcinolone (NASACORT) 55 MCG/ACT AERO nasal inhaler Place 2 sprays into the nose daily.   No facility-administered encounter medications on file as of 09/18/2021.    Allergies (verified) Sulfa antibiotics, Amoxicillin-pot clavulanate, Cefdinir, Ciprofloxacin, and Other   History: Past Medical History:  Diagnosis Date   Anxiety    Arthritis    Asthma    GERD (gastroesophageal reflux disease)    Hiatal hernia    Hyperlipidemia    Lung cancer ([place])    Osteopenia    Parkinson's syndrome ([place])    Raynaud's disease    Past Surgical History:  Procedure Laterality Date   BACK SURGERY  1995   Decompress disc lumbar   BREAST BIOPSY  1995   CATARACT EXTRACTION,  BILATERAL Bilateral 2017   COLONOSCOPY  08/20/2015   repeat in 5 years   ESOPHAGOGASTRODUODENOSCOPY  2021   LUNG REMOVAL, PARTIAL Right    REFRACTIVE SURGERY Left 08/24/2016   Family History  Problem Relation Age of Onset   Dementia Mother    Heart disease Father    Diabetes Father    Prostate cancer Father    Diabetes Brother    Acute myelogenous leukemia Brother    Parkinson's disease Neg Hx    Breast cancer Neg Hx    Social History   Socioeconomic History   Marital status: Married    Spouse name: [name]   Number of children: 1   Years of education: Not on file   Highest  education level: Not on file  Occupational History   Occupation: retired  Tobacco Use   Smoking status: Former    Packs/day: 0.25    Years: 3.00    Total pack years: 0.75    Types: Cigarettes    Quit date: 07/23/1960    Years since quitting: 61.1   Smokeless tobacco: Never  Vaping Use   Vaping Use: Never used  Substance and Sexual Activity   Alcohol use: Not Currently    Alcohol/week: 0.0 - 1.0 standard drinks of alcohol    Comment: occ   Drug use: Never   Sexual activity: Yes    Partners: Male    Birth control/protection: None  Other Topics Concern   Not on file  Social History Narrative   Moved here from [place] with husband [name] beginning of 2022. Their child lives nearby.   Social Determinants of Health   Financial Resource Strain: Low Risk  (09/18/2021)   Overall Financial Resource Strain (CARDIA)    Difficulty of Paying Living Expenses: Not hard at all  Food Insecurity: No Food Insecurity (09/18/2021)   Hunger Vital Sign    Worried About Running Out of Food in the Last Year: Never true    Ran Out of Food in the Last Year: Never true  Transportation Needs: No Transportation Needs (09/18/2021)   PRAPARE - Hydrologist (Medical): No    Lack of Transportation (Non-Medical): No  Physical Activity: Sufficiently Active (09/18/2021)   Exercise Vital Sign    Days of Exercise per Week: 5 days    Minutes of Exercise per Session: 60 min  Stress: No Stress Concern Present (09/18/2021)   [place]    Feeling of Stress : Only a little  Social Connections: Moderately Isolated (09/18/2021)   Social Connection and Isolation Panel [NHANES]    Frequency of Communication with Friends and Family: More than three times a week    Frequency of Social Gatherings with Friends and Family: Twice a week    Attends Religious Services: Never    Marine scientist or Organizations: No    Attends Arts development officer: Never    Marital Status: Married    Tobacco Counseling Counseling given: Not Answered   Clinical Intake:  Pre-visit preparation completed: Yes  Pain : No/denies pain     BMI - recorded: 20.01 Nutritional Status: BMI of 19-24  Normal Nutritional Risks: None Diabetes: No  How often do you need to have someone help you when you read instructions, pamphlets, or other written materials from your doctor or pharmacy?: 1 - Never  Diabetic? no  Interpreter Needed?: No  Information entered by :: Adalberto Cole, LPN   Activities  of Daily Living    09/18/2021    9:07 AM  In your present state of health, do you have any difficulty performing the following activities:  Hearing? 1  Comment mild  Vision? 0  Difficulty concentrating or making decisions? 0  Walking or climbing stairs? 0  Dressing or bathing? 0  Doing errands, shopping? 0  Preparing Food and eating ? N  Using the Toilet? N  In the past six months, have you accidently leaked urine? N  Do you have problems with loss of bowel control? N  Managing your Medications? N  Managing your Finances? N  Housekeeping or managing your Housekeeping? N    Patient Care Team: Loman Brooklyn, FNP as PCP - General (Family Medicine) Brien Mates, RN as Oncology Nurse Navigator (Oncology) Derek Jack, MD as Medical Oncologist (Oncology)  Indicate any recent Medical Services you may have received from other than [facility] providers in the past year (date may be approximate).     Assessment:   This is a routine wellness examination for Hadlie.  Hearing/Vision screen Hearing Screening - Comments:: C/o mild hearing difficulties - declines hearing aids Vision Screening - Comments:: Wears rx glasses - behind with routine eye exams with [facility] before she moved here - declined local optometrist referral  Dietary issues and exercise activities discussed: Current Exercise Habits: Home  exercise routine, Type of exercise: walking;treadmill, Time (Minutes): 60, Frequency (Times/Week): 5, Weekly Exercise (Minutes/Week): 300, Intensity: Mild, Exercise limited by: cardiac condition(s);respiratory conditions(s);neurologic condition(s)   Goals Addressed             This Visit's Progress    Exercise 3x per week (30 min per time)   On track    Continue using your treadmill and walking Currently using 5 days per week 1 hour am, 30 min pm       Depression Screen    09/18/2021    9:05 AM 11/04/2020    9:53 AM 09/17/2020    9:22 AM 09/04/2020   10:03 AM 07/23/2020    9:39 AM  PHQ 2/9 Scores  PHQ - 2 Score 0 0 2 2 0  PHQ- 9 Score  0 9 9 0    Fall Risk    09/18/2021    9:04 AM 11/04/2020    9:53 AM 09/17/2020    9:23 AM 09/04/2020   10:03 AM 07/23/2020    9:39 AM  Fall Risk   Falls in the past year? 0 0 0 0 0  Number falls in past yr: 0  0    Injury with Fall? 0  0    Risk for fall due to : No Fall Risks  Other (Comment)    Risk for fall due to: Comment   Parkinson's    Follow up Falls prevention discussed  Falls prevention discussed;Education provided      FALL RISK PREVENTION PERTAINING TO THE HOME:  Any stairs in or around the home? No  If so, are there any without handrails? No  Home free of loose throw rugs in walkways, pet beds, electrical cords, etc? Yes  Adequate lighting in your home to reduce risk of falls? Yes   ASSISTIVE DEVICES UTILIZED TO PREVENT FALLS:  Life alert? No  Use of a cane, walker or w/c? No  Grab bars in the bathroom? No  Shower chair or bench in shower? No  Elevated toilet seat or a handicapped toilet? No   TIMED UP AND GO:  Was the test performed?  No . Telephonic visit  Cognitive Function:        09/18/2021    9:08 AM 09/17/2020    9:26 AM  6CIT Screen  What Year? 0 points 0 points  What month? 0 points 0 points  What time? 0 points 0 points  Count back from 20 0 points 0 points  Months in reverse 0 points 0 points   Repeat phrase 0 points 0 points  Total Score 0 points 0 points    Immunizations Immunization History  Administered Date(s) Administered   Fluad Quad(high Dose 65+) 11/03/2020   Influenza Split 10/23/2019   Moderna Sars-Covid-2 Vaccination 05/12/2019, 06/09/2019, 12/29/2019   Pneumococcal Conjugate-13 04/20/2013   Pneumococcal Polysaccharide-23 12/13/2013   Td 01/27/1999   Tdap 10/03/2012   Zoster, Live 12/28/2011    TDAP status: Up to date  Flu Vaccine status: Up to date  Pneumococcal vaccine status: Up to date  Covid-19 vaccine status: Completed vaccines  Qualifies for Shingles Vaccine? Yes   Zostavax completed Yes   Shingrix Completed?: No.    Education has been provided regarding the importance of this vaccine. Patient has been advised to call insurance company to determine out of pocket expense if they have not yet received this vaccine. Advised may also receive vaccine at local pharmacy or Health Dept. Verbalized acceptance and understanding.  Screening Tests Health Maintenance  Topic Date Due   Zoster Vaccines- Shingrix (1 of 2) Never done   COVID-19 Vaccine (4 - Booster for Moderna series) 02/23/2020   INFLUENZA VACCINE  10/06/2021   DEXA SCAN  03/08/2022   COLONOSCOPY (Pts 45-72yrs Insurance coverage will need to be confirmed)  08/20/2022   TETANUS/TDAP  10/04/2022   MAMMOGRAM  03/05/2023   Pneumonia Vaccine 16+ Years old  Completed   Hepatitis C Screening  Completed   HPV VACCINES  Aged Out    Health Maintenance  Health Maintenance Due  Topic Date Due   Zoster Vaccines- Shingrix (1 of 2) Never done   COVID-19 Vaccine (4 - Booster for Moderna series) 02/23/2020    Colorectal cancer screening: Type of screening: Colonoscopy. Completed 08/20/2015. Repeat every 7 years  Mammogram status: Completed 03/04/2021. Repeat every year  Bone Density status: Completed 2021. Results reflect: Bone density results: OSTEOPENIA. Repeat every 3 years.  Lung Cancer  Screening: (Low Dose CT Chest recommended if Age 57-80 years, 30 pack-year currently smoking OR have quit w/in 15years.) does not qualify.   Additional Screening:  Hepatitis C Screening: does qualify; Completed 08/14/2020  Vision Screening: Recommended annual ophthalmology exams for early detection of glaucoma and other disorders of the eye. Is the patient up to date with their annual eye exam?  No  Who is the provider or what is the name of the office in which the patient attends annual eye exams? [facility] in [place] last in 2021 If pt is not established with a provider, would they like to be referred to a provider to establish care? No .   Dental Screening: Recommended annual dental exams for proper oral hygiene  Community Resource Referral / Chronic Care Management: CRR required this visit?  No   CCM required this visit?  No      Plan:     I have personally reviewed and noted the following in the patient's chart:   Medical and social history Use of alcohol, tobacco or illicit drugs  Current medications and supplements including opioid prescriptions.  Functional ability and status Nutritional status Physical activity Advanced directives List  of other physicians Hospitalizations, surgeries, and ER visits in previous 12 months Vitals Screenings to include cognitive, depression, and falls Referrals and appointments  In addition, I have reviewed and discussed with patient certain preventive protocols, quality metrics, and best practice recommendations. A written personalized care plan for preventive services as well as general preventive health recommendations were provided to patient.     Sandrea Hammond, LPN   10/08/2334   Nurse Notes: none

## 2021-09-19 ENCOUNTER — Other Ambulatory Visit: Payer: Self-pay | Admitting: Family Medicine

## 2021-09-19 DIAGNOSIS — I700 Atherosclerosis of aorta: Secondary | ICD-10-CM

## 2021-10-06 ENCOUNTER — Telehealth: Payer: Self-pay | Admitting: Family Medicine

## 2021-10-06 NOTE — Telephone Encounter (Signed)
Patient aware.

## 2021-10-22 ENCOUNTER — Ambulatory Visit: Payer: Medicare Other

## 2021-10-22 ENCOUNTER — Encounter: Payer: Self-pay | Admitting: Nurse Practitioner

## 2021-10-22 ENCOUNTER — Ambulatory Visit (INDEPENDENT_AMBULATORY_CARE_PROVIDER_SITE_OTHER): Payer: Medicare Other | Admitting: Nurse Practitioner

## 2021-10-22 VITALS — BP 122/77 | HR 74 | Temp 98.0°F | Resp 20 | Ht 66.0 in | Wt 130.0 lb

## 2021-10-22 DIAGNOSIS — W57XXXA Bitten or stung by nonvenomous insect and other nonvenomous arthropods, initial encounter: Secondary | ICD-10-CM | POA: Diagnosis not present

## 2021-10-22 DIAGNOSIS — I2510 Atherosclerotic heart disease of native coronary artery without angina pectoris: Secondary | ICD-10-CM | POA: Diagnosis not present

## 2021-10-22 DIAGNOSIS — S80861A Insect bite (nonvenomous), right lower leg, initial encounter: Secondary | ICD-10-CM | POA: Diagnosis not present

## 2021-10-22 MED ORDER — PREDNISONE 20 MG PO TABS: 40.0000 mg | ORAL_TABLET | Freq: Every day | ORAL | 0 refills | Status: AC

## 2021-10-22 NOTE — Progress Notes (Signed)
   Subjective:    Patient ID: Deborah [name]. [place], female    DOB: 1945/08/23, 76 y.o.   MRN: [id number]   Chief Complaint: Bites on body   HPI Patient I c/o of multiple bug bites. Are scattered all over. Not sure what is biting her. They are sore to touch. Has a new bite everyday. They itch and burn. She has checked her bed for bed bugs. Denies fleas in house   Review of Systems  Constitutional:  Negative for diaphoresis.  Eyes:  Negative for pain.  Respiratory:  Negative for shortness of breath.   Cardiovascular:  Negative for chest pain, palpitations and leg swelling.  Gastrointestinal:  Negative for abdominal pain.  Endocrine: Negative for polydipsia.  Skin:  Negative for rash.  Neurological:  Negative for dizziness, weakness and headaches.  Hematological:  Does not bruise/bleed easily.  All other systems reviewed and are negative.      Objective:   Physical Exam Constitutional:      Appearance: Normal appearance.  Cardiovascular:     Rate and Rhythm: Normal rate and regular rhythm.     Heart sounds: Normal heart sounds.  Pulmonary:     Effort: Pulmonary effort is normal.     Breath sounds: Normal breath sounds.  Skin:    General: Skin is warm.     Comments: Erythematous maculopapular lesion in scattered places all overbody.  Neurological:     General: No focal deficit present.     Mental Status: She is alert and oriented to person, place, and time.  Psychiatric:        Mood and Affect: Mood normal.        Behavior: Behavior normal.   BP 122/77   Pulse 74   Temp 98 F (36.7 C) (Oral)   Resp 20   Ht 5\' 6"  (1.676 m)   Wt 130 lb (59 kg)   SpO2 98%   BMI 20.98 kg/m          Assessment & Plan:   [name]. [name] in today with chief complaint of Bites on body   1. Bug bite, initial encounter Need to figure out what is biting her Avoid scratching Benadryl as needed for itching - predniSONE (DELTASONE) 20 MG tablet; Take 2 tablets (40 mg total) by mouth  daily with breakfast for 5 days. 2 po daily for 5 days  Dispense: 10 tablet; Refill: 0    The above assessment and management plan was discussed with the patient. The patient verbalized understanding of and has agreed to the management plan. Patient is aware to call the clinic if symptoms persist or worsen. Patient is aware when to return to the clinic for a follow-up visit. Patient educated on when it is appropriate to go to the emergency department.   [name] Done, FNP

## 2021-10-22 NOTE — Patient Instructions (Signed)
Insect Bite, Adult An insect bite can make your skin red, itchy, and swollen. Some insects can spread disease to people with a bite. However, most insect bites do not lead to disease, and most are not serious. What are the causes? Insects may bite for many reasons, including: Hunger. To defend themselves. Insects that bite include: Spiders. Mosquitoes. Flies. Ticks and fleas. Ants. Kissing bugs. Chiggers. What are the signs or symptoms? Symptoms often last for 2-4 days. However, itching can last up to 10 days. Symptoms include: Itching or pain in the bite area. Redness and swelling in the bite area. An open wound. In rare cases, a person may have a very bad allergic reaction (anaphylactic reaction) to a bite. Symptoms of an anaphylactic reaction may include: Feeling warm in the face (flushed). Your face may turn red. Itchy, red, swollen areas of skin (hives). Swelling of the eyes, lips, face, mouth, tongue, or throat. Trouble with breathing, talking, or swallowing. High-pitched whistling sounds, most often when breathing out (wheezing). Feeling dizzy or light-headed. Fainting. Pain or cramps in your belly (abdomen). Vomiting. Watery poop (diarrhea). How is this treated? Most insect bites are not serious. Symptoms often go away on their own. When treatment is advised, it may include: Putting ice on the bite area. Putting a cream or lotion, like calamine lotion, on the bite area. This helps with itching. Using medicines called antihistamines. You may also need: A tetanus shot if you are not up to date. An antibiotic cream or medicine. This treatment is needed if the bite area gets infected. Follow these instructions at home: Bite area care  Do not scratch the bite area. It may help to cover the bite area with a bandage or close-fitting clothing. Keep the bite area clean and dry. Check the bite area every day for signs of infection. Check for: More redness, swelling, or  pain. Fluid or blood. Warmth. Pus or a bad smell. Wash your hands often. Managing pain, itching, and swelling  You may put any of these on the bite area as told by your doctor: A paste made of baking soda and water. Cortisone cream. Calamine lotion. If told, put ice on the bite area. To do this: Put ice in a plastic bag. Place a towel between your skin and the bag. Leave the ice on for 20 minutes, 2-3 times a day. If your skin turns bright red, take off the ice right away to prevent skin damage. The risk of skin damage is higher if you cannot feel pain, heat, or cold. General instructions Apply or take over-the-counter and prescription medicines only as told by your doctor. If you were prescribed antibiotics, take or apply them as told by your doctor. Do not stop using them even if you start to feel better. How is this prevented? To help you have a lower risk of insect bites: When you are outside, wear clothes that cover your arms and legs. Use insect repellent. The best insect repellents contain one of these: DEET. Picaridin. Oil of lemon eucalyptus (OLE). IR3535. Consider spraying your clothing with a pesticide called permethrin. Permethrin helps prevent insect bites. It works for several weeks and for up to 5-6 clothing washes. Do not apply permethrin directly to the skin. If your home windows do not have screens, think about putting some in. If you will be sleeping in an area where there are mosquitoes, consider covering your sleeping area with a mosquito net. Contact a doctor if: You have redness, swelling, or pain   in the bite area. You have fluid or blood coming from the bite area. The bite area feels warm to the touch. You have pus or a bad smell coming from the bite area. You have a fever. Get help right away if: You have joint pain. You have a rash. You feel weak or more tired than you normally do. You have neck pain or a headache. You have signs of an anaphylactic  reaction. Signs may include: Swelling of your eyes, lips, face, mouth, tongue, or throat. Feeling warm in the face. Itchy, red, swollen areas of skin. Trouble with breathing, talking, or swallowing. Wheezing. Feeling dizzy or light-headed. Fainting. Pain or cramps in your belly. Vomiting or watery poop. These symptoms may be an emergency. Get help right away. Call 911. Do not wait to see if symptoms will go away. Do not drive yourself to the hospital. Summary An insect bite can make your skin red, itchy, and swollen. Treatment is usually not needed. Symptoms often go away on their own. Do not scratch the bite area. Keep it clean and dry. Use insect repellent to help prevent insect bites. Contact a doctor if you have signs of infection. This information is not intended to replace advice given to you by your health care provider. Make sure you discuss any questions you have with your health care provider. Document Revised: 05/19/2021 Document Reviewed: 05/19/2021 Elsevier Patient Education  2023 [employer]  

## 2021-10-30 ENCOUNTER — Encounter: Payer: Self-pay | Admitting: Family Medicine

## 2021-10-30 ENCOUNTER — Ambulatory Visit (INDEPENDENT_AMBULATORY_CARE_PROVIDER_SITE_OTHER): Payer: Medicare Other | Admitting: Family Medicine

## 2021-10-30 VITALS — BP 119/74 | HR 71 | Temp 97.6°F | Ht 66.0 in | Wt 126.8 lb

## 2021-10-30 DIAGNOSIS — M549 Dorsalgia, unspecified: Secondary | ICD-10-CM

## 2021-10-30 MED ORDER — METHOCARBAMOL 500 MG PO TABS: 500.0000 mg | ORAL_TABLET | Freq: Three times a day (TID) | ORAL | 0 refills | Status: DC | PRN

## 2021-10-30 NOTE — Progress Notes (Signed)
Assessment & Plan:  1. Musculoskeletal back pain Education provided on low back sprain/strain rehab. Continue Tylenol, exercises, and the tootsie pillow. Encouraged to add heating pad, muscle rub, and muscle relaxer.  - methocarbamol (ROBAXIN) 500 MG tablet; Take 1 tablet (500 mg total) by mouth every 8 (eight) hours as needed for muscle spasms.  Dispense: 60 tablet; Refill: 0   Follow up plan: Return for follow-up of chronic medication conditions with T. Lilia Pro.  Hendricks Limes, MSN, APRN, FNP-C [facility]  Subjective:   Patient ID: Deborah [name], female    DOB: 1945/05/26, 76 y.o.   MRN: [id number]  HPI: Deborah [name] is a 76 y.o. female presenting on 10/30/2021 for Back Pain (Left lower back pain x 3 weeks but got worse last Sunday.)  Back Pain  She reports new onset back pain. There was not an injury that may have caused the pain. The most recent episode started  3 weeks ago and is gradually worsening. The pain is located in the left lumbar area with radiation down the left leg. It is described as a twinge initially changing to stabbing, is 7/10 in intensity, occurring constantly. Symptoms worse with activity. Relieving factors: sitting. She has tried acetaminophen, a tootsie roll pillow, and physical thearpy with significant relief. She reports a history of DDD with bulging disc back in 1995.   Associated symptoms: No abdominal pain No bowel incontinence  No chest pain No dysuria   No fever No headaches  No joint pains No pelvic pain  No weakness in leg  No tingling in lower extremities  No urinary incontinence No weight loss  -----------------------------------------------------------------------------------------   ROS: Negative unless specifically indicated above in HPI.   Relevant past medical history reviewed and updated as indicated.   Allergies and medications reviewed and updated.   Current Outpatient Medications:    atenolol  (TENORMIN) 25 MG tablet, Take 1 tablet (25 mg total) by mouth daily., Disp: 90 tablet, Rfl: 3   Calcium Carbonate-Vit D-Min (CALCIUM 1200 PO), Take 1,200 mg by mouth daily., Disp: , Rfl:    carbidopa-levodopa (SINEMET IR) 25-100 MG tablet, Take 2 tablets by mouth in the morning, at noon, and at bedtime., Disp: 540 tablet, Rfl: 3   cetirizine (ZYRTEC) 10 MG tablet, Take 10 mg by mouth daily., Disp: , Rfl:    cholecalciferol (VITAMIN D3) 25 MCG (1000 UNIT) tablet, Take 1,000 Units by mouth daily., Disp: , Rfl:    escitalopram (LEXAPRO) 20 MG tablet, Take 1 tablet (20 mg total) by mouth daily., Disp: 90 tablet, Rfl: 3   levocetirizine (XYZAL) 5 MG tablet, Take 1 tablet (5 mg total) by mouth every evening., Disp: 90 tablet, Rfl: 3   rosuvastatin (CRESTOR) 5 MG tablet, TAKE 1 TABLET (5 MG TOTAL) BY MOUTH DAILY., Disp: 90 tablet, Rfl: 3   triamcinolone (NASACORT) 55 MCG/ACT AERO nasal inhaler, Place 2 sprays into the nose daily., Disp: , Rfl:   Allergies  Allergen Reactions   Sulfa Antibiotics Hives and Itching   Amoxicillin-Pot Clavulanate Nausea And Vomiting   Cefdinir Nausea And Vomiting and Diarrhea   Ciprofloxacin Hives and Itching   Clarithromycin Hives and Itching   Other     Bioxin    Penicillins     Objective:   BP 119/74   Pulse 71   Temp 97.6 F (36.4 C) (Temporal)   Ht 5\' 6"  (1.676 m)   Wt 126 lb 12.8 oz (57.5 kg)   SpO2 98%  BMI 20.47 kg/m    Physical Exam Vitals reviewed.  Constitutional:      General: She is not in acute distress.    Appearance: Normal appearance. She is not ill-appearing, toxic-appearing or diaphoretic.  HENT:     Head: Normocephalic and atraumatic.  Eyes:     General: No scleral icterus.       Right eye: No discharge.        Left eye: No discharge.     Conjunctiva/sclera: Conjunctivae normal.  Cardiovascular:     Rate and Rhythm: Normal rate.  Pulmonary:     Effort: Pulmonary effort is normal. No respiratory distress.  Musculoskeletal:         General: Normal range of motion.     Cervical back: Normal range of motion.     Lumbar back: Tenderness (muscular on left side) present.  Skin:    General: Skin is warm and dry.     Capillary Refill: Capillary refill takes less than 2 seconds.  Neurological:     General: No focal deficit present.     Mental Status: She is alert and oriented to person, place, and time. Mental status is at baseline.  Psychiatric:        Mood and Affect: Mood normal.        Behavior: Behavior normal.        Thought Content: Thought content normal.        Judgment: Judgment normal.

## 2021-11-24 ENCOUNTER — Ambulatory Visit: Payer: Medicare Other | Admitting: Nurse Practitioner

## 2021-11-30 ENCOUNTER — Ambulatory Visit (INDEPENDENT_AMBULATORY_CARE_PROVIDER_SITE_OTHER): Payer: Medicare Other | Admitting: Family Medicine

## 2021-11-30 ENCOUNTER — Encounter: Payer: Self-pay | Admitting: Family Medicine

## 2021-11-30 VITALS — BP 110/63 | HR 60 | Temp 98.3°F | Ht 66.0 in | Wt 128.0 lb

## 2021-11-30 DIAGNOSIS — K219 Gastro-esophageal reflux disease without esophagitis: Secondary | ICD-10-CM

## 2021-11-30 DIAGNOSIS — G20 Parkinson's disease: Secondary | ICD-10-CM

## 2021-11-30 DIAGNOSIS — F411 Generalized anxiety disorder: Secondary | ICD-10-CM

## 2021-11-30 DIAGNOSIS — R000 Tachycardia, unspecified: Secondary | ICD-10-CM

## 2021-11-30 DIAGNOSIS — M8580 Other specified disorders of bone density and structure, unspecified site: Secondary | ICD-10-CM

## 2021-11-30 DIAGNOSIS — I700 Atherosclerosis of aorta: Secondary | ICD-10-CM

## 2021-11-30 DIAGNOSIS — J302 Other seasonal allergic rhinitis: Secondary | ICD-10-CM | POA: Diagnosis not present

## 2021-11-30 NOTE — Progress Notes (Signed)
Established Patient Office Visit  Subjective   Patient ID: Deborah [name]. Cruzen, female    DOB: June 19, 1945  Age: 76 y.o. MRN: [id number]  Chief Complaint  Patient presents with   Medical Management of Chronic Issues    HPI  Jeannene Patella is here for a chronic follow up and lab work. She is fasting this morning. She reports doing well and has no concerns today. She is taking lexapro for anxiety and this is controlling her symptoms well with out side effects. She was diagnosed with Parkinson's in 2021 and is doing well on Sinemet. She has established with neurology here in [place] but has not follow up in the last year with them. She takes atenolol daily for tachycardia. She takes nasacort and xyzal daily for chronic allergies. She does continue to have some mild symptoms that she manages without difficulty. She takes a statin daily. She is on a daily vitamin D/calcium supplement for osteopenia. She currently makes GERD with diet and denies any symptoms.      11/30/2021    8:10 AM 10/30/2021    8:26 AM 09/18/2021    9:05 AM  Depression screen PHQ 2/9  Decreased Interest 0 0 0  Down, Depressed, Hopeless 0 0 0  PHQ - 2 Score 0 0 0  Altered sleeping 0 0   Tired, decreased energy 0 0   Change in appetite 0 0   Feeling bad or failure about yourself  0 0   Trouble concentrating 0 0   Moving slowly or fidgety/restless 0 0   Suicidal thoughts 0 0   PHQ-9 Score 0 0   Difficult doing work/chores Not difficult at all Not difficult at all       11/30/2021    8:11 AM 10/30/2021    8:26 AM 11/04/2020    9:54 AM 09/04/2020   10:04 AM  GAD 7 : Generalized Anxiety Score  Nervous, Anxious, on Edge 0 0 0 1  Control/stop worrying 0 0 0 1  Worry too much - different things 0 0 0 1  Trouble relaxing 0 0 0 1  Restless 0 0 0 1  Easily annoyed or irritable 0 0 0 1  Afraid - awful might happen 0 0 0 1  Total GAD 7 Score 0 0 0 7  Anxiety Difficulty Not difficult at all Not difficult at all Not difficult at all Somewhat  difficult       ROS Negative unless specially indicated above in HPI.   Objective:     BP 110/63   Pulse 60   Temp 98.3 F (36.8 C) (Temporal)   Ht '5\' 6"'  (1.676 m)   Wt 128 lb (58.1 kg)   SpO2 99%   BMI 20.66 kg/m    Physical Exam Vitals and nursing note reviewed.  Constitutional:      General: She is not in acute distress.    Appearance: She is not ill-appearing, toxic-appearing or diaphoretic.  HENT:     Head: Normocephalic and atraumatic.     Right Ear: Tympanic membrane, ear canal and external ear normal.     Left Ear: Tympanic membrane, ear canal and external ear normal.     Nose: Nose normal.     Mouth/Throat:     Mouth: Mucous membranes are moist.     Pharynx: Oropharynx is clear. No oropharyngeal exudate or posterior oropharyngeal erythema.  Eyes:     Conjunctiva/sclera: Conjunctivae normal.     Pupils: Pupils are equal, round, and reactive to  light.  Neck:     Thyroid: No thyroid mass, thyromegaly or thyroid tenderness.  Cardiovascular:     Rate and Rhythm: Normal rate and regular rhythm.     Heart sounds: Normal heart sounds. No murmur heard. Pulmonary:     Effort: Pulmonary effort is normal. No respiratory distress.     Breath sounds: Normal breath sounds. No wheezing or rales.  Abdominal:     General: Bowel sounds are normal. There is no distension.     Palpations: Abdomen is soft.     Tenderness: There is no abdominal tenderness. There is no guarding or rebound.  Musculoskeletal:     Cervical back: Neck supple.     Right lower leg: No edema.     Left lower leg: No edema.  Skin:    General: Skin is warm and dry.  Neurological:     General: No focal deficit present.     Mental Status: She is alert and oriented to person, place, and time.  Psychiatric:        Mood and Affect: Mood normal.        Behavior: Behavior normal.        Thought Content: Thought content normal.        Judgment: Judgment normal.      No results found for any visits  on 11/30/21.    The 10-year ASCVD risk score (Arnett DK, et al., 2019) is: 10.7%    Assessment & Plan:   Twisha was seen today for medical management of chronic issues.  Diagnoses and all orders for this visit:  Generalized anxiety disorder Well controlled on current regimen. Continue lexapro.  -     CMP14+EGFR -     Lipid panel -     TSH  Parkinsonism, unspecified Parkinsonism type ([place]) Well controlled on current regimen. Continue sinemet. Is established with neurology.   Tachycardia Well controlled on current regimen. Continue atenolol.   Seasonal allergies Well controlled on current regimen. Continue xyzal and nasacort.   Osteopenia, unspecified location Continue vitamin D and calcium supplement.   Aortic atherosclerosis ([place]) On crestor. Labs pending.  -     CMP14+EGFR -     CBC with Differential/Platelet -     Lipid panel  Gastroesophageal reflux disease without esophagitis Well controlled with diet. Labs pending.  -     CMP14+EGFR -     CBC with Differential/Platelet -     Lipid panel -     TSH  Return in about 1 year (around 12/01/2022) for CPE.   The patient indicates understanding of these issues and agrees with the plan.  [name], FNP

## 2021-11-30 NOTE — Patient Instructions (Signed)

## 2021-12-01 LAB — TSH: TSH: 1.54 u[IU]/mL (ref 0.450–4.500)

## 2021-12-01 LAB — CBC WITH DIFFERENTIAL/PLATELET
Basophils Absolute: 0 10*3/uL (ref 0.0–0.2)
Basos: 1 %
EOS (ABSOLUTE): 0.2 10*3/uL (ref 0.0–0.4)
Eos: 3 %
Hematocrit: 42.4 % (ref 34.0–46.6)
Hemoglobin: 13.6 g/dL (ref 11.1–15.9)
Immature Grans (Abs): 0 10*3/uL (ref 0.0–0.1)
Immature Granulocytes: 0 %
Lymphocytes Absolute: 1.4 10*3/uL (ref 0.7–3.1)
Lymphs: 22 %
MCH: 30.1 pg (ref 26.6–33.0)
MCHC: 32.1 g/dL (ref 31.5–35.7)
MCV: 94 fL (ref 79–97)
Monocytes Absolute: 0.6 10*3/uL (ref 0.1–0.9)
Monocytes: 9 %
Neutrophils Absolute: 4.2 10*3/uL (ref 1.4–7.0)
Neutrophils: 65 %
Platelets: 300 10*3/uL (ref 150–450)
RBC: 4.52 x10E6/uL (ref 3.77–5.28)
RDW: 12.7 % (ref 11.7–15.4)
WBC: 6.5 10*3/uL (ref 3.4–10.8)

## 2021-12-01 LAB — CMP14+EGFR
ALT: 7 IU/L (ref 0–32)
AST: 31 IU/L (ref 0–40)
Albumin/Globulin Ratio: 2.6 — ABNORMAL HIGH (ref 1.2–2.2)
Albumin: 4.9 g/dL — ABNORMAL HIGH (ref 3.8–4.8)
Alkaline Phosphatase: 64 IU/L (ref 44–121)
BUN/Creatinine Ratio: 17 (ref 12–28)
BUN: 15 mg/dL (ref 8–27)
Bilirubin Total: 0.4 mg/dL (ref 0.0–1.2)
CO2: 23 mmol/L (ref 20–29)
Calcium: 9.9 mg/dL (ref 8.7–10.3)
Chloride: 103 mmol/L (ref 96–106)
Creatinine, Ser: 0.88 mg/dL (ref 0.57–1.00)
Globulin, Total: 1.9 g/dL (ref 1.5–4.5)
Glucose: 89 mg/dL (ref 70–99)
Potassium: 4.7 mmol/L (ref 3.5–5.2)
Sodium: 142 mmol/L (ref 134–144)
Total Protein: 6.8 g/dL (ref 6.0–8.5)
eGFR: 69 mL/min/{1.73_m2} (ref >59)

## 2021-12-01 LAB — LIPID PANEL
Chol/HDL Ratio: 2.3 ratio (ref 0.0–4.4)
Cholesterol, Total: 141 mg/dL (ref 100–199)
HDL: 62 mg/dL (ref >39)
LDL Chol Calc (NIH): 62 mg/dL (ref 0–99)
Triglycerides: 90 mg/dL (ref 0–149)
VLDL Cholesterol Cal: 17 mg/dL (ref 5–40)

## 2022-02-08 ENCOUNTER — Other Ambulatory Visit (HOSPITAL_COMMUNITY): Payer: Self-pay | Admitting: Hematology

## 2022-02-08 DIAGNOSIS — C3491 Malignant neoplasm of unspecified part of right bronchus or lung: Secondary | ICD-10-CM

## 2022-02-15 ENCOUNTER — Ambulatory Visit (HOSPITAL_COMMUNITY): Payer: PRIVATE HEALTH INSURANCE

## 2022-02-15 ENCOUNTER — Other Ambulatory Visit: Payer: PRIVATE HEALTH INSURANCE

## 2022-02-22 ENCOUNTER — Ambulatory Visit: Payer: PRIVATE HEALTH INSURANCE | Admitting: Hematology

## 2022-03-11 ENCOUNTER — Other Ambulatory Visit (HOSPITAL_COMMUNITY): Payer: PRIVATE HEALTH INSURANCE

## 2022-03-11 ENCOUNTER — Inpatient Hospital Stay (HOSPITAL_COMMUNITY): Payer: Medicare Other | Attending: Hematology

## 2022-03-11 ENCOUNTER — Ambulatory Visit (HOSPITAL_COMMUNITY)
Admission: RE | Admit: 2022-03-11 | Discharge: 2022-03-11 | Disposition: A | Discharge status: Home / Self Care | Payer: Medicare Other | Source: Ambulatory Visit | Attending: Hematology | Admitting: Hematology

## 2022-03-11 ENCOUNTER — Other Ambulatory Visit: Payer: Self-pay

## 2022-03-11 DIAGNOSIS — C3491 Malignant neoplasm of unspecified part of right bronchus or lung: Secondary | ICD-10-CM | POA: Insufficient documentation

## 2022-03-11 LAB — CBC WITH DIFFERENTIAL/PLATELET
Abs Immature Granulocytes: 0.03 10*3/uL (ref 0.00–0.07)
Basophils Absolute: 0.1 10*3/uL (ref 0.0–0.1)
Basophils Relative: 1 %
Eosinophils Absolute: 0.2 10*3/uL (ref 0.0–0.5)
Eosinophils Relative: 2 %
HCT: 39.2 % (ref 36.0–46.0)
Hemoglobin: 12.8 g/dL (ref 12.0–15.0)
Immature Granulocytes: 0 %
Lymphocytes Relative: 23 %
Lymphs Abs: 2 10*3/uL (ref 0.7–4.0)
MCH: 31.1 pg (ref 26.0–34.0)
MCHC: 32.7 g/dL (ref 30.0–36.0)
MCV: 95.4 fL (ref 80.0–100.0)
Monocytes Absolute: 0.8 10*3/uL (ref 0.1–1.0)
Monocytes Relative: 9 %
Neutro Abs: 5.8 10*3/uL (ref 1.7–7.7)
Neutrophils Relative %: 65 %
Platelets: 312 10*3/uL (ref 150–400)
RBC: 4.11 MIL/uL (ref 3.87–5.11)
RDW: 14.2 % (ref 11.5–15.5)
WBC: 8.9 10*3/uL (ref 4.0–10.5)
nRBC: 0 % (ref 0.0–0.2)

## 2022-03-11 LAB — COMPREHENSIVE METABOLIC PANEL
ALT: 12 U/L (ref 0–44)
AST: 32 U/L (ref 15–41)
Albumin: 4.1 g/dL (ref 3.5–5.0)
Alkaline Phosphatase: 55 U/L (ref 38–126)
Anion gap: 7 (ref 5–15)
BUN: 22 mg/dL (ref 8–23)
CO2: 29 mmol/L (ref 22–32)
Calcium: 9.3 mg/dL (ref 8.9–10.3)
Chloride: 98 mmol/L (ref 98–111)
Creatinine, Ser: 0.76 mg/dL (ref 0.44–1.00)
GFR, Estimated: >60 mL/min (ref >60)
Glucose, Bld: 95 mg/dL (ref 70–99)
Potassium: 3.6 mmol/L (ref 3.5–5.1)
Sodium: 134 mmol/L — ABNORMAL LOW (ref 135–145)
Total Bilirubin: 0.9 mg/dL (ref 0.3–1.2)
Total Protein: 7.2 g/dL (ref 6.5–8.1)

## 2022-03-18 ENCOUNTER — Ambulatory Visit: Payer: PRIVATE HEALTH INSURANCE | Admitting: Hematology

## 2022-03-23 ENCOUNTER — Inpatient Hospital Stay: Payer: Medicare Other | Attending: Hematology | Admitting: Hematology

## 2022-03-23 VITALS — BP 147/69 | HR 71 | Temp 97.6°F | Resp 18 | Ht 66.0 in | Wt 134.2 lb

## 2022-03-23 DIAGNOSIS — Z902 Acquired absence of lung [part of]: Secondary | ICD-10-CM | POA: Diagnosis not present

## 2022-03-23 DIAGNOSIS — Z833 Family history of diabetes mellitus: Secondary | ICD-10-CM | POA: Insufficient documentation

## 2022-03-23 DIAGNOSIS — Z8042 Family history of malignant neoplasm of prostate: Secondary | ICD-10-CM | POA: Diagnosis not present

## 2022-03-23 DIAGNOSIS — C3431 Malignant neoplasm of lower lobe, right bronchus or lung: Secondary | ICD-10-CM | POA: Insufficient documentation

## 2022-03-23 DIAGNOSIS — M8580 Other specified disorders of bone density and structure, unspecified site: Secondary | ICD-10-CM | POA: Insufficient documentation

## 2022-03-23 DIAGNOSIS — Z79899 Other long term (current) drug therapy: Secondary | ICD-10-CM | POA: Diagnosis not present

## 2022-03-23 DIAGNOSIS — C3491 Malignant neoplasm of unspecified part of right bronchus or lung: Secondary | ICD-10-CM

## 2022-03-23 DIAGNOSIS — G20A1 Parkinson's disease without dyskinesia, without mention of fluctuations: Secondary | ICD-10-CM | POA: Diagnosis not present

## 2022-03-23 DIAGNOSIS — Z8249 Family history of ischemic heart disease and other diseases of the circulatory system: Secondary | ICD-10-CM | POA: Insufficient documentation

## 2022-03-23 DIAGNOSIS — Z87891 Personal history of nicotine dependence: Secondary | ICD-10-CM | POA: Diagnosis not present

## 2022-03-23 DIAGNOSIS — Z818 Family history of other mental and behavioral disorders: Secondary | ICD-10-CM | POA: Insufficient documentation

## 2022-03-23 DIAGNOSIS — Z882 Allergy status to sulfonamides status: Secondary | ICD-10-CM | POA: Insufficient documentation

## 2022-03-23 DIAGNOSIS — Z881 Allergy status to other antibiotic agents status: Secondary | ICD-10-CM | POA: Diagnosis not present

## 2022-03-23 DIAGNOSIS — Z880 Allergy status to penicillin: Secondary | ICD-10-CM | POA: Insufficient documentation

## 2022-03-23 DIAGNOSIS — I700 Atherosclerosis of aorta: Secondary | ICD-10-CM | POA: Diagnosis not present

## 2022-03-23 NOTE — Patient Instructions (Addendum)
[facility]  Discharge Instructions  You were seen and examined today by Dr. Ellin Saba.  Your CT scan is stable - no signs of cancer recurrence.  You will have a repeat CT scan in one year.  Follow-up as scheduled.  Thank you for choosing [facility] to provide your oncology and hematology care.   To afford each patient quality time with our provider, please arrive at least 15 minutes before your scheduled appointment time. You may need to reschedule your appointment if you arrive late (10 or more minutes). Arriving late affects you and other patients whose appointments are after yours.  Also, if you miss three or more appointments without notifying the office, you may be dismissed from the clinic at the provider's discretion.    Again, thank you for choosing [facility].  Our hope is that these requests will decrease the amount of time that you wait before being seen by our physicians.   If you have a lab appointment with the Cancer Center please come in thru the Main Entrance and check in at the main information desk.           _____________________________________________________________  Should you have questions after your visit to [facility], please contact our office at [phone] and follow the prompts.  Our office hours are 8:00 a.m. to 4:30 p.m. Monday - Thursday and 8:00 a.m. to 2:30 p.m. Friday.  Please note that voicemails left after 4:00 p.m. may not be returned until the following business day.  We are closed weekends and all major holidays.  You do have access to a nurse 24-7, just call the main number to the clinic [phone] and do not press any options, hold on the line and a nurse will answer the phone.    For prescription refill requests, have your pharmacy contact our office and allow 72 hours.    Masks are optional in the cancer centers. If you would like for your care team to  wear a mask while they are taking care of you, please let them know. You may have one support person who is at least 76 years old accompany you for your appointments.

## 2022-03-23 NOTE — Progress Notes (Signed)
[facility] [address], [place] [address]   CLINIC:  Medical Oncology/Hematology  PCP:  Gabriel Earing, FNP [address] [place] [address] [phone]   REASON FOR VISIT:  Follow-up for lung cancer  PRIOR THERAPY: Right lower lobectomy in March 2020  CURRENT THERAPY: surveillance  BRIEF ONCOLOGIC HISTORY:  Oncology History  Adenocarcinoma of right lung (HCC)  11/09/2020 Initial Diagnosis   Adenocarcinoma of right lung (HCC)   11/09/2020 Cancer Staging   Staging form: Lung, AJCC 8th Edition - Clinical stage from 11/09/2020: ycT1, cN0, cM0 - Signed by Doreatha Massed, MD on 11/09/2020 Stage prefix: Post-therapy Response to neoadjuvant therapy: Complete response     CANCER STAGING:  Cancer Staging  Adenocarcinoma of right lung [facility]) Staging form: Lung, AJCC 8th Edition - Clinical stage from 11/09/2020: ycT1, cN0, cM0 - Signed by Doreatha Massed, MD on 11/09/2020   INTERVAL HISTORY:  Ms. Chimamanda Siegfried. Butz, a 77 y.o. female, seen for follow-up of right lung cancer.  Denies any chest pains, hemoptysis.  Denies any fevers, night sweats or weight loss.  Reports energy levels of 100%.    REVIEW OF SYSTEMS:  Review of Systems  All other systems reviewed and are negative.   PAST MEDICAL/SURGICAL HISTORY:  Past Medical History:  Diagnosis Date   Anxiety    Arthritis    Asthma    GERD (gastroesophageal reflux disease)    Hiatal hernia    Hyperlipidemia    Lung cancer (HCC)    Osteopenia    Parkinson's syndrome (HCC)    Raynaud's disease    Past Surgical History:  Procedure Laterality Date   BACK SURGERY  1995   Decompress disc lumbar   BREAST BIOPSY  1995   CATARACT EXTRACTION, BILATERAL Bilateral 2017   COLONOSCOPY  08/20/2015   repeat in 5 years   ESOPHAGOGASTRODUODENOSCOPY  2021   LUNG REMOVAL, PARTIAL Right    REFRACTIVE SURGERY Left 08/24/2016    SOCIAL HISTORY:  Social History   Socioeconomic History   Marital  status: Married    Spouse name: Riley Lam   Number of children: 1   Years of education: Not on file   Highest education level: Not on file  Occupational History   Occupation: retired  Tobacco Use   Smoking status: Former    Packs/day: 0.25    Years: 3.00    Total pack years: 0.75    Types: Cigarettes    Quit date: 07/23/1960    Years since quitting: 61.7   Smokeless tobacco: Never  Vaping Use   Vaping Use: Never used  Substance and Sexual Activity   Alcohol use: Not Currently    Alcohol/week: 0.0 - 1.0 standard drinks of alcohol    Comment: occ   Drug use: Never   Sexual activity: Yes    Partners: Male    Birth control/protection: None  Other Topics Concern   Not on file  Social History Narrative   Moved here from [place] with husband Riley Lam beginning of 2022. Their child lives nearby.   Social Determinants of Health   Financial Resource Strain: Low Risk  (09/18/2021)   Overall Financial Resource Strain (CARDIA)    Difficulty of Paying Living Expenses: Not hard at all  Food Insecurity: No Food Insecurity (09/18/2021)   Hunger Vital Sign    Worried About Running Out of Food in the Last Year: Never true    Ran Out of Food in the Last Year: Never true  Transportation Needs: No Transportation Needs (09/18/2021)   PRAPARE - Administrator, Civil Service (Medical): No    Lack of Transportation (Non-Medical): No  Physical Activity: Sufficiently Active (09/18/2021)   Exercise Vital Sign    Days of Exercise per Week: 5 days    Minutes of Exercise per Session: 60 min  Stress: No Stress Concern Present (09/18/2021)   [employer] of Occupational Health - Occupational Stress Questionnaire    Feeling of Stress : Only a little  Social Connections: Moderately Isolated (09/18/2021)   Social Connection and Isolation Panel [NHANES]    Frequency of Communication with Friends and Family: More than three times a week    Frequency of Social Gatherings with Friends and Family:  Twice a week    Attends Religious Services: Never    Database administrator or Organizations: No    Attends Banker Meetings: Never    Marital Status: Married  Catering manager Violence: Not At Risk (09/18/2021)   Humiliation, Afraid, Rape, and Kick questionnaire    Fear of Current or Ex-Partner: No    Emotionally Abused: No    Physically Abused: No    Sexually Abused: No    FAMILY HISTORY:  Family History  Problem Relation Age of Onset   Dementia Mother    Heart disease Father    Diabetes Father    Prostate cancer Father    Diabetes Brother    Acute myelogenous leukemia Brother    Parkinson's disease Neg Hx    Breast cancer Neg Hx     CURRENT MEDICATIONS:  Current Outpatient Medications  Medication Sig Dispense Refill   atenolol (TENORMIN) 25 MG tablet Take 1 tablet (25 mg total) by mouth daily. 90 tablet 3   Calcium Carbonate-Vit D-Min (CALCIUM 1200 PO) Take 1,200 mg by mouth daily.     carbidopa-levodopa (SINEMET IR) 25-100 MG tablet Take 2 tablets by mouth in the morning, at noon, and at bedtime. 540 tablet 3   cetirizine (ZYRTEC) 10 MG tablet Take 10 mg by mouth daily.     cholecalciferol (VITAMIN D3) 25 MCG (1000 UNIT) tablet Take 1,000 Units by mouth daily.     escitalopram (LEXAPRO) 20 MG tablet Take 1 tablet (20 mg total) by mouth daily. 90 tablet 3   levocetirizine (XYZAL) 5 MG tablet Take 1 tablet (5 mg total) by mouth every evening. 90 tablet 3   methocarbamol (ROBAXIN) 500 MG tablet Take 1 tablet (500 mg total) by mouth every 8 (eight) hours as needed for muscle spasms. (Patient not taking: Reported on 11/30/2021) 60 tablet 0   rosuvastatin (CRESTOR) 5 MG tablet TAKE 1 TABLET (5 MG TOTAL) BY MOUTH DAILY. 90 tablet 3   triamcinolone (NASACORT) 55 MCG/ACT AERO nasal inhaler Place 2 sprays into the nose daily.     No current facility-administered medications for this visit.    ALLERGIES:  Allergies  Allergen Reactions   Sulfa Antibiotics Hives and  Itching   Amoxicillin-Pot Clavulanate Nausea And Vomiting   Cefdinir Nausea And Vomiting and Diarrhea   Ciprofloxacin Hives and Itching   Clarithromycin Hives and Itching   Other     Bioxin    Penicillins     Pt states she isn't allergic to PCN    PHYSICAL EXAM:  Performance status (ECOG): 1 - Symptomatic but completely ambulatory  There were no vitals filed for this visit. Wt Readings from Last 3 Encounters:  11/30/21 128 lb (58.1 kg)  10/30/21 126 lb 12.8  oz (57.5 kg)  10/22/21 130 lb (59 kg)   Physical Exam Vitals reviewed.  Constitutional:      Appearance: Normal appearance.  Cardiovascular:     Rate and Rhythm: Normal rate and regular rhythm.     Heart sounds: Normal heart sounds.  Pulmonary:     Effort: Pulmonary effort is normal.     Breath sounds: Normal breath sounds.  Neurological:     Mental Status: She is alert.  Psychiatric:        Mood and Affect: Mood normal.        Behavior: Behavior normal.      LABORATORY DATA:  I have reviewed the labs as listed.     Latest Ref Rng & Units 03/11/2022   11:44 AM 11/30/2021    8:28 AM 11/04/2020   10:35 AM  CBC  WBC 4.0 - 10.5 K/uL 8.9  6.5  8.5   Hemoglobin 12.0 - 15.0 g/dL 12.8  13.6  12.4   Hematocrit 36.0 - 46.0 % 39.2  42.4  37.0   Platelets 150 - 400 K/uL 312  300  296       Latest Ref Rng & Units 03/11/2022   11:44 AM 11/30/2021    8:28 AM 11/04/2020   10:35 AM  CMP  Glucose 70 - 99 mg/dL 95  89  93   BUN 8 - 23 mg/dL 22  15  24    Creatinine 0.44 - 1.00 mg/dL 0.76  0.88  0.82   Sodium 135 - 145 mmol/L 134  142  135   Potassium 3.5 - 5.1 mmol/L 3.6  4.7  4.3   Chloride 98 - 111 mmol/L 98  103  98   CO2 22 - 32 mmol/L 29  23  21    Calcium 8.9 - 10.3 mg/dL 9.3  9.9  9.8   Total Protein 6.5 - 8.1 g/dL 7.2  6.8  7.2   Total Bilirubin 0.3 - 1.2 mg/dL 0.9  0.4  0.4   Alkaline Phos 38 - 126 U/L 55  64  65   AST 15 - 41 U/L 32  31  31   ALT 0 - 44 U/L 12  7  10      DIAGNOSTIC IMAGING:  I have  independently reviewed the scans and discussed with the patient. CT CHEST WO CONTRAST  Result Date: 03/12/2022 CLINICAL DATA:  Non-small-cell lung cancer. * Tracking Code: BO * EXAM: CT CHEST WITHOUT CONTRAST TECHNIQUE: Multidetector CT imaging of the chest was performed following the standard protocol without IV contrast. RADIATION DOSE REDUCTION: This exam was performed according to the departmental dose-optimization program which includes automated exposure control, adjustment of the mA and/or kV according to patient size and/or use of iterative reconstruction technique. COMPARISON:  Chest CT 02/10/2021 FINDINGS: Cardiovascular: Heart is nonenlarged. Mild pectus excavatum. Small pericardial effusion. Scattered vascular calcifications are seen. Slight ectasia of the ascending aorta with diameter approaching 3.9 by 3.7 cm. Non aneurysmal. Coronary artery calcifications are seen. Mediastinum/Nodes: No specific abnormal lymph node enlargement seen in the axillary regions, hilum or mediastinum. Atrophic thyroid gland. Normal caliber thoracic esophagus. Lungs/Pleura: Surgical changes from right lobectomy. Lungs are otherwise grossly clear. No pleural effusion, pneumothorax or consolidation. No new lung mass. Mild apical pleural thickening. Upper Abdomen: Splenic granulomas are identified. Adrenal glands are grossly preserved. Musculoskeletal: Curvature of the spine with scattered degenerative changes. IMPRESSION: Stable surgical changes of right lower lobectomy. No developing new mass lesion, fluid collection or lymph node enlargement in  the thorax. Aortic Atherosclerosis (ICD10-I70.0). Electronically Signed   By: Karen Kays M.D.   On: 03/12/2022 13:28     ASSESSMENT:  1.  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 - Right lower lobectomy in March 2020, 1.2 cm  primary. - Did not require any adjuvant chemotherapy. - Moved to [place] area in April 2022 to be closer to her daughter. - Hospitalized in June for sepsis.  CT scan of the chest, abdomen and pelvis with contrast on 08/15/2020 with no definite evidence of recurrence of malignancy. 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  2.  Social/family history: - Lives at home with her husband.  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  Smoked for 3 years, 3 to 4 cigarettes/day in her 80s. - Father had prostate cancer.  Brother was recently diagnosed with AML.   PLAN:  1.  Stage I right lower lobe lung adenocarcinoma: - CT chest without contrast (03/11/2022): Stable postsurgical changes with no new mass lesion or lymph node enlargement or fluid collection. - Labs from 03/11/2022 showed normal LFTs and CBC. - RTC 1 year for follow-up with CT without contrast.   Orders placed this encounter:  No orders of the defined types were placed in this encounter.    Doreatha Massed, MD [facility] [phone]

## 2022-05-18 IMAGING — MR MR HEAD W/O CM
4 of 6 series · 20 of 48 positions shown · non-contrast
Comparison: No pertinent prior exam.
COMPARISON: No pertinent prior exam.

CLINICAL DATA: Mental status change. Unknown cause. Non
communicative

EXAM:
MRI HEAD WITHOUT CONTRAST
MRA HEAD WITHOUT CONTRAST
TECHNIQUE: Multiplanar, multi-echo pulse sequences of the brain and surrounding
structures were acquired without intravenous contrast. Angiographic
images of the Circle of Willis were acquired using MRA technique
without intravenous contrast.

[Series 1003: tumble · 0.34mm/px · 7 of 13 slices shown (1 of 2)]
[im 1/13]
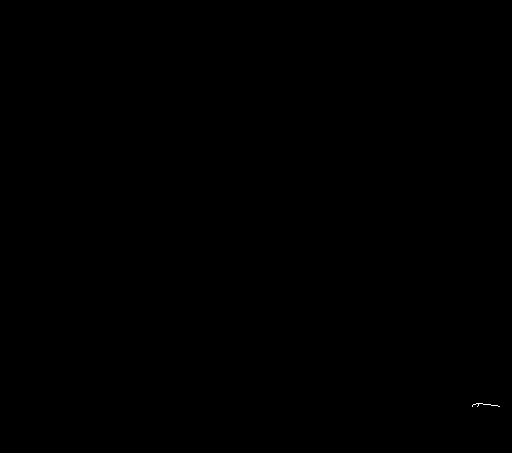
[im 3/13]
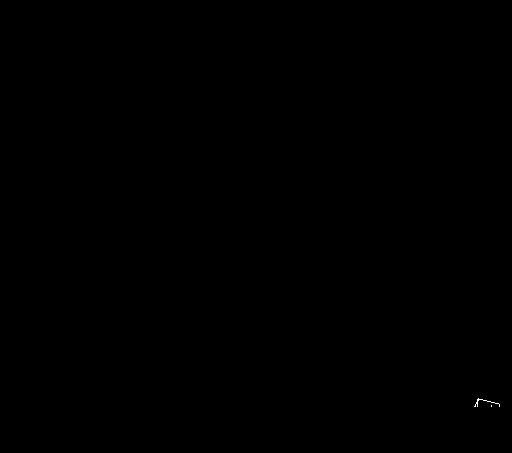
[im 5/13]
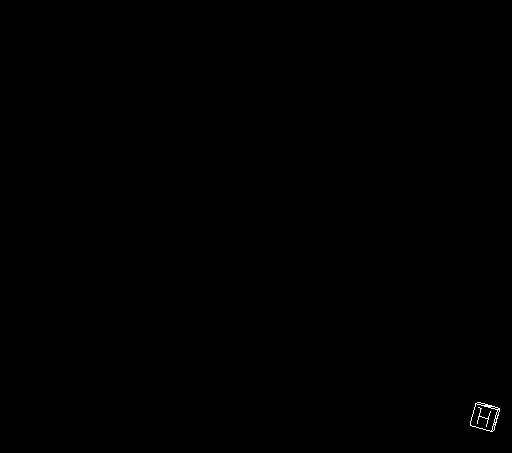
[im 7/13]
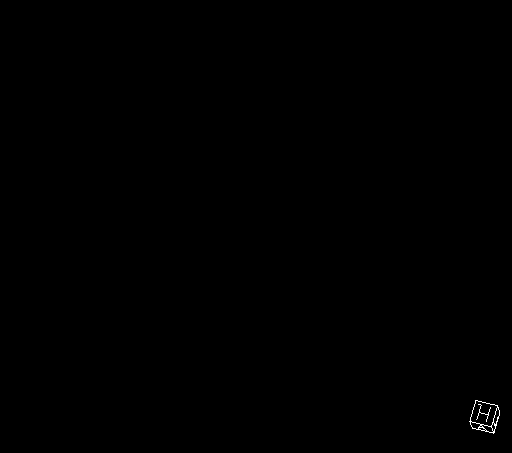
[im 9/13]
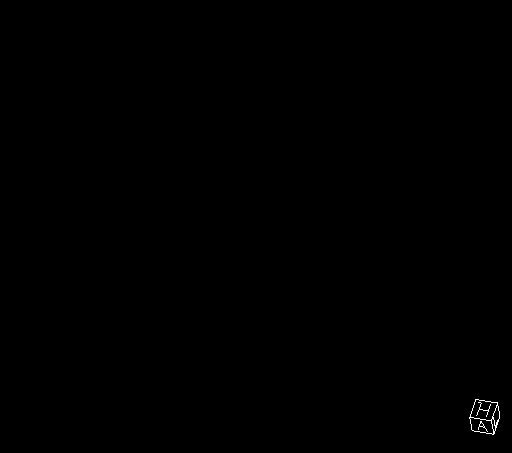
[im 11/13]
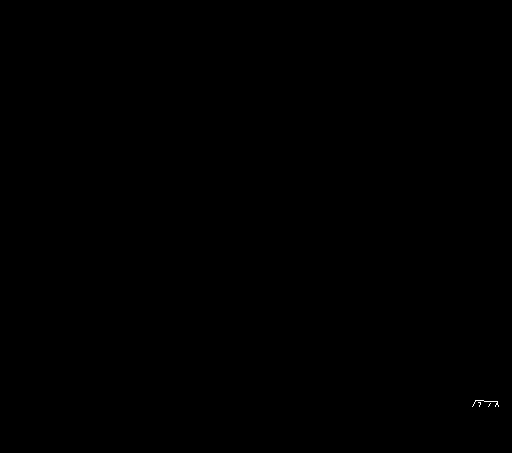
[im 13/13]
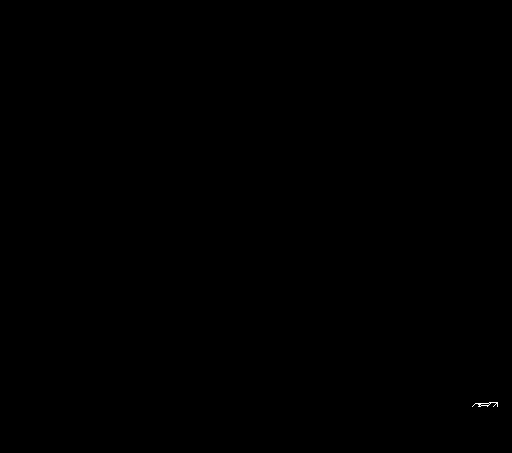

[Series 1007: rotate · 0.34mm/px · 6 of 11 slices shown (1 of 2)]
[im 1/11]
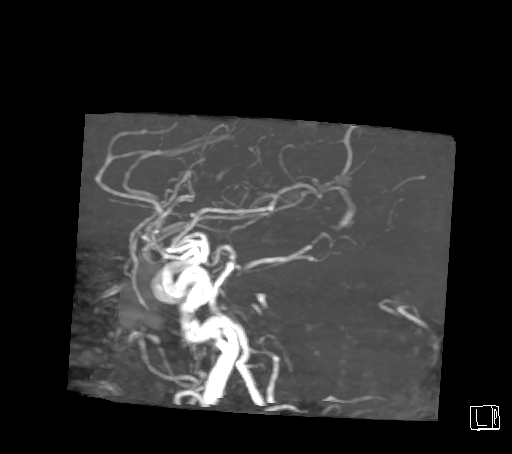
[im 3/11]
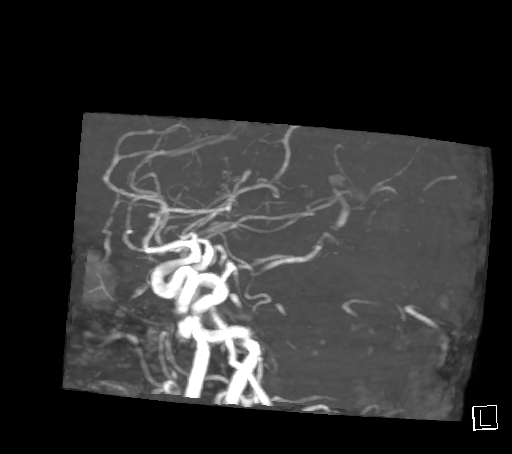
[im 5/11]
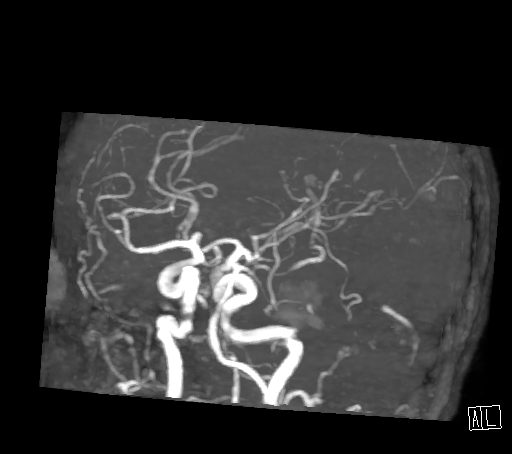
[im 7/11]
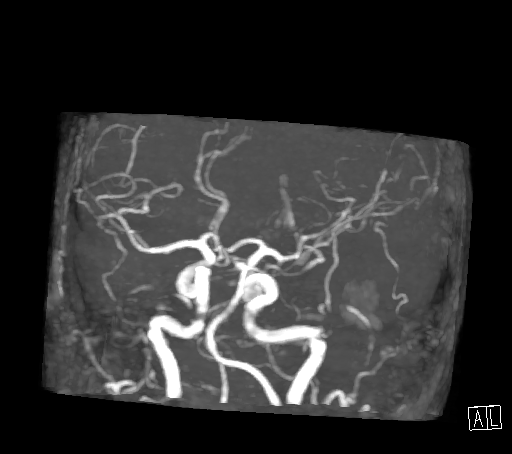
[im 9/11]
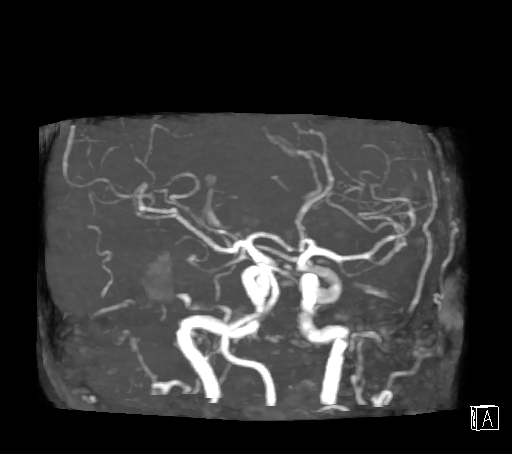
[im 11/11]
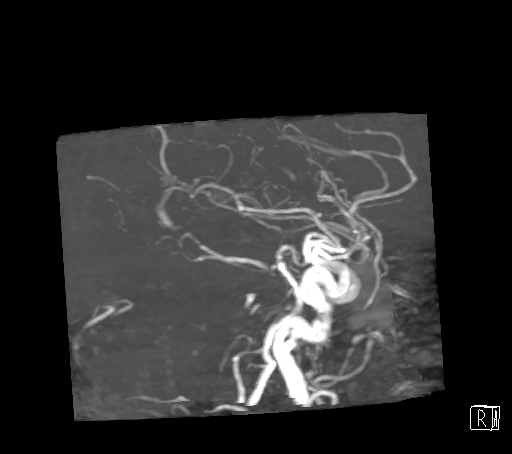

[Series 1011: tumble · 0.34mm/px · 4 of 14 slices shown (2 of 2)]
[im 1/14]
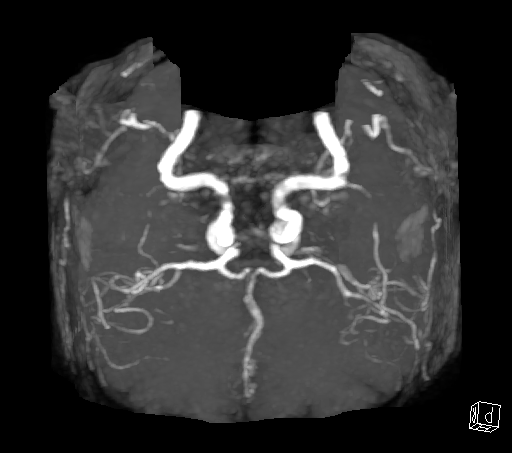
[im 2/14]
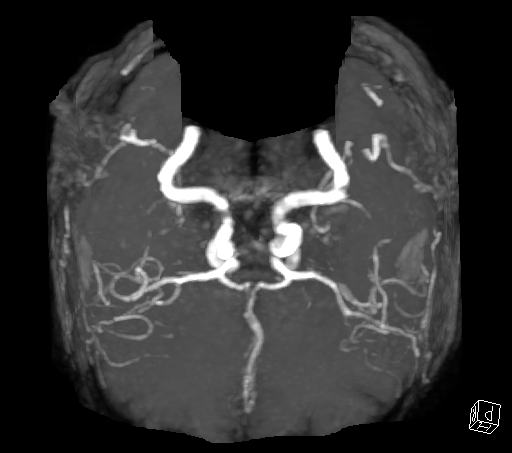
[im 8/14]
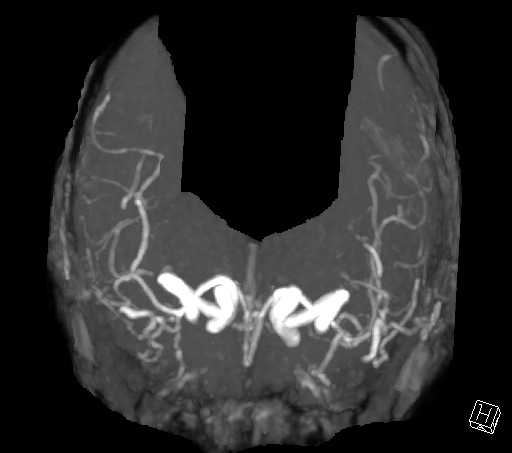
[im 12/14]
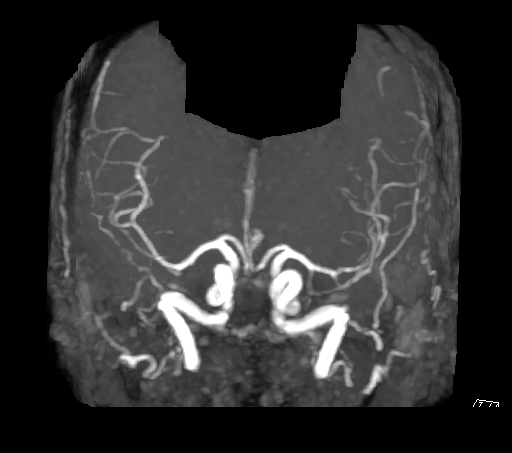

[Series 1015: rotate · 0.34mm/px · 3 of 11 slices shown (2 of 2)]
[im 3/11]
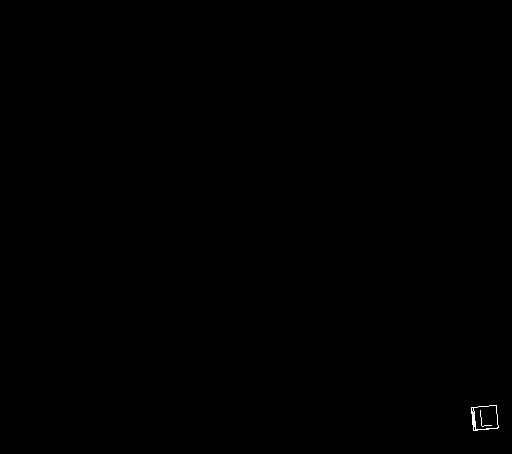
[im 7/11]
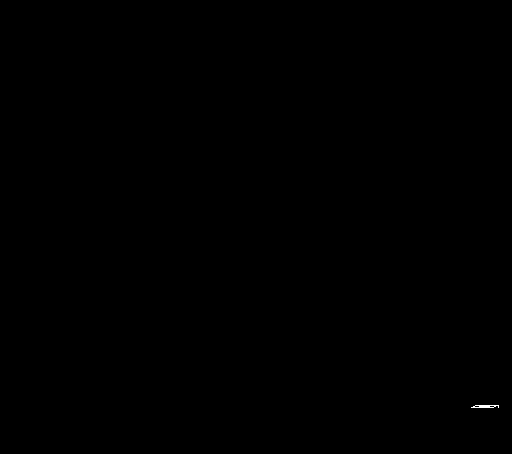
[im 11/11]
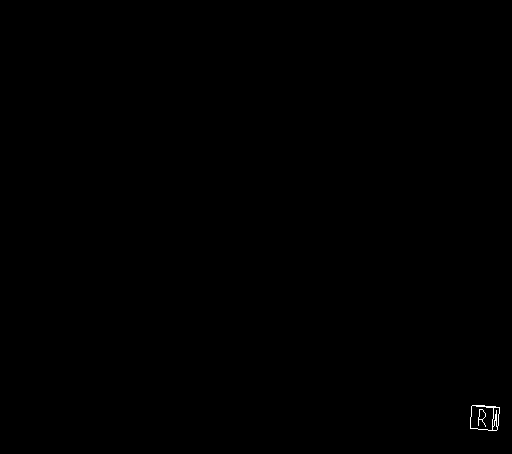

[20 of 48 positions shown; findings below may reference images not displayed]

FINDINGS: MRI HEAD FINDINGS

Brain: No acute infarct, hemorrhage, or mass lesion is present. Mild
white matter changes are within normal limits for age. The
ventricles are of normal size. No significant extraaxial fluid
collection is present. The internal auditory canals are within
normal limits. The brainstem and cerebellum are within normal
limits.

Vascular: Flow is present in the major intracranial arteries.

Skull and upper cervical spine: The craniocervical junction is
normal. Upper cervical spine is within normal limits. Marrow signal
is unremarkable.

Sinuses/Orbits: Bilateral mastoid effusions are present. No
obstructing nasopharyngeal lesion is evident. Diffuse mucosal
thickening is present throughout the remainder the paranasal
sinuses. No fluid levels are present. Bilateral lens replacements
are noted. Globes and orbits are otherwise unremarkable.

MRA HEAD FINDINGS

Anterior circulation: The internal carotid arteries are within
normal limits from the high cervical segments through the ICA
termini. The A1 and M1 segments are normal. No definite anterior
communicating artery is present. There is some patient motion. ACA
and MCA branch vessels are within normal limits.

Posterior circulation: The left vertebral artery is the dominant
vessel. PICA origins are visualized and normal. The vertebrobasilar
junction is normal. Prominent posterior communicating arteries are
present bilaterally. Small P1 segments are present. PCA branch
vessels are not well seen due to patient motion.

Anatomic variants: Prominent posterior communicating arteries
bilaterally.
IMPRESSION: 1. Normal MRI appearance of the brain for age.  No acute infarct.
2. Bilateral mastoid effusions. No obstructing nasopharyngeal lesion
is present.
3. Normal MRA circle-of-Willis without significant proximal
stenosis, aneurysm, or branch vessel occlusion.

## 2022-06-17 ENCOUNTER — Ambulatory Visit (INDEPENDENT_AMBULATORY_CARE_PROVIDER_SITE_OTHER): Payer: Medicare Other | Admitting: Family Medicine

## 2022-06-17 ENCOUNTER — Encounter: Payer: Self-pay | Admitting: Family Medicine

## 2022-06-17 VITALS — BP 129/71 | HR 69 | Temp 97.4°F | Ht 66.0 in | Wt 134.6 lb

## 2022-06-17 DIAGNOSIS — J0110 Acute frontal sinusitis, unspecified: Secondary | ICD-10-CM | POA: Diagnosis not present

## 2022-06-17 MED ORDER — DOXYCYCLINE HYCLATE 100 MG PO TABS: 100.0000 mg | ORAL_TABLET | Freq: Two times a day (BID) | ORAL | 0 refills | Status: AC

## 2022-06-17 NOTE — Progress Notes (Signed)
Subjective:  Patient ID: Deborah [name], female    DOB: 07/21/1945, 77 y.o.   MRN: [id number]  Patient Care Team: Gabriel EaringMorgan, Tiffany M, FNP as PCP - General (Family Medicine) Therese SarahEdwards, Morgan P, RN as Oncology Nurse Navigator (Oncology) Doreatha MassedKatragadda, Sreedhar, MD as Medical Oncologist (Oncology)   Chief Complaint:  facial pressure, ears popping, and Headache (X 3-4 weeks on and off - cold and sinus otw )   HPI: Deborah [name] is a 77 y.o. female presenting on 06/17/2022 for facial pressure, ears popping, and Headache (X 3-4 weeks on and off - cold and sinus otw )   Headache  This is a new problem. Episode onset: 3 weeks ago. The problem has been gradually worsening. The pain is located in the Frontal region. The pain does not radiate. Associated symptoms include drainage, rhinorrhea and sinus pressure. Pertinent negatives include no abdominal pain, abnormal behavior, anorexia, back pain, blurred vision, coughing, dizziness, ear pain, eye pain, eye redness, eye watering, facial sweating, fever, hearing loss, insomnia, loss of balance, muscle aches, nausea, neck pain, numbness, phonophobia, photophobia, scalp tenderness, seizures, sore throat, swollen glands, tingling, tinnitus, visual change, vomiting, weakness or weight loss. She has tried acetaminophen for the symptoms. The treatment provided mild relief.  Sinus Problem Episode onset: 3 weeks ago. The problem has been gradually worsening since onset. The pain is moderate. Associated symptoms include congestion, headaches and sinus pressure. Pertinent negatives include no chills, coughing, diaphoresis, ear pain, hoarse voice, neck pain, shortness of breath, sneezing, sore throat or swollen glands. Past treatments include oral decongestants, nasal decongestants and acetaminophen. The treatment provided mild relief.    Relevant past medical, surgical, family, and social history reviewed and updated as indicated.  Allergies and medications  reviewed and updated. Data reviewed: Chart in Epic.   Past Medical History:  Diagnosis Date   Anxiety    Arthritis    Asthma    GERD (gastroesophageal reflux disease)    Hiatal hernia    Hyperlipidemia    Lung cancer    Osteopenia    Parkinson's syndrome    Raynaud's disease     Past Surgical History:  Procedure Laterality Date   BACK SURGERY  1995   Decompress disc lumbar   BREAST BIOPSY  1995   CATARACT EXTRACTION, BILATERAL Bilateral 2017   COLONOSCOPY  08/20/2015   repeat in 5 years   ESOPHAGOGASTRODUODENOSCOPY  2021   LUNG REMOVAL, PARTIAL Right    REFRACTIVE SURGERY Left 08/24/2016    Social History   Socioeconomic History   Marital status: Married    Spouse name: Riley LamDouglas   Number of children: 1   Years of education: Not on file   Highest education level: Not on file  Occupational History   Occupation: retired  Tobacco Use   Smoking status: Former    Packs/day: 0.25    Years: 3.00    Additional pack years: 0.00    Total pack years: 0.75    Types: Cigarettes    Quit date: 07/23/1960    Years since quitting: 61.9   Smokeless tobacco: Never  Vaping Use   Vaping Use: Never used  Substance and Sexual Activity   Alcohol use: Not Currently    Alcohol/week: 0.0 - 1.0 standard drinks of alcohol    Comment: occ   Drug use: Never   Sexual activity: Yes    Partners: Male    Birth control/protection: None  Other Topics Concern   Not on file  Social  History Narrative   Moved here from [place] with husband Riley Lam beginning of 2022. Their child lives nearby.   Social Determinants of Health   Financial Resource Strain: Low Risk  (09/18/2021)   Overall Financial Resource Strain (CARDIA)    Difficulty of Paying Living Expenses: Not hard at all  Food Insecurity: No Food Insecurity (09/18/2021)   Hunger Vital Sign    Worried About Running Out of Food in the Last Year: Never true    Ran Out of Food in the Last Year: Never true  Transportation Needs: No  Transportation Needs (09/18/2021)   PRAPARE - Administrator, Civil Service (Medical): No    Lack of Transportation (Non-Medical): No  Physical Activity: Sufficiently Active (09/18/2021)   Exercise Vital Sign    Days of Exercise per Week: 5 days    Minutes of Exercise per Session: 60 min  Stress: No Stress Concern Present (09/18/2021)   [employer] of Occupational Health - Occupational Stress Questionnaire    Feeling of Stress : Only a little  Social Connections: Moderately Isolated (09/18/2021)   Social Connection and Isolation Panel [NHANES]    Frequency of Communication with Friends and Family: More than three times a week    Frequency of Social Gatherings with Friends and Family: Twice a week    Attends Religious Services: Never    Database administrator or Organizations: No    Attends Banker Meetings: Never    Marital Status: Married  Catering manager Violence: Not At Risk (09/18/2021)   Humiliation, Afraid, Rape, and Kick questionnaire    Fear of Current or Ex-Partner: No    Emotionally Abused: No    Physically Abused: No    Sexually Abused: No    Outpatient Encounter Medications as of 06/17/2022  Medication Sig   atenolol (TENORMIN) 25 MG tablet Take 1 tablet (25 mg total) by mouth daily.   Calcium Carbonate-Vit D-Min (CALCIUM 1200 PO) Take 1,200 mg by mouth daily.   carbidopa-levodopa (SINEMET IR) 25-100 MG tablet Take 2 tablets by mouth in the morning, at noon, and at bedtime.   cetirizine (ZYRTEC) 10 MG tablet Take 10 mg by mouth daily.   cholecalciferol (VITAMIN D3) 25 MCG (1000 UNIT) tablet Take 1,000 Units by mouth daily.   doxycycline (VIBRA-TABS) 100 MG tablet Take 1 tablet (100 mg total) by mouth 2 (two) times daily for 10 days. 1 po bid   escitalopram (LEXAPRO) 20 MG tablet Take 1 tablet (20 mg total) by mouth daily.   levocetirizine (XYZAL) 5 MG tablet Take 1 tablet (5 mg total) by mouth every evening.   methocarbamol (ROBAXIN) 500 MG  tablet Take 1 tablet (500 mg total) by mouth every 8 (eight) hours as needed for muscle spasms.   rosuvastatin (CRESTOR) 5 MG tablet TAKE 1 TABLET (5 MG TOTAL) BY MOUTH DAILY.   triamcinolone (NASACORT) 55 MCG/ACT AERO nasal inhaler Place 2 sprays into the nose daily.   No facility-administered encounter medications on file as of 06/17/2022.    Allergies  Allergen Reactions   Sulfa Antibiotics Hives and Itching   Amoxicillin-Pot Clavulanate Nausea And Vomiting   Cefdinir Nausea And Vomiting and Diarrhea   Ciprofloxacin Hives and Itching   Clarithromycin Hives and Itching   Other     Bioxin    Penicillins     Pt states she isn't allergic to PCN    Review of Systems  Constitutional:  Negative for activity change, appetite change, chills, diaphoresis, fatigue, fever,  unexpected weight change and weight loss.  HENT:  Positive for congestion, postnasal drip, rhinorrhea, sinus pressure and sinus pain. Negative for dental problem, drooling, ear discharge, ear pain, facial swelling, hearing loss, hoarse voice, mouth sores, nosebleeds, sneezing, sore throat, tinnitus, trouble swallowing and voice change.   Eyes: Negative.  Negative for blurred vision, photophobia, pain, redness and visual disturbance.  Respiratory:  Negative for cough, chest tightness and shortness of breath.   Cardiovascular:  Negative for chest pain, palpitations and leg swelling.  Gastrointestinal:  Negative for abdominal pain, anorexia, blood in stool, constipation, diarrhea, nausea and vomiting.  Endocrine: Negative.   Genitourinary:  Negative for decreased urine volume, difficulty urinating, dysuria, frequency and urgency.  Musculoskeletal:  Negative for arthralgias, back pain, myalgias and neck pain.  Skin: Negative.   Allergic/Immunologic: Negative.   Neurological:  Positive for headaches. Negative for dizziness, tingling, tremors, seizures, syncope, facial asymmetry, speech difficulty, weakness, light-headedness,  numbness and loss of balance.  Hematological: Negative.   Psychiatric/Behavioral:  Negative for confusion, hallucinations, sleep disturbance and suicidal ideas. The patient does not have insomnia.   All other systems reviewed and are negative.       Objective:  BP 129/71   Pulse 69   Temp (!) 97.4 F (36.3 C) (Temporal)   Ht  (1.676 m)   Wt 134 lb 9.6 oz (61.1 kg)   SpO2 99%   BMI 21.73 kg/m    Wt Readings from Last 3 Encounters:  06/17/22 134 lb 9.6 oz (61.1 kg)  03/23/22 134 lb 3.2 oz (60.9 kg)  11/30/21 128 lb (58.1 kg)    Physical Exam Vitals and nursing note reviewed.  Constitutional:      General: She is not in acute distress.    Appearance: Normal appearance. She is well-developed. She is not ill-appearing, toxic-appearing or diaphoretic.  HENT:     Head: Normocephalic and atraumatic.     Right Ear: A middle ear effusion is present. Tympanic membrane is not erythematous.     Left Ear: A middle ear effusion is present. Tympanic membrane is not erythematous.     Nose: Congestion and rhinorrhea present.     Right Sinus: Frontal sinus tenderness present. No maxillary sinus tenderness.     Left Sinus: Frontal sinus tenderness present. No maxillary sinus tenderness.     Mouth/Throat:     Mouth: Mucous membranes are moist.     Pharynx: No pharyngeal swelling, oropharyngeal exudate, posterior oropharyngeal erythema or uvula swelling.     Comments: Postnasal drip Eyes:     Conjunctiva/sclera: Conjunctivae normal.     Pupils: Pupils are equal, round, and reactive to light.  Cardiovascular:     Rate and Rhythm: Normal rate and regular rhythm.     Heart sounds: Normal heart sounds.  Pulmonary:     Effort: Pulmonary effort is normal.     Breath sounds: Normal breath sounds.  Skin:    General: Skin is warm and dry.     Capillary Refill: Capillary refill takes less than 2 seconds.  Neurological:     General: No focal deficit present.     Mental Status: She is alert  and oriented to person, place, and time.  Psychiatric:        Mood and Affect: Mood normal.        Behavior: Behavior normal. Behavior is cooperative.        Thought Content: Thought content normal.        Judgment: Judgment normal.  Results for orders placed or performed in visit on 03/11/22  Comprehensive metabolic panel  Result Value Ref Range   Sodium 134 (L) 135 - 145 mmol/L   Potassium 3.6 3.5 - 5.1 mmol/L   Chloride 98 98 - 111 mmol/L   CO2 29 22 - 32 mmol/L   Glucose, Bld 95 70 - 99 mg/dL   BUN 22 8 - 23 mg/dL   Creatinine, Ser 1.61 0.44 - 1.00 mg/dL   Calcium 9.3 8.9 - 09.6 mg/dL   Total Protein 7.2 6.5 - 8.1 g/dL   Albumin 4.1 3.5 - 5.0 g/dL   AST 32 15 - 41 U/L   ALT 12 0 - 44 U/L   Alkaline Phosphatase 55 38 - 126 U/L   Total Bilirubin 0.9 0.3 - 1.2 mg/dL   GFR, Estimated >04 >54 mL/min   Anion gap 7 5 - 15  CBC with Differential  Result Value Ref Range   WBC 8.9 4.0 - 10.5 K/uL   RBC 4.11 3.87 - 5.11 MIL/uL   Hemoglobin 12.8 12.0 - 15.0 g/dL   HCT 09.8 11.9 - 14.7 %   MCV 95.4 80.0 - 100.0 fL   MCH 31.1 26.0 - 34.0 pg   MCHC 32.7 30.0 - 36.0 g/dL   RDW 82.9 56.2 - 13.0 %   Platelets 312 150 - 400 K/uL   nRBC 0.0 0.0 - 0.2 %   Neutrophils Relative % 65 %   Neutro Abs 5.8 1.7 - 7.7 K/uL   Lymphocytes Relative 23 %   Lymphs Abs 2.0 0.7 - 4.0 K/uL   Monocytes Relative 9 %   Monocytes Absolute 0.8 0.1 - 1.0 K/uL   Eosinophils Relative 2 %   Eosinophils Absolute 0.2 0.0 - 0.5 K/uL   Basophils Relative 1 %   Basophils Absolute 0.1 0.0 - 0.1 K/uL   Immature Granulocytes 0 %   Abs Immature Granulocytes 0.03 0.00 - 0.07 K/uL       Pertinent labs & imaging results that were available during my care of the patient were reviewed by me and considered in my medical decision making.  Assessment & Plan:  Reona was seen today for facial pressure, ears popping and headache.  Diagnoses and all orders for this visit:  Acute non-recurrent frontal  sinusitis Ongoing for over 2 weeks, has failed symptomatic care at home. Will add below. Pt aware to start Mucinex to help thin secretions. Continue antihistamine and nasal spray. Report new, worsening, or persistent symptoms.  -     doxycycline (VIBRA-TABS) 100 MG tablet; Take 1 tablet (100 mg total) by mouth 2 (two) times daily for 10 days. 1 po bid     Continue all other maintenance medications.  Follow up plan: Return if symptoms worsen or fail to improve.   Continue healthy lifestyle choices, including diet (rich in fruits, vegetables, and lean proteins, and low in salt and simple carbohydrates) and exercise (at least 30 minutes of moderate physical activity daily).  Educational handout given for sinus  The above assessment and management plan was discussed with the patient. The patient verbalized understanding of and has agreed to the management plan. Patient is aware to call the clinic if they develop any new symptoms or if symptoms persist or worsen. Patient is aware when to return to the clinic for a follow-up visit. Patient educated on when it is appropriate to go to the emergency department.   Kari Baars, FNP-C [facility] [phone]

## 2022-06-20 ENCOUNTER — Other Ambulatory Visit: Payer: Self-pay | Admitting: Family Medicine

## 2022-06-20 DIAGNOSIS — F411 Generalized anxiety disorder: Secondary | ICD-10-CM

## 2022-06-20 DIAGNOSIS — R000 Tachycardia, unspecified: Secondary | ICD-10-CM

## 2022-06-21 ENCOUNTER — Other Ambulatory Visit: Payer: Self-pay | Admitting: Family Medicine

## 2022-06-21 DIAGNOSIS — G20C Parkinsonism, unspecified: Secondary | ICD-10-CM

## 2022-06-24 ENCOUNTER — Other Ambulatory Visit: Payer: Self-pay | Admitting: Family Medicine

## 2022-06-24 DIAGNOSIS — Z1231 Encounter for screening mammogram for malignant neoplasm of breast: Secondary | ICD-10-CM

## 2022-06-30 ENCOUNTER — Ambulatory Visit
Admission: RE | Admit: 2022-06-30 | Discharge: 2022-06-30 | Disposition: A | Discharge status: Home / Self Care | Payer: Medicare Other | Source: Ambulatory Visit | Attending: Family Medicine | Admitting: Family Medicine

## 2022-06-30 DIAGNOSIS — Z1231 Encounter for screening mammogram for malignant neoplasm of breast: Secondary | ICD-10-CM

## 2022-07-15 ENCOUNTER — Other Ambulatory Visit: Payer: Self-pay | Admitting: Family Medicine

## 2022-07-15 DIAGNOSIS — F411 Generalized anxiety disorder: Secondary | ICD-10-CM

## 2022-07-15 DIAGNOSIS — R000 Tachycardia, unspecified: Secondary | ICD-10-CM

## 2022-07-15 DIAGNOSIS — G20C Parkinsonism, unspecified: Secondary | ICD-10-CM

## 2022-07-19 ENCOUNTER — Other Ambulatory Visit: Payer: Self-pay | Admitting: Family Medicine

## 2022-07-19 DIAGNOSIS — F411 Generalized anxiety disorder: Secondary | ICD-10-CM

## 2022-07-19 DIAGNOSIS — R000 Tachycardia, unspecified: Secondary | ICD-10-CM

## 2022-07-19 MED ORDER — ESCITALOPRAM OXALATE 20 MG PO TABS: 20.0000 mg | ORAL_TABLET | Freq: Every day | ORAL | 0 refills | Status: DC

## 2022-07-19 MED ORDER — ATENOLOL 25 MG PO TABS: 25.0000 mg | ORAL_TABLET | Freq: Every day | ORAL | 0 refills | Status: DC

## 2022-07-19 NOTE — Telephone Encounter (Signed)
Tiffany pt NTBS 30-d given 06/21/22

## 2022-07-19 NOTE — Addendum Note (Signed)
Addended by: Julious Payer D on: 07/19/2022 01:54 PM   Modules accepted: Orders

## 2022-07-19 NOTE — Telephone Encounter (Signed)
Pt was told to come back in a year which will be in Sept. Message will be sent to dr to see if she needs to be seen earlierl

## 2022-08-04 ENCOUNTER — Encounter: Payer: Self-pay | Admitting: Family Medicine

## 2022-08-04 ENCOUNTER — Ambulatory Visit (INDEPENDENT_AMBULATORY_CARE_PROVIDER_SITE_OTHER): Payer: Medicare Other | Admitting: Family Medicine

## 2022-08-04 VITALS — BP 107/61 | HR 71 | Temp 98.1°F | Ht 66.0 in | Wt 131.4 lb

## 2022-08-04 DIAGNOSIS — R21 Rash and other nonspecific skin eruption: Secondary | ICD-10-CM | POA: Diagnosis not present

## 2022-08-04 MED ORDER — TRIAMCINOLONE ACETONIDE 0.5 % EX OINT
1.0000 | TOPICAL_OINTMENT | Freq: Two times a day (BID) | CUTANEOUS | 0 refills | Status: DC
Start: 2022-08-04 — End: 2022-12-30

## 2022-08-04 NOTE — Progress Notes (Signed)
   Acute Office Visit  Subjective:     Patient ID: Deborah [name], female    DOB: January 09, 1946, 77 y.o.   MRN: [id number]  Chief Complaint  Patient presents with   Rash    Rash This is a new problem. The current episode started in the past 7 days. The problem has been gradually improving since onset. The affected locations include the right upper leg, right lower leg, right shoulder, left upper leg and left lower leg. The rash is characterized by itchiness (fluid filled bumps). She was exposed to nothing. Pertinent negatives include no anorexia, congestion, cough, diarrhea, eye pain, facial edema, fatigue, fever, joint pain, nail changes, rhinorrhea, shortness of breath, sore throat or vomiting. Past treatments include topical steroids. The treatment provided moderate relief. Her past medical history is significant for allergies. There is no history of asthma or eczema.     Review of Systems  Constitutional:  Negative for fatigue and fever.  HENT:  Negative for congestion, rhinorrhea and sore throat.   Eyes:  Negative for pain.  Respiratory:  Negative for cough and shortness of breath.   Gastrointestinal:  Negative for anorexia, diarrhea and vomiting.  Musculoskeletal:  Negative for joint pain.  Skin:  Positive for rash. Negative for nail changes.        Objective:    BP 107/61   Pulse 71   Temp 98.1 F (36.7 C) (Temporal)   Ht 5\' 6"  (1.676 m)   Wt 131 lb 6 oz (59.6 kg)   SpO2 97%   BMI 21.20 kg/m    Physical Exam Vitals and nursing note reviewed.  Constitutional:      Appearance: Normal appearance.  HENT:     Head: Normocephalic and atraumatic.     Nose: Nose normal.     Mouth/Throat:     Mouth: Mucous membranes are moist.     Pharynx: Oropharynx is clear.  Pulmonary:     Effort: Pulmonary effort is normal. No respiratory distress.  Musculoskeletal:     Cervical back: Neck supple. No rigidity.  Skin:    General: Skin is warm and dry.     Findings: Rash  present. Rash is vesicular (scattered small vesciles to bilateral upper and lower legs. No signs of infection).  Neurological:     General: No focal deficit present.     Mental Status: She is alert and oriented to person, place, and time.  Psychiatric:        Mood and Affect: Mood normal.        Behavior: Behavior normal.     No results found for any visits on 08/04/22.      Assessment & Plan:   Clarinda was seen today for rash.  Diagnoses and all orders for this visit:  Rash ? Dermatitis, though denies possible irritants. Has has improvement with topical steroids. Kenalog as below. Return to office for new or worsening symptoms, or if symptoms persist.  -     triamcinolone ointment (KENALOG) 0.5 %; Apply 1 Application topically 2 (two) times daily.   The patient indicates understanding of these issues and agrees with the plan.  Gabriel Earing, FNP

## 2022-09-17 ENCOUNTER — Other Ambulatory Visit: Payer: Self-pay | Admitting: Family Medicine

## 2022-09-17 DIAGNOSIS — I700 Atherosclerosis of aorta: Secondary | ICD-10-CM

## 2022-10-04 ENCOUNTER — Ambulatory Visit (INDEPENDENT_AMBULATORY_CARE_PROVIDER_SITE_OTHER): Payer: Medicare Other

## 2022-10-04 VITALS — Ht 66.0 in | Wt 128.0 lb

## 2022-10-04 DIAGNOSIS — Z1211 Encounter for screening for malignant neoplasm of colon: Secondary | ICD-10-CM | POA: Diagnosis not present

## 2022-10-04 DIAGNOSIS — Z0000 Encounter for general adult medical examination without abnormal findings: Secondary | ICD-10-CM | POA: Diagnosis not present

## 2022-10-04 DIAGNOSIS — Z0100 Encounter for examination of eyes and vision without abnormal findings: Secondary | ICD-10-CM

## 2022-10-04 NOTE — Progress Notes (Signed)
Subjective:   Deborah [name] is a 77 y.o. female who presents for Medicare Annual (Subsequent) preventive examination.  Visit Complete: Virtual  I connected with  Deborah [name] on 10/04/22 by a audio enabled telemedicine application and verified that I am speaking with the correct person using two identifiers.  Patient Location: Home  Provider Location: Home Office  I discussed the limitations of evaluation and management by telemedicine. The patient expressed understanding and agreed to proceed.  Patient Medicare AWV questionnaire was completed by the patient on 10/04/2022; I have confirmed that all information answered by patient is correct and no changes since this date.  Review of Systems    Vital Signs: Unable to obtain new vitals due to this being a telehealth visit.  Cardiac Risk Factors include: advanced age (>38men, >29 women)     Objective:    Today's Vitals   10/04/22 0850  Weight: 128 lb (58.1 kg)  Height: 5\' 6"  (1.676 m)   Body mass index is 20.66 kg/m.     10/04/2022    8:55 AM 03/23/2022   11:11 AM 09/18/2021    9:10 AM 02/23/2021    2:05 PM 11/11/2020    1:22 PM 09/17/2020    9:23 AM 08/14/2020    8:00 PM  Advanced Directives  Does Patient Have a Medical Advance Directive? No No Yes No No No No  Type of Surveyor, minerals;Living will      Copy of Healthcare Power of Attorney in Chart?   No - copy requested      Would patient like information on creating a medical advance directive? Yes (MAU/Ambulatory/Procedural Areas - Information given) No - Patient declined  No - Patient declined No - Patient declined No - Patient declined No - Patient declined    Current Medications (verified) Outpatient Encounter Medications as of 10/04/2022  Medication Sig   atenolol (TENORMIN) 25 MG tablet Take 1 tablet (25 mg total) by mouth daily.   Calcium Carbonate-Vit D-Min (CALCIUM 1200 PO) Take 1,200 mg by mouth daily.   carbidopa-levodopa  (SINEMET IR) 25-100 MG tablet Take 2 tablets by mouth in the morning, at noon, and at bedtime. (NEEDS TO BE SEEN BEFORE NEXT REFILL)   cetirizine (ZYRTEC) 10 MG tablet Take 10 mg by mouth daily.   cholecalciferol (VITAMIN D3) 25 MCG (1000 UNIT) tablet Take 1,000 Units by mouth daily.   escitalopram (LEXAPRO) 20 MG tablet Take 1 tablet (20 mg total) by mouth daily.   levocetirizine (XYZAL) 5 MG tablet Take 1 tablet (5 mg total) by mouth every evening.   methocarbamol (ROBAXIN) 500 MG tablet Take 1 tablet (500 mg total) by mouth every 8 (eight) hours as needed for muscle spasms.   rosuvastatin (CRESTOR) 5 MG tablet Take 1 tablet (5 mg total) by mouth daily. (NEEDS TO BE SEEN BEFORE NEXT REFILL)   triamcinolone (NASACORT) 55 MCG/ACT AERO nasal inhaler Place 2 sprays into the nose daily.   triamcinolone ointment (KENALOG) 0.5 % Apply 1 Application topically 2 (two) times daily.   No facility-administered encounter medications on file as of 10/04/2022.    Allergies (verified) Sulfa antibiotics, Amoxicillin-pot clavulanate, Cefdinir, Ciprofloxacin, Clarithromycin, Other, and Penicillins   History: Past Medical History:  Diagnosis Date   Anxiety    Arthritis    Asthma    GERD (gastroesophageal reflux disease)    Hiatal hernia    Hyperlipidemia    Lung cancer (HCC)    Osteopenia  Parkinson's syndrome    Raynaud's disease    Past Surgical History:  Procedure Laterality Date   BACK SURGERY  1995   Decompress disc lumbar   BREAST BIOPSY  1995   CATARACT EXTRACTION, BILATERAL Bilateral 2017   COLONOSCOPY  08/20/2015   repeat in 5 years   ESOPHAGOGASTRODUODENOSCOPY  2021   LUNG REMOVAL, PARTIAL Right    REFRACTIVE SURGERY Left 08/24/2016   Family History  Problem Relation Age of Onset   Dementia Mother    Heart disease Father    Diabetes Father    Prostate cancer Father    Diabetes Brother    Acute myelogenous leukemia Brother    Parkinson's disease Neg Hx    Breast cancer Neg  Hx    Social History   Socioeconomic History   Marital status: Married    Spouse name: Riley Lam   Number of children: 1   Years of education: Not on file   Highest education level: 12th grade  Occupational History   Occupation: retired  Tobacco Use   Smoking status: Former    Current packs/day: 0.00    Average packs/day: 0.3 packs/day for 3.0 years (0.8 ttl pk-yrs)    Types: Cigarettes    Start date: 07/23/1957    Quit date: 07/23/1960    Years since quitting: 62.2   Smokeless tobacco: Never  Vaping Use   Vaping status: Never Used  Substance and Sexual Activity   Alcohol use: Not Currently    Alcohol/week: 0.0 - 1.0 standard drinks of alcohol    Comment: occ   Drug use: Never   Sexual activity: Yes    Partners: Male    Birth control/protection: None  Other Topics Concern   Not on file  Social History Narrative   Moved here from [place] with husband Riley Lam beginning of 2022. Their child lives nearby.   Social Determinants of Health   Financial Resource Strain: Low Risk  (10/04/2022)   Overall Financial Resource Strain (CARDIA)    Difficulty of Paying Living Expenses: Not hard at all  Food Insecurity: No Food Insecurity (10/04/2022)   Hunger Vital Sign    Worried About Running Out of Food in the Last Year: Never true    Ran Out of Food in the Last Year: Never true  Transportation Needs: No Transportation Needs (10/04/2022)   PRAPARE - Administrator, Civil Service (Medical): No    Lack of Transportation (Non-Medical): No  Physical Activity: Sufficiently Active (10/04/2022)   Exercise Vital Sign    Days of Exercise per Week: 5 days    Minutes of Exercise per Session: 60 min  Stress: No Stress Concern Present (10/04/2022)   [employer] of Occupational Health - Occupational Stress Questionnaire    Feeling of Stress : Not at all  Social Connections: Moderately Isolated (10/04/2022)   Social Connection and Isolation Panel [NHANES]    Frequency of Communication  with Friends and Family: More than three times a week    Frequency of Social Gatherings with Friends and Family: More than three times a week    Attends Religious Services: Never    Database administrator or Organizations: No    Attends Engineer, structural: Never    Marital Status: Married    Tobacco Counseling Counseling given: Not Answered   Clinical Intake:  Pre-visit preparation completed: Yes  Pain : No/denies pain     Nutritional Risks: None Diabetes: No  How often do you need to have someone  help you when you read instructions, pamphlets, or other written materials from your doctor or pharmacy?: 1 - Never  Interpreter Needed?: No  Information entered by :: Renie Ora, LPN   Activities of Daily Living    10/04/2022    8:55 AM 09/30/2022   11:19 AM  In your present state of health, do you have any difficulty performing the following activities:  Hearing? 0 0  Vision? 0 0  Difficulty concentrating or making decisions? 0 0  Walking or climbing stairs? 0 0  Dressing or bathing? 0 0  Doing errands, shopping? 0 0  Preparing Food and eating ? N N  Using the Toilet? N N  In the past six months, have you accidently leaked urine? N N  Do you have problems with loss of bowel control? N N  Managing your Medications? N N  Managing your Finances? N N  Housekeeping or managing your Housekeeping? N N    Patient Care Team: Gabriel Earing, FNP as PCP - General (Family Medicine) Therese Sarah, RN as Oncology Nurse Navigator (Oncology) Doreatha Massed, MD as Medical Oncologist (Oncology)  Indicate any recent Medical Services you may have received from other than [facility] providers in the past year (date may be approximate).     Assessment:   This is a routine wellness examination for Deborah [name].  Hearing/Vision screen Vision Screening - Comments:: Referral 10/04/2022  Dietary issues and exercise activities discussed:     Goals Addressed              This Visit's Progress    Exercise 3x per week (30 min per time)   On track    Continue using your treadmill and walking Currently using 5 days per week 1 hour am, 30 min pm       Depression Screen    10/04/2022    8:54 AM 11/30/2021    8:10 AM 10/30/2021    8:26 AM 09/18/2021    9:05 AM 11/04/2020    9:53 AM 09/17/2020    9:22 AM 09/04/2020   10:03 AM  PHQ 2/9 Scores  PHQ - 2 Score 0 0 0 0 0 2 2  PHQ- 9 Score  0 0  0 9 9    Fall Risk    10/04/2022    8:52 AM 09/30/2022   11:19 AM 11/30/2021    8:10 AM 10/30/2021    8:26 AM 09/18/2021    9:04 AM  Fall Risk   Falls in the past year? 0 0 0 0 0  Number falls in past yr: 0    0  Injury with Fall? 0 0   0  Risk for fall due to : No Fall Risks    No Fall Risks  Follow up Falls prevention discussed    Falls prevention discussed    MEDICARE RISK AT HOME:  Medicare Risk at Home - 10/04/22 0852     Any stairs in or around the home? No    If so, are there any without handrails? No    Home free of loose throw rugs in walkways, pet beds, electrical cords, etc? Yes    Adequate lighting in your home to reduce risk of falls? Yes    Life alert? No    Use of a cane, walker or w/c? No    Grab bars in the bathroom? Yes    Shower chair or bench in shower? Yes    Elevated toilet seat or a handicapped toilet? Yes  TIMED UP AND GO:  Was the test performed?  No    Cognitive Function:        10/04/2022    8:56 AM 09/18/2021    9:08 AM 09/17/2020    9:26 AM  6CIT Screen  What Year? 0 points 0 points 0 points  What month? 0 points 0 points 0 points  What time? 0 points 0 points 0 points  Count back from 20 0 points 0 points 0 points  Months in reverse 0 points 0 points 0 points  Repeat phrase 0 points 0 points 0 points  Total Score 0 points 0 points 0 points    Immunizations Immunization History  Administered Date(s) Administered   DT (Pediatric) 01/27/1999   Fluad Quad(high Dose 65+) 11/03/2020   Influenza  Split 10/23/2019   Influenza, High Dose Seasonal PF 11/08/2013, 11/30/2014, 10/29/2016, 10/18/2017, 10/20/2018, 10/23/2019, 11/04/2020   Influenza-Unspecified 12/28/2007, 10/29/2016, 10/20/2018, 10/29/2021   Moderna Covid-19 Vaccine Bivalent Booster 67yrs & up 10/29/2021   Moderna Sars-Covid-2 Vaccination 05/12/2019, 06/09/2019, 12/29/2019   Pneumococcal Conjugate-13 04/20/2013   Pneumococcal Polysaccharide-23 12/13/2013   Td 01/27/1999   Tdap 10/03/2012   Zoster, Live 12/28/2011    TDAP status: Due, Education has been provided regarding the importance of this vaccine. Advised may receive this vaccine at local pharmacy or Health Dept. Aware to provide a copy of the vaccination record if obtained from local pharmacy or Health Dept. Verbalized acceptance and understanding.  Flu Vaccine status: Declined, Education has been provided regarding the importance of this vaccine but patient still declined. Advised may receive this vaccine at local pharmacy or Health Dept. Aware to provide a copy of the vaccination record if obtained from local pharmacy or Health Dept. Verbalized acceptance and understanding.  Pneumococcal vaccine status: Up to date  Covid-19 vaccine status: Completed vaccines  Qualifies for Shingles Vaccine? Yes   Zostavax completed No   Shingrix Completed?: No.    Education has been provided regarding the importance of this vaccine. Patient has been advised to call insurance company to determine out of pocket expense if they have not yet received this vaccine. Advised may also receive vaccine at local pharmacy or Health Dept. Verbalized acceptance and understanding.  Screening Tests Health Maintenance  Topic Date Due   COVID-19 Vaccine (5 - 2023-24 season) 12/24/2021   DEXA SCAN  03/08/2022   Colonoscopy  08/20/2022   DTaP/Tdap/Td (4 - Td or Tdap) 10/04/2022   Zoster Vaccines- Shingrix (1 of 2) 03/02/2023 (Originally 01/03/1965)   INFLUENZA VACCINE  10/07/2022   Medicare  Annual Wellness (AWV)  10/04/2023   Pneumonia Vaccine 72+ Years old  Completed   Hepatitis C Screening  Completed   HPV VACCINES  Aged Out    Health Maintenance  Health Maintenance Due  Topic Date Due   COVID-19 Vaccine (5 - 2023-24 season) 12/24/2021   DEXA SCAN  03/08/2022   Colonoscopy  08/20/2022   DTaP/Tdap/Td (4 - Td or Tdap) 10/04/2022    Colorectal cancer screening: No longer required.   Mammogram status: Completed 07/02/2022. Repeat every year  Bone Density status: Ordered declined by patient . Pt provided with contact info and advised to call to schedule appt.  Lung Cancer Screening: (Low Dose CT Chest recommended if Age 15-80 years, 20 pack-year currently smoking OR have quit w/in 15years.) does not qualify.   Lung Cancer Screening Referral: n/a  Additional Screening:  Hepatitis C Screening: does not qualify; Completed 08/14/2020  Vision Screening: Recommended annual ophthalmology exams for early  detection of glaucoma and other disorders of the eye. Is the patient up to date with their annual eye exam?  No  Who is the provider or what is the name of the office in which the patient attends annual eye exams? None referral 10/04/2022 If pt is not established with a provider, would they like to be referred to a provider to establish care? No .   Dental Screening: Recommended annual dental exams for proper oral hygiene    Community Resource Referral / Chronic Care Management: CRR required this visit?  No   CCM required this visit?  No     Plan:     I have personally reviewed and noted the following in the patient's chart:   Medical and social history Use of alcohol, tobacco or illicit drugs  Current medications and supplements including opioid prescriptions. Patient is not currently taking opioid prescriptions. Functional ability and status Nutritional status Physical activity Advanced directives List of other physicians Hospitalizations, surgeries, and  ER visits in previous 12 months Vitals Screenings to include cognitive, depression, and falls Referrals and appointments  In addition, I have reviewed and discussed with patient certain preventive protocols, quality metrics, and best practice recommendations. A written personalized care plan for preventive services as well as general preventive health recommendations were provided to patient.     Lorrene Reid, LPN   [id number]/[id number]   After Visit Summary: (MyChart) Due to this being a telephonic visit, the after visit summary with patients personalized plan was offered to patient via MyChart   Nurse Notes: none

## 2022-10-04 NOTE — Patient Instructions (Signed)
Deborah [name] , Thank you for taking time to come for your Medicare Wellness Visit. I appreciate your ongoing commitment to your health goals. Please review the following plan we discussed and let me know if I can assist you in the future.   Referrals/Orders/Follow-Ups/Clinician Recommendations: Aim for 30 minutes of exercise or brisk walking, 6-8 glasses of water, and 5 servings of fruits and vegetables each day.   This is a list of the screening recommended for you and due dates:  Health Maintenance  Topic Date Due   COVID-19 Vaccine (5 - 2023-24 season) 12/24/2021   DEXA scan (bone density measurement)  03/08/2022   Colon Cancer Screening  08/20/2022   DTaP/Tdap/Td vaccine (4 - Td or Tdap) 10/04/2022   Zoster (Shingles) Vaccine (1 of 2) 03/02/2023*   Flu Shot  10/07/2022   Medicare Annual Wellness Visit  10/04/2023   Pneumonia Vaccine  Completed   Hepatitis C Screening  Completed   HPV Vaccine  Aged Out  *Topic was postponed. The date shown is not the original due date.    Advanced directives: (ACP Link)Information on Advanced Care Planning can be found at [facility] Advance Health Care Directives Advance Health Care Directives ([email or web address]   Next Medicare Annual Wellness Visit scheduled for next year: Yes  Preventive Care 65 Years and Older, Female Preventive care refers to lifestyle choices and visits with your health care provider that can promote health and wellness. What does preventive care include? A yearly physical exam. This is also called an annual well check. Dental exams once or twice a year. Routine eye exams. Ask your health care provider how often you should have your eyes checked. Personal lifestyle choices, including: Daily care of your teeth and gums. Regular physical activity. Eating a healthy diet. Avoiding tobacco and drug use. Limiting alcohol use. Practicing safe sex. Taking low-dose aspirin every day. Taking vitamin and mineral  supplements as recommended by your health care provider. What happens during an annual well check? The services and screenings done by your health care provider during your annual well check will depend on your age, overall health, lifestyle risk factors, and family history of disease. Counseling  Your health care provider may ask you questions about your: Alcohol use. Tobacco use. Drug use. Emotional well-being. Home and relationship well-being. Sexual activity. Eating habits. History of falls. Memory and ability to understand (cognition). Work and work Astronomer. Reproductive health. Screening  You may have the following tests or measurements: Height, weight, and BMI. Blood pressure. Lipid and cholesterol levels. These may be checked every 5 years, or more frequently if you are over 72 years old. Skin check. Lung cancer screening. You may have this screening every year starting at age 35 if you have a 30-pack-year history of smoking and currently smoke or have quit within the past 15 years. Fecal occult blood test (FOBT) of the stool. You may have this test every year starting at age 50. Flexible sigmoidoscopy or colonoscopy. You may have a sigmoidoscopy every 5 years or a colonoscopy every 10 years starting at age 3. Hepatitis C blood test. Hepatitis B blood test. Sexually transmitted disease (STD) testing. Diabetes screening. This is done by checking your blood sugar (glucose) after you have not eaten for a while (fasting). You may have this done every 1-3 years. Bone density scan. This is done to screen for osteoporosis. You may have this done starting at age 82. Mammogram. This may be done every 1-2 years. Talk to  your health care provider about how often you should have regular mammograms. Talk with your health care provider about your test results, treatment options, and if necessary, the need for more tests. Vaccines  Your health care provider may recommend certain  vaccines, such as: Influenza vaccine. This is recommended every year. Tetanus, diphtheria, and acellular pertussis (Tdap, Td) vaccine. You may need a Td booster every 10 years. Zoster vaccine. You may need this after age 45. Pneumococcal 13-valent conjugate (PCV13) vaccine. One dose is recommended after age 14. Pneumococcal polysaccharide (PPSV23) vaccine. One dose is recommended after age 58. Talk to your health care provider about which screenings and vaccines you need and how often you need them. This information is not intended to replace advice given to you by your health care provider. Make sure you discuss any questions you have with your health care provider. Document Released: 03/21/2015 Document Revised: 11/12/2015 Document Reviewed: 12/24/2014 Elsevier Interactive Patient Education  2017 [employer].  Fall Prevention in the Home Falls can cause injuries. They can happen to people of all ages. There are many things you can do to make your home safe and to help prevent falls. What can I do on the outside of my home? Regularly fix the edges of walkways and driveways and fix any cracks. Remove anything that might make you trip as you walk through a door, such as a raised step or threshold. Trim any bushes or trees on the path to your home. Use bright outdoor lighting. Clear any walking paths of anything that might make someone trip, such as rocks or tools. Regularly check to see if handrails are loose or broken. Make sure that both sides of any steps have handrails. Any raised decks and porches should have guardrails on the edges. Have any leaves, snow, or ice cleared regularly. Use sand or salt on walking paths during winter. Clean up any spills in your garage right away. This includes oil or grease spills. What can I do in the bathroom? Use night lights. Install grab bars by the toilet and in the tub and shower. Do not use towel bars as grab bars. Use non-skid mats or decals in  the tub or shower. If you need to sit down in the shower, use a plastic, non-slip stool. Keep the floor dry. Clean up any water that spills on the floor as soon as it happens. Remove soap buildup in the tub or shower regularly. Attach bath mats securely with double-sided non-slip rug tape. Do not have throw rugs and other things on the floor that can make you trip. What can I do in the bedroom? Use night lights. Make sure that you have a light by your bed that is easy to reach. Do not use any sheets or blankets that are too big for your bed. They should not hang down onto the floor. Have a firm chair that has side arms. You can use this for support while you get dressed. Do not have throw rugs and other things on the floor that can make you trip. What can I do in the kitchen? Clean up any spills right away. Avoid walking on wet floors. Keep items that you use a lot in easy-to-reach places. If you need to reach something above you, use a strong step stool that has a grab bar. Keep electrical cords out of the way. Do not use floor polish or wax that makes floors slippery. If you must use wax, use non-skid floor wax. Do not  have throw rugs and other things on the floor that can make you trip. What can I do with my stairs? Do not leave any items on the stairs. Make sure that there are handrails on both sides of the stairs and use them. Fix handrails that are broken or loose. Make sure that handrails are as long as the stairways. Check any carpeting to make sure that it is firmly attached to the stairs. Fix any carpet that is loose or worn. Avoid having throw rugs at the top or bottom of the stairs. If you do have throw rugs, attach them to the floor with carpet tape. Make sure that you have a light switch at the top of the stairs and the bottom of the stairs. If you do not have them, ask someone to add them for you. What else can I do to help prevent falls? Wear shoes that: Do not have high  heels. Have rubber bottoms. Are comfortable and fit you well. Are closed at the toe. Do not wear sandals. If you use a stepladder: Make sure that it is fully opened. Do not climb a closed stepladder. Make sure that both sides of the stepladder are locked into place. Ask someone to hold it for you, if possible. Clearly mark and make sure that you can see: Any grab bars or handrails. First and last steps. Where the edge of each step is. Use tools that help you move around (mobility aids) if they are needed. These include: Canes. Walkers. Scooters. Crutches. Turn on the lights when you go into a dark area. Replace any light bulbs as soon as they burn out. Set up your furniture so you have a clear path. Avoid moving your furniture around. If any of your floors are uneven, fix them. If there are any pets around you, be aware of where they are. Review your medicines with your doctor. Some medicines can make you feel dizzy. This can increase your chance of falling. Ask your doctor what other things that you can do to help prevent falls. This information is not intended to replace advice given to you by your health care provider. Make sure you discuss any questions you have with your health care provider. Document Released: 12/19/2008 Document Revised: 07/31/2015 Document Reviewed: 03/29/2014 Elsevier Interactive Patient Education  2017 [employer].

## 2022-10-14 ENCOUNTER — Other Ambulatory Visit: Payer: Self-pay | Admitting: Family Medicine

## 2022-10-14 DIAGNOSIS — I700 Atherosclerosis of aorta: Secondary | ICD-10-CM

## 2022-10-17 ENCOUNTER — Other Ambulatory Visit: Payer: Self-pay | Admitting: Family Medicine

## 2022-10-17 DIAGNOSIS — F411 Generalized anxiety disorder: Secondary | ICD-10-CM

## 2022-10-17 DIAGNOSIS — R000 Tachycardia, unspecified: Secondary | ICD-10-CM

## 2022-10-28 ENCOUNTER — Ambulatory Visit (INDEPENDENT_AMBULATORY_CARE_PROVIDER_SITE_OTHER): Payer: Medicare Other | Admitting: Neurology

## 2022-10-28 ENCOUNTER — Encounter: Payer: Self-pay | Admitting: Neurology

## 2022-10-28 ENCOUNTER — Other Ambulatory Visit: Payer: Self-pay | Admitting: Family Medicine

## 2022-10-28 VITALS — BP 119/62 | HR 64 | Ht 66.0 in | Wt 132.0 lb

## 2022-10-28 DIAGNOSIS — G20C Parkinsonism, unspecified: Secondary | ICD-10-CM

## 2022-10-28 MED ORDER — CARBIDOPA-LEVODOPA 25-100 MG PO TABS
2.0000 | ORAL_TABLET | Freq: Three times a day (TID) | ORAL | 0 refills | Status: DC
Start: 2022-10-28 — End: 2022-11-05

## 2022-10-28 NOTE — Progress Notes (Signed)
Subjective:    Patient ID: Deborah [name] is a 77 y.o. female.  HPI    Interim history:   Deborah [name] is a 77 year old right-handed woman with an underlying medical history of seasonal allergies, reflux disease, anxiety, tachycardia, history of lung cancer, arthritis, asthma, hiatal hernia, osteopenia, and Raynaud's disease, who presents for follow-up consultation of her parkinsonism.  The patient is accompanied by her husband.  She presents after a long gap of 2 years.  I first met her at the request of her primary care nurse practitioner on 10/28/2020, at which time she reported a prior diagnosis of Parkinson's disease.  She had seen a movement disorder specialist through the [facility] clinic in 2021 and also 2022.  She was on levodopa at the time.  She had noted benefit from medication, she  was on Sinemet 2 pills 3 times daily.  She was advised to proceed with a DaTscan.  She did not pursue it.  10/28/2022: She reports feeling about the same. She has not had any new or worsening symptoms.  She is not taking any Robaxin, reports that she actually never took it, it was prescribed for back spasms.  She continues to take levodopa 2 pills 3 times daily, her primary care has been prescribing it.  She reports that she ended up not pursuing the DaTscan but will be willing to think about it again, she was somewhat afraid to pursue it.  She has very little tremor, sometimes it seems to flareup in the left upper extremity or both feet.  She is interested in tapering off levodopa at this time.  She sees the cancer doctor about twice a year or once a year, she will hopefully be able to phase out.  Her latest chest CT from January of this year showed surgical changes, no new lesions thankfully.  She has started Crestor as there was some evidence of aortic atherosclerosis.  The patient's allergies, current medications, family history, past medical history, past social history, past surgical history and problem  list were reviewed and updated as appropriate.    Previously:   10/28/20: (She) was previously diagnosed with Parkinson's disease.  She was followed at the [facility] clinic.  She had previously seen a neurologist by the name of Dr. Rubin Payor.  I was able to review a note from 04/16/2020.  At that time she was on Sinemet 25-100 mg strength 2 tablets 3 times daily.  She was in the process of tapering clonazepam and she was on Lexapro 20 mg daily.  She had originally seen the neurologist on 11/14/2019.  She reported shakiness since 2019.  She reports that her original tremor started in the left hand and was noticeable primarily when she was more anxious.  Anxiety has since then been better.  She has been on Lexapro.  She has been able to taper off clonazepam.  She is currently not utilizing BuSpar on a regular basis. She has no family history of Parkinson's disease, she denies a family history of tremors, mom lived to be 2 years old, dad lived to be 68 years old.  She has 1 older brother and 2 younger brothers, neither with tremors.  She does have a history of degenerative lumbar disc disease and had surgery in the past.  She feels that she has benefited from the levodopa and currently takes 2 pills 3 times daily, at 6 AM, 1130 or noon for the second dose and last dose at 6:15 PM.  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  They moved to [place] in March 2022 to be closer to their daughter.  Patient and daughter both live in [place].  She is a non-smoker.  She had lung cancer surgery in March 2020 with right lower lobectomy, no subsequent chemotherapy or radiation.   She was hospitalized in June 2022 with dehydration, complicated by metabolic encephalopathy. She reports no recent falls, no issues with constipation.  She tries to stay active and uses a treadmill on a regular basis.    Her Past Medical History Is Significant For: Past Medical History:  Diagnosis Date    Anxiety    Arthritis    Asthma    GERD (gastroesophageal reflux disease)    Hiatal hernia    Hyperlipidemia    Lung cancer (HCC)    Osteopenia    Parkinson's syndrome    Raynaud's disease     Her Past Surgical History Is Significant For: Past Surgical History:  Procedure Laterality Date   BACK SURGERY  1995   Decompress disc lumbar   BREAST BIOPSY  1995   CATARACT EXTRACTION, BILATERAL Bilateral 2017   COLONOSCOPY  08/20/2015   repeat in 5 years   ESOPHAGOGASTRODUODENOSCOPY  2021   LUNG REMOVAL, PARTIAL Right    REFRACTIVE SURGERY Left 08/24/2016    Her Family History Is Significant For: Family History  Problem Relation Age of Onset   Dementia Mother    Heart disease Father    Diabetes Father    Prostate cancer Father    Diabetes Brother    Acute myelogenous leukemia Brother    Parkinson's disease Neg Hx    Breast cancer Neg Hx     Her Social History Is Significant For: Social History   Socioeconomic History   Marital status: Married    Spouse name: Riley Lam   Number of children: 1   Years of education: Not on file   Highest education level: 12th grade  Occupational History   Occupation: retired  Tobacco Use   Smoking status: Former    Current packs/day: 0.00    Average packs/day: 0.3 packs/day for 3.0 years (0.8 ttl pk-yrs)    Types: Cigarettes    Start date: 07/23/1957    Quit date: 07/23/1960    Years since quitting: 62.3   Smokeless tobacco: Never  Vaping Use   Vaping status: Never Used  Substance and Sexual Activity   Alcohol use: Not Currently    Alcohol/week: 0.0 - 1.0 standard drinks of alcohol    Comment: maybe a sip here and there   Drug use: Never   Sexual activity: Yes    Partners: Male    Birth control/protection: None  Other Topics Concern   Not on file  Social History Narrative   Moved here from [place] with husband Riley Lam beginning of 2022. Their child lives nearby.      Caffeine: none   Right handed   Social Determinants of  Health   Financial Resource Strain: Low Risk  (10/04/2022)   Overall Financial Resource Strain (CARDIA)    Difficulty of Paying Living Expenses: Not hard at all  Food Insecurity: No Food Insecurity (10/04/2022)   Hunger Vital Sign    Worried About Running Out of Food in the Last Year: Never true    Ran Out of Food in the Last Year: Never true  Transportation Needs: No Transportation Needs (10/04/2022)   PRAPARE - Administrator, Civil Service (Medical): No  Lack of Transportation (Non-Medical): No  Physical Activity: Sufficiently Active (10/04/2022)   Exercise Vital Sign    Days of Exercise per Week: 5 days    Minutes of Exercise per Session: 60 min  Stress: No Stress Concern Present (10/04/2022)   [employer] of Occupational Health - Occupational Stress Questionnaire    Feeling of Stress : Not at all  Social Connections: Moderately Isolated (10/04/2022)   Social Connection and Isolation Panel [NHANES]    Frequency of Communication with Friends and Family: More than three times a week    Frequency of Social Gatherings with Friends and Family: More than three times a week    Attends Religious Services: Never    Database administrator or Organizations: No    Attends Engineer, structural: Never    Marital Status: Married    Her Allergies Are:  Allergies  Allergen Reactions   Sulfa Antibiotics Hives and Itching   Amoxicillin-Pot Clavulanate Nausea And Vomiting   Cefdinir Nausea And Vomiting and Diarrhea   Ciprofloxacin Hives and Itching   Clarithromycin Hives and Itching   Other     Bioxin    Penicillins     Pt states she isn't allergic to PCN. She was tested in [place] and they told her she was not allergic to it. She would like the allergy removed from her chart.   :   Her Current Medications Are:  Outpatient Encounter Medications as of 10/28/2022  Medication Sig   atenolol (TENORMIN) 25 MG tablet TAKE 1 TABLET (25 MG TOTAL) BY MOUTH DAILY.   Calcium  Carbonate-Vit D-Min (CALCIUM 1200 PO) Take 1,200 mg by mouth daily.   carbidopa-levodopa (SINEMET IR) 25-100 MG tablet Take 2 tablets by mouth in the morning, at noon, and at bedtime. (NEEDS TO BE SEEN BEFORE NEXT REFILL)   cetirizine (ZYRTEC) 10 MG tablet Take 10 mg by mouth daily.   cholecalciferol (VITAMIN D3) 25 MCG (1000 UNIT) tablet Take 1,000 Units by mouth daily.   escitalopram (LEXAPRO) 20 MG tablet TAKE 1 TABLET BY MOUTH EVERY DAY   rosuvastatin (CRESTOR) 5 MG tablet Take 1 tablet (5 mg total) by mouth daily.   triamcinolone (NASACORT) 55 MCG/ACT AERO nasal inhaler Place 2 sprays into the nose daily.   triamcinolone ointment (KENALOG) 0.5 % Apply 1 Application topically 2 (two) times daily. (Patient taking differently: Apply 1 Application topically 2 (two) times daily. As needed)   levocetirizine (XYZAL) 5 MG tablet Take 1 tablet (5 mg total) by mouth every evening. (Patient not taking: Reported on 10/28/2022)   methocarbamol (ROBAXIN) 500 MG tablet Take 1 tablet (500 mg total) by mouth every 8 (eight) hours as needed for muscle spasms. (Patient not taking: Reported on 10/28/2022)   No facility-administered encounter medications on file as of 10/28/2022.  :  Review of Systems:  Out of a complete 14 point review of systems, all are reviewed and negative with the exception of these symptoms as listed below:   Review of Systems  Neurological:        Patient is here with her husband for yearly Parkinsonism follow-up. Patient states she was afraid to complete the DaT scan previously. She feels her condition is the same compared to her last visit which was nearly 2 years ago. She is still taking Sinemet IR. She states she was diagnosed with PD at [facility] before seeing Dr Frances Furbish.    Objective:  Neurological Exam  Physical Exam Physical Examination:   Vitals:  10/28/22 0959  BP: 119/62  Pulse: 64    General Examination: The patient is a very pleasant 77 y.o. female in no  acute distress. She appears well-developed and well-nourished and well groomed.   HEENT: Normocephalic, atraumatic, pupils are equal, round and reactive to light and accommodation. Status post bilateral cataract repairs, extraocular tracking without limitation, no nystagmus, no significant facial masking noted.  Neck is supple, no nuchal rigidity noted.  No carotid bruits.  Airway examination reveals moderate mouth dryness.  No obvious hypophonia or dysarthria.  No voice tremor.  No tremor today.  Tongue protrudes centrally and palate elevates symmetrically.    Chest: Clear to auscultation without wheezing, rhonchi or crackles noted.   Heart: S1+S2+0, regular and normal without murmurs, rubs or gallops noted.    Abdomen: Soft, non-tender and non-distended.   Extremities: There is no pitting edema in the distal lower extremities bilaterally. Pedal pulses are intact.   Skin: Warm and dry without trophic changes noted.   Musculoskeletal: exam reveals no obvious joint deformities.    Neurologically:  Mental status: The patient is awake, alert and oriented in all 4 spheres. Her immediate and remote memory, attention, language skills and fund of knowledge are appropriate. There is no evidence of aphasia, agnosia, apraxia or anomia. Speech is clear with normal prosody and enunciation. Thought process is linear. Mood is normal and affect is normal.  Cranial nerves II - XII are as described above under HEENT exam. In addition: shoulder shrug is normal with equal shoulder height noted. Motor exam: Normal bulk, strength and tone is noted. There is no resting tremor, no significant postural or action tremor, with the exception of a very subtle left hand postural tremor noted.    Fine motor skills and coordination: intact in the upper and lower extremities without obvious lateralization.  Cerebellar testing: No dysmetria or intention tremor on finger to nose testing. Heel to shin is unremarkable  bilaterally. There is no truncal or gait ataxia.  Sensory exam: intact to light touch in the upper and lower extremities.  Gait, station and balance: She stands easily. No veering to one side is noted. No leaning to one side is noted. Posture is age-appropriate and stance is narrow based. Gait shows normal stride length and normal pace and preserved arm swing, no shuffling, no freezing.    Assessment and Plan:   In summary, Deborah [name] is a very pleasant 77 year old right-handed woman with an underlying medical history of seasonal allergies, reflux disease, anxiety, tachycardia, history of lung cancer, arthritis, asthma, hiatal hernia, osteopenia, and Raynaud's disease, who presents for follow-up consultation of her parkinsonism, after a gap of 2 years. She was diagnosed with Parkinson's disease by a neurologist at the [facility] clinic in September 2021 and has been on levodopa since then.  She has benefited from treatment.  She has felt stable.  Exam is benign, currently, I do not see any telltale signs of parkinsonism.  Some of her symptoms can certainly be masked by taking medication.  She is encouraged to think about the DaTscan.  She is interested in tapering off of levodopa.  We mutually agreed to take her off of it gradually by reducing it by 1 pill every 3 days.  She currently takes 2 pills 3 times daily, she will take a total of 5 pills every day for the next 3 days, then 4 pills every day for the next 3 days and so forth until she has completely stopped.  She has  some pills at home, she is not sure if she needs a refill to be able to taper off completely but she can certainly let us know, we can provide a bridge prescription so she can taper off as per plan.  She is advised to let us know if she would like to proceed with a DaTscan, I offered an order today but she would like to think about it.  She is advised to follow-up in this clinic to see one of our nurse practitioners routinely for  checkup in 6 months, sooner if needed.  I answered all their questions today and the patient and her husband were in agreement.   I spent 30 minutes in total face-to-face time and in reviewing records during pre-charting, more than 50% of which was spent in counseling and coordination of care, reviewing test results, reviewing medications and treatment regimen and/or in discussing or reviewing the diagnosis of parkinsonism, the prognosis and treatment options. Pertinent laboratory and imaging test results that were available during this visit with the patient were reviewed by me and considered in my medical decision making (see chart for details).

## 2022-10-28 NOTE — Patient Instructions (Signed)
It was nice to see you both again today.  As discussed, we will taper you off the levodopa slowly, to that and you will take:  1-2-2 pills daily for 3 days, then 1-2-1 pills daily for the next 3 days, then 1 pill 3 times a day for another 3 days, then 0-1-1 pills daily for the next 3 days, then 0-0-1 pill daily for 3 days, then stop.  Let us know if he needed a bridge prescription to taper off the medicine.  Please follow-up to see one of our nurse practitioners routinely in 6 months in this clinic, sooner if needed.  Let us know if you would like to proceed with a DaTscan and I would be happy to put a new order in.

## 2022-10-28 NOTE — Telephone Encounter (Signed)
Spoke to pt aware of refill sent to pharmacy

## 2022-10-28 NOTE — Telephone Encounter (Signed)
Please advise if this refill is ok?

## 2022-11-05 ENCOUNTER — Other Ambulatory Visit: Payer: Self-pay | Admitting: Neurology

## 2022-11-05 DIAGNOSIS — G20C Parkinsonism, unspecified: Secondary | ICD-10-CM

## 2022-11-05 NOTE — Telephone Encounter (Signed)
Supply changed. Diagnoses added per pharmacy request.

## 2022-11-09 ENCOUNTER — Encounter: Payer: Self-pay | Admitting: Family Medicine

## 2022-12-30 ENCOUNTER — Other Ambulatory Visit (INDEPENDENT_AMBULATORY_CARE_PROVIDER_SITE_OTHER): Payer: Medicare Other

## 2022-12-30 ENCOUNTER — Encounter: Payer: Self-pay | Admitting: Family Medicine

## 2022-12-30 ENCOUNTER — Ambulatory Visit (INDEPENDENT_AMBULATORY_CARE_PROVIDER_SITE_OTHER): Payer: Medicare Other | Admitting: Family Medicine

## 2022-12-30 VITALS — BP 108/64 | HR 59 | Temp 98.1°F | Ht 66.0 in | Wt 129.0 lb

## 2022-12-30 DIAGNOSIS — R251 Tremor, unspecified: Secondary | ICD-10-CM

## 2022-12-30 DIAGNOSIS — D696 Thrombocytopenia, unspecified: Secondary | ICD-10-CM

## 2022-12-30 DIAGNOSIS — I700 Atherosclerosis of aorta: Secondary | ICD-10-CM

## 2022-12-30 DIAGNOSIS — R000 Tachycardia, unspecified: Secondary | ICD-10-CM | POA: Diagnosis not present

## 2022-12-30 DIAGNOSIS — Z780 Asymptomatic menopausal state: Secondary | ICD-10-CM | POA: Insufficient documentation

## 2022-12-30 DIAGNOSIS — F411 Generalized anxiety disorder: Secondary | ICD-10-CM

## 2022-12-30 DIAGNOSIS — Z0000 Encounter for general adult medical examination without abnormal findings: Secondary | ICD-10-CM

## 2022-12-30 DIAGNOSIS — M8580 Other specified disorders of bone density and structure, unspecified site: Secondary | ICD-10-CM

## 2022-12-30 MED ORDER — ATENOLOL 25 MG PO TABS: 25.0000 mg | ORAL_TABLET | Freq: Every day | ORAL | 3 refills | Status: DC

## 2022-12-30 MED ORDER — ESCITALOPRAM OXALATE 20 MG PO TABS: 20.0000 mg | ORAL_TABLET | Freq: Every day | ORAL | 3 refills | Status: DC

## 2022-12-30 MED ORDER — ROSUVASTATIN CALCIUM 5 MG PO TABS: 5.0000 mg | ORAL_TABLET | Freq: Every day | ORAL | 3 refills | Status: DC

## 2022-12-30 NOTE — Patient Instructions (Signed)

## 2022-12-30 NOTE — Progress Notes (Signed)
Complete physical exam  Patient: Deborah [name]   DOB: 1946-01-01   77 y.o. Female  MRN: [id number]  Subjective:    Chief Complaint  Patient presents with   Annual Exam    Deborah [name] is a 77 y.o. female who presents today for a complete physical exam. She reports consuming a general diet. Home exercise routine includes treadmill, walking for 50 minutes a day, squats. She generally feels well. She reports sleeping well. She does not have additional problems to discuss today.   [place] has seen neurology since her last visit and has weaned off of lovodopa. She has noticed a mild tremor with intention in her hands that occurs primarily when stressed. She will follow up with neurology in the next few months.     Most recent fall risk assessment:    12/30/2022    7:53 AM  Fall Risk   Falls in the past year? 0     Most recent depression screenings:    12/30/2022    7:54 AM 10/04/2022    8:54 AM 11/30/2021    8:10 AM  Depression screen PHQ 2/9  Decreased Interest 0 0 0  Down, Depressed, Hopeless 0 0 0  PHQ - 2 Score 0 0 0  Altered sleeping 0  0  Tired, decreased energy 0  0  Change in appetite 0  0  Feeling bad or failure about yourself  0  0  Trouble concentrating 0  0  Moving slowly or fidgety/restless 0  0  Suicidal thoughts 0  0  PHQ-9 Score 0  0  Difficult doing work/chores Not difficult at all  Not difficult at all      12/30/2022    7:54 AM 11/30/2021    8:11 AM 10/30/2021    8:26 AM 11/04/2020    9:54 AM  GAD 7 : Generalized Anxiety Score  Nervous, Anxious, on Edge 0 0 0 0  Control/stop worrying 0 0 0 0  Worry too much - different things 0 0 0 0  Trouble relaxing 0 0 0 0  Restless 0 0 0 0  Easily annoyed or irritable 0 0 0 0  Afraid - awful might happen 0 0 0 0  Total GAD 7 Score 0 0 0 0  Anxiety Difficulty Not difficult at all Not difficult at all Not difficult at all Not difficult at all      Vision:Not within last year   Past Medical  History:  Diagnosis Date   Anxiety    Arthritis    Asthma    GERD (gastroesophageal reflux disease)    Hiatal hernia    Hyperlipidemia    Lung cancer (HCC)    Osteopenia    Parkinson's syndrome (HCC)    Raynaud's disease       Patient Care Team: Gabriel Earing, FNP as PCP - General (Family Medicine) Therese Sarah, RN as Oncology Nurse Navigator (Oncology) Doreatha Massed, MD as Medical Oncologist (Oncology)   Outpatient Medications Prior to Visit  Medication Sig   atenolol (TENORMIN) 25 MG tablet TAKE 1 TABLET (25 MG TOTAL) BY MOUTH DAILY.   Calcium Carbonate-Vit D-Min (CALCIUM 1200 PO) Take 1,200 mg by mouth daily.   carbidopa-levodopa (SINEMET IR) 25-100 MG tablet TAKE 2 TABLETS BY MOUTH IN THE MORNING, AT NOON, AND AT BEDTIME. PLEASE FOLLOW TAPER INSTRUCTIONS FOR MEDICATION ON AFTER VISIT SUMMARY   cetirizine (ZYRTEC) 10 MG tablet Take 10 mg by mouth daily.   cholecalciferol (VITAMIN D3)  25 MCG (1000 UNIT) tablet Take 1,000 Units by mouth daily.   escitalopram (LEXAPRO) 20 MG tablet TAKE 1 TABLET BY MOUTH EVERY DAY   rosuvastatin (CRESTOR) 5 MG tablet Take 1 tablet (5 mg total) by mouth daily.   [DISCONTINUED] levocetirizine (XYZAL) 5 MG tablet Take 1 tablet (5 mg total) by mouth every evening.   [DISCONTINUED] methocarbamol (ROBAXIN) 500 MG tablet Take 1 tablet (500 mg total) by mouth every 8 (eight) hours as needed for muscle spasms.   [DISCONTINUED] triamcinolone (NASACORT) 55 MCG/ACT AERO nasal inhaler Place 2 sprays into the nose daily.   [DISCONTINUED] triamcinolone ointment (KENALOG) 0.5 % Apply 1 Application topically 2 (two) times daily. (Patient taking differently: Apply 1 Application topically 2 (two) times daily. As needed)   No facility-administered medications prior to visit.    ROS Negative unless specially indicated above in HPI.     Objective:     BP 108/64   Pulse (!) 59   Temp 98.1 F (36.7 C) (Temporal)   Ht 5\' 6"  (1.676 m)   Wt 129  lb (58.5 kg)   SpO2 100%   BMI 20.82 kg/m    Physical Exam Vitals and nursing note reviewed.  Constitutional:      General: She is not in acute distress.    Appearance: Normal appearance. She is not ill-appearing.  HENT:     Head: Normocephalic and atraumatic.     Right Ear: Tympanic membrane, ear canal and external ear normal.     Left Ear: Tympanic membrane, ear canal and external ear normal.     Nose: Nose normal.     Mouth/Throat:     Mouth: Mucous membranes are dry.     Pharynx: Oropharynx is clear.  Eyes:     Extraocular Movements: Extraocular movements intact.     Conjunctiva/sclera: Conjunctivae normal.     Pupils: Pupils are equal, round, and reactive to light.  Neck:     Thyroid: No thyroid mass, thyromegaly or thyroid tenderness.  Cardiovascular:     Rate and Rhythm: Normal rate and regular rhythm.     Pulses: Normal pulses.     Heart sounds: Normal heart sounds. No murmur heard.    No friction rub. No gallop.  Pulmonary:     Effort: Pulmonary effort is normal.     Breath sounds: Normal breath sounds.  Abdominal:     General: Bowel sounds are normal. There is no distension.     Palpations: Abdomen is soft. There is no mass.     Tenderness: There is no abdominal tenderness. There is no guarding.  Musculoskeletal:        General: No swelling or tenderness.     Cervical back: Normal range of motion and neck supple. No tenderness.     Right lower leg: No edema.     Left lower leg: No edema.  Skin:    General: Skin is warm and dry.     Capillary Refill: Capillary refill takes less than 2 seconds.     Findings: No lesion or rash.  Neurological:     General: No focal deficit present.     Mental Status: She is alert and oriented to person, place, and time.     Cranial Nerves: No cranial nerve deficit.     Motor: No weakness.     Coordination: Coordination normal.     Gait: Gait normal.  Psychiatric:        Mood and Affect: Mood normal.  Behavior:  Behavior normal.        Thought Content: Thought content normal.        Judgment: Judgment normal.      No results found for any visits on 12/30/22.     Assessment & Plan:    Routine Health Maintenance and Physical Exam  Deborah "Pam" was seen today for annual exam.  Diagnoses and all orders for this visit:  Routine general medical examination at a health care facility  Generalized anxiety disorder Well controlled on current regimen.  -     escitalopram (LEXAPRO) 20 MG tablet; Take 1 tablet (20 mg total) by mouth daily.  Tachycardia Well controlled on current regimen. Fasting labs pending.  -     atenolol (TENORMIN) 25 MG tablet; Take 1 tablet (25 mg total) by mouth daily. -     CBC with Differential/Platelet -     CMP14+EGFR -     TSH  Aortic atherosclerosis (HCC) Fasting lipid panel today. Continue statin.  -     rosuvastatin (CRESTOR) 5 MG tablet; Take 1 tablet (5 mg total) by mouth daily. -     Lipid panel  Postmenopausal -     DG [facility] DEXA  Osteopenia, unspecified location Dexa today.   Tremor Continue follow up with neurology.   Thrombocytopenia CBC pending.    Immunization History  Administered Date(s) Administered   DT (Pediatric) 01/27/1999   Fluad Quad(high Dose 65+) 11/03/2020   Influenza Split 10/23/2019   Influenza, High Dose Seasonal PF 11/08/2013, 11/30/2014, 10/29/2016, 10/18/2017, 10/20/2018, 10/23/2019, 11/04/2020, 11/07/2022   Influenza,trivalent, recombinat, inj, PF 11/07/2022   Influenza-Unspecified 12/28/2007, 10/29/2016, 10/20/2018, 10/29/2021   Moderna Covid-19 Fall Seasonal Vaccine 68yrs & older 11/07/2022   Moderna Covid-19 Vaccine Bivalent Booster 52yrs & up 10/29/2021   Moderna Sars-Covid-2 Vaccination 05/12/2019, 06/09/2019, 12/29/2019, 11/07/2022   Pneumococcal Conjugate-13 04/20/2013   Pneumococcal Polysaccharide-23 12/13/2013   RSV,unspecified 11/23/2022   Td 01/27/1999   Tdap 10/03/2012, 11/07/2022   Zoster, Live  12/28/2011    Health Maintenance  Topic Date Due   DEXA SCAN  03/08/2022   COVID-19 Vaccine (6 - 2023-24 season) 01/02/2023   Zoster Vaccines- Shingrix (1 of 2) 03/02/2023 (Originally 01/03/1965)   Medicare Annual Wellness (AWV)  10/04/2023   Colonoscopy  08/19/2025   DTaP/Tdap/Td (5 - Td or Tdap) 11/06/2032   Pneumonia Vaccine 71+ Years old  Completed   INFLUENZA VACCINE  Completed   Hepatitis C Screening  Completed   HPV VACCINES  Aged Out    Discussed health benefits of physical activity, and encouraged her to engage in regular exercise appropriate for her age and condition.  Problem List Items Addressed This Visit       Cardiovascular and Mediastinum   Aortic atherosclerosis (HCC)   Relevant Medications   atenolol (TENORMIN) 25 MG tablet   rosuvastatin (CRESTOR) 5 MG tablet   Other Relevant Orders   Lipid panel     Musculoskeletal and Integument   Osteopenia     Other   Generalized anxiety disorder   Relevant Medications   escitalopram (LEXAPRO) 20 MG tablet   Tachycardia   Relevant Medications   atenolol (TENORMIN) 25 MG tablet   Other Relevant Orders   CBC with Differential/Platelet   CMP14+EGFR   TSH   Postmenopausal   Relevant Orders   DG [facility] DEXA   Other Visit Diagnoses     Routine general medical examination at a health care facility    -  Primary   Tremor  Return in about 1 year (around 12/30/2023) for CPE.   The patient indicates understanding of these issues and agrees with the plan.  Gabriel Earing, FNP

## 2022-12-31 ENCOUNTER — Other Ambulatory Visit: Payer: Self-pay | Admitting: Family Medicine

## 2022-12-31 DIAGNOSIS — M8580 Other specified disorders of bone density and structure, unspecified site: Secondary | ICD-10-CM

## 2022-12-31 LAB — CMP14+EGFR
ALT: 20 [IU]/L (ref 0–32)
AST: 27 [IU]/L (ref 0–40)
Albumin: 4.9 g/dL — ABNORMAL HIGH (ref 3.8–4.8)
Alkaline Phosphatase: 63 [IU]/L (ref 44–121)
BUN/Creatinine Ratio: 16 (ref 12–28)
BUN: 14 mg/dL (ref 8–27)
Bilirubin Total: 0.6 mg/dL (ref 0.0–1.2)
CO2: 25 mmol/L (ref 20–29)
Calcium: 9.9 mg/dL (ref 8.7–10.3)
Chloride: 102 mmol/L (ref 96–106)
Creatinine, Ser: 0.9 mg/dL (ref 0.57–1.00)
Globulin, Total: 2.4 g/dL (ref 1.5–4.5)
Glucose: 87 mg/dL (ref 70–99)
Potassium: 4.4 mmol/L (ref 3.5–5.2)
Sodium: 141 mmol/L (ref 134–144)
Total Protein: 7.3 g/dL (ref 6.0–8.5)
eGFR: 67 mL/min/{1.73_m2} (ref >59)

## 2022-12-31 LAB — CBC WITH DIFFERENTIAL/PLATELET
Basophils Absolute: 0.1 10*3/uL (ref 0.0–0.2)
Basos: 1 %
EOS (ABSOLUTE): 0.2 10*3/uL (ref 0.0–0.4)
Eos: 4 %
Hematocrit: 44.5 % (ref 34.0–46.6)
Hemoglobin: 14 g/dL (ref 11.1–15.9)
Immature Grans (Abs): 0 10*3/uL (ref 0.0–0.1)
Immature Granulocytes: 0 %
Lymphocytes Absolute: 1.6 10*3/uL (ref 0.7–3.1)
Lymphs: 29 %
MCH: 31 pg (ref 26.6–33.0)
MCHC: 31.5 g/dL (ref 31.5–35.7)
MCV: 99 fL — ABNORMAL HIGH (ref 79–97)
Monocytes Absolute: 0.6 10*3/uL (ref 0.1–0.9)
Monocytes: 10 %
Neutrophils Absolute: 3.1 10*3/uL (ref 1.4–7.0)
Neutrophils: 56 %
Platelets: 345 10*3/uL (ref 150–450)
RBC: 4.52 x10E6/uL (ref 3.77–5.28)
RDW: 12.5 % (ref 11.7–15.4)
WBC: 5.6 10*3/uL (ref 3.4–10.8)

## 2022-12-31 LAB — LIPID PANEL
Chol/HDL Ratio: 2.4 ratio (ref 0.0–4.4)
Cholesterol, Total: 150 mg/dL (ref 100–199)
HDL: 63 mg/dL (ref >39)
LDL Chol Calc (NIH): 71 mg/dL (ref 0–99)
Triglycerides: 86 mg/dL (ref 0–149)
VLDL Cholesterol Cal: 16 mg/dL (ref 5–40)

## 2022-12-31 LAB — TSH: TSH: 2.45 u[IU]/mL (ref 0.450–4.500)

## 2022-12-31 MED ORDER — ALENDRONATE SODIUM 70 MG PO TABS: 70.0000 mg | ORAL_TABLET | ORAL | 3 refills | Status: DC

## 2023-03-15 ENCOUNTER — Ambulatory Visit (HOSPITAL_COMMUNITY)
Admission: RE | Admit: 2023-03-15 | Discharge: 2023-03-15 | Disposition: A | Discharge status: Home / Self Care | Payer: Medicare Other | Source: Ambulatory Visit | Attending: Hematology | Admitting: Hematology

## 2023-03-15 ENCOUNTER — Inpatient Hospital Stay: Payer: Medicare Other | Attending: Hematology

## 2023-03-15 DIAGNOSIS — F419 Anxiety disorder, unspecified: Secondary | ICD-10-CM | POA: Insufficient documentation

## 2023-03-15 DIAGNOSIS — Z882 Allergy status to sulfonamides status: Secondary | ICD-10-CM | POA: Insufficient documentation

## 2023-03-15 DIAGNOSIS — Z79899 Other long term (current) drug therapy: Secondary | ICD-10-CM | POA: Insufficient documentation

## 2023-03-15 DIAGNOSIS — C3431 Malignant neoplasm of lower lobe, right bronchus or lung: Secondary | ICD-10-CM | POA: Insufficient documentation

## 2023-03-15 DIAGNOSIS — Z87891 Personal history of nicotine dependence: Secondary | ICD-10-CM | POA: Insufficient documentation

## 2023-03-15 DIAGNOSIS — C3491 Malignant neoplasm of unspecified part of right bronchus or lung: Secondary | ICD-10-CM | POA: Insufficient documentation

## 2023-03-15 DIAGNOSIS — Z8042 Family history of malignant neoplasm of prostate: Secondary | ICD-10-CM | POA: Insufficient documentation

## 2023-03-15 DIAGNOSIS — Z881 Allergy status to other antibiotic agents status: Secondary | ICD-10-CM | POA: Insufficient documentation

## 2023-03-15 DIAGNOSIS — Z833 Family history of diabetes mellitus: Secondary | ICD-10-CM | POA: Insufficient documentation

## 2023-03-15 DIAGNOSIS — Z880 Allergy status to penicillin: Secondary | ICD-10-CM | POA: Insufficient documentation

## 2023-03-15 DIAGNOSIS — Z8249 Family history of ischemic heart disease and other diseases of the circulatory system: Secondary | ICD-10-CM | POA: Insufficient documentation

## 2023-03-15 DIAGNOSIS — M8580 Other specified disorders of bone density and structure, unspecified site: Secondary | ICD-10-CM | POA: Insufficient documentation

## 2023-03-15 DIAGNOSIS — Z818 Family history of other mental and behavioral disorders: Secondary | ICD-10-CM | POA: Insufficient documentation

## 2023-03-15 LAB — CBC WITH DIFFERENTIAL/PLATELET
Abs Immature Granulocytes: 0.02 10*3/uL (ref 0.00–0.07)
Basophils Absolute: 0.1 10*3/uL (ref 0.0–0.1)
Basophils Relative: 1 %
Eosinophils Absolute: 0.3 10*3/uL (ref 0.0–0.5)
Eosinophils Relative: 4 %
HCT: 40.7 % (ref 36.0–46.0)
Hemoglobin: 12.8 g/dL (ref 12.0–15.0)
Immature Granulocytes: 0 %
Lymphocytes Relative: 29 %
Lymphs Abs: 2.4 10*3/uL (ref 0.7–4.0)
MCH: 30.1 pg (ref 26.0–34.0)
MCHC: 31.4 g/dL (ref 30.0–36.0)
MCV: 95.8 fL (ref 80.0–100.0)
Monocytes Absolute: 0.8 10*3/uL (ref 0.1–1.0)
Monocytes Relative: 9 %
Neutro Abs: 4.6 10*3/uL (ref 1.7–7.7)
Neutrophils Relative %: 57 %
Platelets: 302 10*3/uL (ref 150–400)
RBC: 4.25 MIL/uL (ref 3.87–5.11)
RDW: 14.1 % (ref 11.5–15.5)
WBC: 8.2 10*3/uL (ref 4.0–10.5)
nRBC: 0 % (ref 0.0–0.2)

## 2023-03-15 LAB — COMPREHENSIVE METABOLIC PANEL
ALT: 23 U/L (ref 0–44)
AST: 33 U/L (ref 15–41)
Albumin: 4.3 g/dL (ref 3.5–5.0)
Alkaline Phosphatase: 55 U/L (ref 38–126)
Anion gap: 8 (ref 5–15)
BUN: 24 mg/dL — ABNORMAL HIGH (ref 8–23)
CO2: 27 mmol/L (ref 22–32)
Calcium: 9.6 mg/dL (ref 8.9–10.3)
Chloride: 100 mmol/L (ref 98–111)
Creatinine, Ser: 0.73 mg/dL (ref 0.44–1.00)
GFR, Estimated: >60 mL/min (ref >60)
Glucose, Bld: 86 mg/dL (ref 70–99)
Potassium: 3.6 mmol/L (ref 3.5–5.1)
Sodium: 135 mmol/L (ref 135–145)
Total Bilirubin: 0.3 mg/dL (ref 0.0–1.2)
Total Protein: 7.3 g/dL (ref 6.5–8.1)

## 2023-03-21 NOTE — Progress Notes (Signed)
 [facility] [address] [place] [address]    Clinic Day:  03/22/2023  Referring physician: Joesph Annabella HERO, FNP  Patient Care Team: Joesph Annabella HERO, FNP as PCP - General (Family Medicine) Celestia Joesph SQUIBB, RN as Oncology Nurse Navigator (Oncology) Rogers Hai, MD as Medical Oncologist (Oncology)   ASSESSMENT & PLAN:   Assessment: 1.  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 - Right lower lobectomy in March 2020, 1.2 cm primary. - Did not require any adjuvant chemotherapy. - Moved to [place] area in April 2022 to be closer to her daughter. - Hospitalized in June for sepsis.  CT scan of the chest, abdomen and pelvis with contrast on 08/15/2020 with no definite evidence of recurrence of malignancy. 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  2.  Social/family history: - Lives at home with her husband.  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  Smoked for 3 years, 3 to 4 cigarettes/day in her 39s. - Father had prostate cancer.  Brother was recently diagnosed with AML.   Plan: 1.  Stage I right lower lobe lung adenocarcinoma: - She denies any lung infections in the last 1 year. - Labs from 03/15/2023: Normal LFTs and CBC. - CT chest without contrast on 03/15/2023 reviewed by me did not show any significant changes.  Will wait for final radiology report. - RTC 1 year for follow-up with repeat CT chest and labs.  Orders Placed This Encounter  Procedures   CT Chest Wo Contrast    Standing Status:   Future    Expected Date:   03/12/2024    Expiration Date:   03/21/2024    Preferred imaging location?:   [facility]      I,Helena R Teague,acting as a scribe for Hai Rogers, MD.,have documented all  relevant documentation on the behalf of Hai Rogers, MD,as directed by  Hai Rogers, MD while in the presence of Hai Rogers, MD.   I, Hai Rogers MD, have reviewed the above documentation for accuracy and completeness, and I agree with the above.   Hai Rogers, MD   1/14/202512:48 PM  CHIEF COMPLAINT:   Diagnosis: Adenocarcinoma of right lung   Cancer Staging  Adenocarcinoma of right lung [facility]) Staging form: Lung, AJCC 8th Edition - Clinical stage from 11/09/2020: ycT1, cN0, cM0 - Signed by Rogers Hai, MD on 11/09/2020    Prior Therapy: Right lower lobectomy in March 2020   Current Therapy:  Surveillance   HISTORY OF PRESENT ILLNESS:   Oncology History  Adenocarcinoma of right lung (HCC)  11/09/2020 Initial Diagnosis   Adenocarcinoma of right lung (HCC)   11/09/2020 Cancer Staging   Staging form: Lung, AJCC 8th Edition - Clinical stage from 11/09/2020: ycT1, cN0, cM0 - Signed by Rogers Hai, MD on 11/09/2020 Stage prefix: Post-therapy Response to neoadjuvant therapy: Complete response      INTERVAL HISTORY:   [place] is a 78 y.o. female presenting to clinic today for follow up of adenocarcinoma of right lung. She was last seen by me on 03/23/22.  Since her last visit, she underwent CT chest on 03/15/23. She had a DEXA scan done on 12/30/22 and was found to be osteopenic.  Today, she states that she is doing well overall. Her  appetite level is at 100%. Her energy level is at 80%. She is accompanied by her husband. She denies any recent respiratory infections, including pneumonia or flu. She does report a sinus infection.   PAST MEDICAL HISTORY:   Past Medical History: Past Medical History:  Diagnosis Date   Anxiety    Arthritis    Asthma    GERD (gastroesophageal reflux disease)    Hiatal hernia    Hyperlipidemia    Lung cancer (HCC)    Osteopenia    Parkinson's syndrome (HCC)    Raynaud's disease     Surgical  History: Past Surgical History:  Procedure Laterality Date   BACK SURGERY  1995   Decompress disc lumbar   BREAST BIOPSY  1995   CATARACT EXTRACTION, BILATERAL Bilateral 2017   COLONOSCOPY  08/20/2015   repeat in 5 years   ESOPHAGOGASTRODUODENOSCOPY  2021   LUNG REMOVAL, PARTIAL Right    REFRACTIVE SURGERY Left 08/24/2016    Social History: Social History   Socioeconomic History   Marital status: Married    Spouse name: Vicenta   Number of children: 1   Years of education: Not on file   Highest education level: 12th grade  Occupational History   Occupation: retired  Tobacco Use   Smoking status: Former    Current packs/day: 0.00    Average packs/day: 0.3 packs/day for 3.0 years (0.8 ttl pk-yrs)    Types: Cigarettes    Start date: 07/23/1957    Quit date: 07/23/1960    Years since quitting: 62.7   Smokeless tobacco: Never  Vaping Use   Vaping status: Never Used  Substance and Sexual Activity   Alcohol use: Not Currently    Alcohol/week: 0.0 - 1.0 standard drinks of alcohol    Comment: maybe a sip here and there   Drug use: Never   Sexual activity: Yes    Partners: Male    Birth control/protection: None  Other Topics Concern   Not on file  Social History Narrative   Moved here from [place]  with husband Vicenta beginning of 2022. Their child lives nearby.      Caffeine: none   Right handed   Social Drivers of Corporate Investment Banker Strain: Low Risk  (12/24/2022)   Overall Financial Resource Strain (CARDIA)    Difficulty of Paying Living Expenses: Not hard at all  Food Insecurity: No Food Insecurity (12/24/2022)   Hunger Vital Sign    Worried About Running Out of Food in the Last Year: Never true    Ran Out of Food in the Last Year: Never true  Transportation Needs: No Transportation Needs (12/24/2022)   PRAPARE - Administrator, Civil Service (Medical): No    Lack of Transportation (Non-Medical): No  Physical Activity: Sufficiently Active  (12/24/2022)   Exercise Vital Sign    Days of Exercise per Week: 5 days    Minutes of Exercise per Session: 60 min  Stress: No Stress Concern Present (12/24/2022)   [employer] of Occupational Health - Occupational Stress Questionnaire    Feeling of Stress : Not at all  Social Connections: Unknown (12/24/2022)   Social Connection and Isolation Panel [NHANES]    Frequency of Communication with Friends and Family: More than three times a week    Frequency of Social Gatherings with Friends and Family: More than three times a week    Attends Religious Services: Patient declined    Database Administrator or Organizations: No  Attends Banker Meetings: Never    Marital Status: Married  Recent Concern: Social Connections - Moderately Isolated (10/04/2022)   Social Connection and Isolation Panel [NHANES]    Frequency of Communication with Friends and Family: More than three times a week    Frequency of Social Gatherings with Friends and Family: More than three times a week    Attends Religious Services: Never    Database Administrator or Organizations: No    Attends Banker Meetings: Never    Marital Status: Married  Catering Manager Violence: Not At Risk (10/04/2022)   Humiliation, Afraid, Rape, and Kick questionnaire    Fear of Current or Ex-Partner: No    Emotionally Abused: No    Physically Abused: No    Sexually Abused: No    Family History: Family History  Problem Relation Age of Onset   Dementia Mother    Heart disease Father    Diabetes Father    Prostate cancer Father    Diabetes Brother    Acute myelogenous leukemia Brother    Parkinson's disease Neg Hx    Breast cancer Neg Hx     Current Medications:  Current Outpatient Medications:    alendronate  (FOSAMAX ) 70 MG tablet, Take 1 tablet (70 mg total) by mouth every 7 (seven) days. Take with a full glass of water on an empty stomach., Disp: 12 tablet, Rfl: 3   atenolol  (TENORMIN ) 25 MG  tablet, Take 1 tablet (25 mg total) by mouth daily., Disp: 90 tablet, Rfl: 3   Calcium  Carbonate-Vit D-Min (CALCIUM  1200 PO), Take 1,200 mg by mouth daily., Disp: , Rfl:    cetirizine (ZYRTEC) 10 MG tablet, Take 10 mg by mouth daily., Disp: , Rfl:    cholecalciferol (VITAMIN D3) 25 MCG (1000 UNIT) tablet, Take 1,000 Units by mouth daily., Disp: , Rfl:    escitalopram  (LEXAPRO ) 20 MG tablet, Take 1 tablet (20 mg total) by mouth daily., Disp: 90 tablet, Rfl: 3   rosuvastatin  (CRESTOR ) 5 MG tablet, Take 1 tablet (5 mg total) by mouth daily., Disp: 90 tablet, Rfl: 3   Allergies: Allergies  Allergen Reactions   Sulfa Antibiotics Hives and Itching   Amoxicillin-Pot Clavulanate Nausea And Vomiting   Cefdinir Nausea And Vomiting and Diarrhea   Ciprofloxacin Hives and Itching   Clarithromycin Hives and Itching   Other     Bioxin    Penicillins     Pt states she isn't allergic to PCN. She was tested in [place]  and they told her she was not allergic to it. She would like the allergy removed from her chart.     REVIEW OF SYSTEMS:   Review of Systems  Constitutional:  Negative for chills, fatigue and fever.  HENT:   Negative for lump/mass, mouth sores, nosebleeds, sore throat and trouble swallowing.   Eyes:  Negative for eye problems.  Respiratory:  Negative for cough and shortness of breath.   Cardiovascular:  Negative for chest pain, leg swelling and palpitations.  Gastrointestinal:  Negative for abdominal pain, constipation, diarrhea, nausea and vomiting.  Genitourinary:  Negative for bladder incontinence, difficulty urinating, dysuria, frequency, hematuria and nocturia.   Musculoskeletal:  Negative for arthralgias, back pain, flank pain, myalgias and neck pain.  Skin:  Negative for itching and rash.  Neurological:  Positive for headaches. Negative for dizziness and numbness.  Hematological:  Does not bruise/bleed easily.  Psychiatric/Behavioral:  Negative for depression, sleep disturbance and  suicidal ideas. The patient is not nervous/anxious.  All other systems reviewed and are negative.    VITALS:   Blood pressure (!) 122/57, pulse 68, temperature 97.6 F (36.4 C), temperature source Oral, resp. rate 17, weight 132 lb 11.5 oz (60.2 kg), SpO2 98%.  Wt Readings from Last 3 Encounters:  03/22/23 132 lb 11.5 oz (60.2 kg)  12/30/22 129 lb (58.5 kg)  10/28/22 132 lb (59.9 kg)    Body mass index is 21.42 kg/m.  Performance status (ECOG): 1 - Symptomatic but completely ambulatory  PHYSICAL EXAM:   Physical Exam Vitals and nursing note reviewed. Exam conducted with a chaperone present.  Constitutional:      Appearance: Normal appearance.  Cardiovascular:     Rate and Rhythm: Normal rate and regular rhythm.     Pulses: Normal pulses.     Heart sounds: Normal heart sounds.  Pulmonary:     Effort: Pulmonary effort is normal.     Breath sounds: Normal breath sounds.  Abdominal:     Palpations: Abdomen is soft. There is no hepatomegaly, splenomegaly or mass.     Tenderness: There is no abdominal tenderness.  Musculoskeletal:     Right lower leg: No edema.     Left lower leg: No edema.  Lymphadenopathy:     Cervical: No cervical adenopathy.     Right cervical: No superficial, deep or posterior cervical adenopathy.    Left cervical: No superficial, deep or posterior cervical adenopathy.     Upper Body:     Right upper body: No supraclavicular or axillary adenopathy.     Left upper body: No supraclavicular or axillary adenopathy.  Neurological:     General: No focal deficit present.     Mental Status: She is alert and oriented to person, place, and time.  Psychiatric:        Mood and Affect: Mood normal.        Behavior: Behavior normal.     LABS:   CBC     Component Value Date/Time   WBC 8.2 03/15/2023 1211   RBC 4.25 03/15/2023 1211   HGB 12.8 03/15/2023 1211   HGB 14.0 12/30/2022 0820   HCT 40.7 03/15/2023 1211   HCT 44.5 12/30/2022 0820   PLT 302  03/15/2023 1211   PLT 345 12/30/2022 0820   MCV 95.8 03/15/2023 1211   MCV 99 (H) 12/30/2022 0820   MCH 30.1 03/15/2023 1211   MCHC 31.4 03/15/2023 1211   RDW 14.1 03/15/2023 1211   RDW 12.5 12/30/2022 0820   LYMPHSABS 2.4 03/15/2023 1211   LYMPHSABS 1.6 12/30/2022 0820   MONOABS 0.8 03/15/2023 1211   EOSABS 0.3 03/15/2023 1211   EOSABS 0.2 12/30/2022 0820   BASOSABS 0.1 03/15/2023 1211   BASOSABS 0.1 12/30/2022 0820    CMP      Component Value Date/Time   NA 135 03/15/2023 1211   NA 141 12/30/2022 0820   K 3.6 03/15/2023 1211   CL 100 03/15/2023 1211   CO2 27 03/15/2023 1211   GLUCOSE 86 03/15/2023 1211   BUN 24 (H) 03/15/2023 1211   BUN 14 12/30/2022 0820   CREATININE 0.73 03/15/2023 1211   CALCIUM  9.6 03/15/2023 1211   PROT 7.3 03/15/2023 1211   PROT 7.3 12/30/2022 0820   ALBUMIN 4.3 03/15/2023 1211   ALBUMIN 4.9 (H) 12/30/2022 0820   AST 33 03/15/2023 1211   ALT 23 03/15/2023 1211   ALKPHOS 55 03/15/2023 1211   BILITOT 0.3 03/15/2023 1211   BILITOT 0.6 12/30/2022 0820   GFRNONAA >  60 03/15/2023 1211     No results found for: CEA1, CEA / No results found for: CEA1, CEA No results found for: PSA1 No results found for: CAN199 No results found for: CAN125  No results found for: TOTALPROTELP, ALBUMINELP, A1GS, A2GS, BETS, BETA2SER, GAMS, MSPIKE, SPEI No results found for: TIBC, FERRITIN, IRONPCTSAT No results found for: LDH   STUDIES:   No results found.

## 2023-03-22 ENCOUNTER — Inpatient Hospital Stay (HOSPITAL_BASED_OUTPATIENT_CLINIC_OR_DEPARTMENT_OTHER): Payer: Medicare Other | Admitting: Hematology

## 2023-03-22 VITALS — BP 122/57 | HR 68 | Temp 97.6°F | Resp 17 | Wt 132.7 lb

## 2023-03-22 DIAGNOSIS — F419 Anxiety disorder, unspecified: Secondary | ICD-10-CM | POA: Diagnosis not present

## 2023-03-22 DIAGNOSIS — C3491 Malignant neoplasm of unspecified part of right bronchus or lung: Secondary | ICD-10-CM | POA: Diagnosis not present

## 2023-03-22 DIAGNOSIS — M8580 Other specified disorders of bone density and structure, unspecified site: Secondary | ICD-10-CM | POA: Diagnosis not present

## 2023-03-22 DIAGNOSIS — Z882 Allergy status to sulfonamides status: Secondary | ICD-10-CM | POA: Diagnosis not present

## 2023-03-22 DIAGNOSIS — Z818 Family history of other mental and behavioral disorders: Secondary | ICD-10-CM | POA: Diagnosis not present

## 2023-03-22 DIAGNOSIS — C3431 Malignant neoplasm of lower lobe, right bronchus or lung: Secondary | ICD-10-CM | POA: Diagnosis present

## 2023-03-22 DIAGNOSIS — Z8249 Family history of ischemic heart disease and other diseases of the circulatory system: Secondary | ICD-10-CM | POA: Diagnosis not present

## 2023-03-22 DIAGNOSIS — Z87891 Personal history of nicotine dependence: Secondary | ICD-10-CM | POA: Diagnosis not present

## 2023-03-22 DIAGNOSIS — Z8042 Family history of malignant neoplasm of prostate: Secondary | ICD-10-CM | POA: Diagnosis not present

## 2023-03-22 DIAGNOSIS — Z881 Allergy status to other antibiotic agents status: Secondary | ICD-10-CM | POA: Diagnosis not present

## 2023-03-22 DIAGNOSIS — Z880 Allergy status to penicillin: Secondary | ICD-10-CM | POA: Diagnosis not present

## 2023-03-22 DIAGNOSIS — Z79899 Other long term (current) drug therapy: Secondary | ICD-10-CM | POA: Diagnosis not present

## 2023-03-22 DIAGNOSIS — Z833 Family history of diabetes mellitus: Secondary | ICD-10-CM | POA: Diagnosis not present

## 2023-03-22 NOTE — Patient Instructions (Signed)
 [name] at [facility] Discharge Instructions   You were seen and examined today by Dr. Rogers.  He reviewed the results of your lab work which are normal/stable.   He reviewed the images of your CT scan. It is showing no change from your previous scan. We do not have the official report from the radiologist yet. If anything changes we will call you.   We will see you back in one year. We will repeat lab work and a scan prior to this visit.   Return as scheduled.    Thank you for choosing [facility] at [facility] to provide your oncology and hematology care.  To afford each patient quality time with our provider, please arrive at least 15 minutes before your scheduled appointment time.   If you have a lab appointment with the Cancer Center please come in thru the Main Entrance and check in at the main information desk.  You need to re-schedule your appointment should you arrive 10 or more minutes late.  We strive to give you quality time with our providers, and arriving late affects you and other patients whose appointments are after yours.  Also, if you no show three or more times for appointments you may be dismissed from the clinic at the providers discretion.     Again, thank you for choosing [facility].  Our hope is that these requests will decrease the amount of time that you wait before being seen by our physicians.       _____________________________________________________________  Should you have questions after your visit to [facility], please contact our office at [phone] and follow the prompts.  Our office hours are 8:00 a.m. and 4:30 p.m. Monday - Friday.  Please note that voicemails left after 4:00 p.m. may not be returned until the following business day.  We are closed weekends and major holidays.  You do have access to a nurse 24-7, just call the main number to the clinic [phone] and  do not press any options, hold on the line and a nurse will answer the phone.    For prescription refill requests, have your pharmacy contact our office and allow 72 hours.    Due to Covid, you will need to wear a mask upon entering the hospital. If you do not have a mask, a mask will be given to you at the Main Entrance upon arrival. For doctor visits, patients may have 1 support person age 53 or older with them. For treatment visits, patients can not have anyone with them due to social distancing guidelines and our immunocompromised population.

## 2023-04-06 ENCOUNTER — Ambulatory Visit: Payer: Medicare Other | Admitting: Family Medicine

## 2023-05-10 ENCOUNTER — Telehealth: Payer: Self-pay | Admitting: Neurology

## 2023-05-10 NOTE — Telephone Encounter (Signed)
 ..  Pt understands that although there may be some limitations with this type of visit, we will take all precautions to reduce any security or privacy concerns.  Pt understands that this will be treated like an in office visit and we will file with pt's insurance, and there may be a patient responsible charge related to this service. ? ?
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
## 2023-05-11 ENCOUNTER — Telehealth (INDEPENDENT_AMBULATORY_CARE_PROVIDER_SITE_OTHER): Payer: Medicare Other | Admitting: Neurology

## 2023-05-11 DIAGNOSIS — G20C Parkinsonism, unspecified: Secondary | ICD-10-CM | POA: Diagnosis not present

## 2023-05-11 NOTE — Patient Instructions (Signed)
 Great to see you today.  You can continue to remain off Sinemet.  Please exercise and be active.  Continue follow-up with primary care.  I will see you back in the office in 1 year.  Thanks!!

## 2023-06-01 ENCOUNTER — Ambulatory Visit (INDEPENDENT_AMBULATORY_CARE_PROVIDER_SITE_OTHER): Admitting: Family Medicine

## 2023-06-01 ENCOUNTER — Encounter: Payer: Self-pay | Admitting: Family Medicine

## 2023-06-01 VITALS — BP 111/62 | HR 65 | Temp 97.9°F | Ht 66.0 in | Wt 132.8 lb

## 2023-06-01 DIAGNOSIS — J0100 Acute maxillary sinusitis, unspecified: Secondary | ICD-10-CM | POA: Diagnosis not present

## 2023-06-01 MED ORDER — DOXYCYCLINE HYCLATE 100 MG PO TABS: 100.0000 mg | ORAL_TABLET | Freq: Two times a day (BID) | ORAL | 0 refills | Status: AC

## 2023-06-01 NOTE — Progress Notes (Signed)
 Acute Office Visit  Subjective:     Patient ID: Deborah [name]. Conly, female    DOB: 1945/03/11, 78 y.o.   MRN: [id number]  Chief Complaint  Patient presents with   Nasal Congestion    Off and on 3 months   Otalgia    Left, x 3 day    Sinusitis This is a new problem. Episode onset: 3-4 months. The problem has been waxing and waning since onset. There has been no fever. Associated symptoms include congestion, ear pain (left x 3 days), headaches, sinus pressure (left side) and a sore throat (sometimes in the morning). Pertinent negatives include no chills or coughing. Past treatments include oral decongestants and acetaminophen (zyrtec, flonase, nasacort). The treatment provided no relief.     Review of Systems  Constitutional:  Negative for chills.  HENT:  Positive for congestion, ear pain (left x 3 days), sinus pressure (left side) and sore throat (sometimes in the morning).   Respiratory:  Negative for cough.   Neurological:  Positive for headaches.        Objective:    BP 111/62   Pulse 65   Temp 97.9 F (36.6 C)   Ht 5\' 6"  (1.676 m)   Wt 132 lb 12.8 oz (60.2 kg)   SpO2 98%   BMI 21.43 kg/m    Physical Exam Vitals and nursing note reviewed.  Constitutional:      General: She is not in acute distress.    Appearance: She is not ill-appearing, toxic-appearing or diaphoretic.  HENT:     Head: Normocephalic and atraumatic.     Right Ear: Tympanic membrane, ear canal and external ear normal.     Left Ear: Ear canal and external ear normal. A middle ear effusion is present. Tympanic membrane is not perforated, erythematous, retracted or bulging.     Nose: Congestion present.     Right Sinus: Maxillary sinus tenderness present. No frontal sinus tenderness.     Left Sinus: Maxillary sinus tenderness present. No frontal sinus tenderness.     Mouth/Throat:     Mouth: Mucous membranes are moist.     Pharynx: Oropharynx is clear. No oropharyngeal exudate or posterior  oropharyngeal erythema.  Eyes:     General:        Right eye: No discharge.        Left eye: No discharge.     Conjunctiva/sclera: Conjunctivae normal.  Cardiovascular:     Rate and Rhythm: Normal rate and regular rhythm.     Heart sounds: Normal heart sounds. No murmur heard. Pulmonary:     Effort: Pulmonary effort is normal. No respiratory distress.     Breath sounds: Normal breath sounds. No wheezing, rhonchi or rales.  Musculoskeletal:     Cervical back: Neck supple. No rigidity.     Right lower leg: No edema.     Left lower leg: No edema.  Skin:    General: Skin is warm and dry.  Neurological:     General: No focal deficit present.     Mental Status: She is alert and oriented to person, place, and time.  Psychiatric:        Mood and Affect: Mood normal.        Behavior: Behavior normal.     No results found for any visits on 06/01/23.      Assessment & Plan:   Deborah "Pam" was seen today for nasal congestion and otalgia.  Diagnoses and all orders for this visit:  Acute non-recurrent maxillary sinusitis Doxycyline as below. Continue nasal spray, zyrtec daily. Discussed nasal saline, decongestants prn. Return to office for new or worsening symptoms, or if symptoms persist.  -     doxycycline (VIBRA-TABS) 100 MG tablet; Take 1 tablet (100 mg total) by mouth 2 (two) times daily for 7 days.   Return if symptoms worsen or fail to improve.  The patient indicates understanding of these issues and agrees with the plan.  Gabriel Earing, FNP

## 2023-06-21 ENCOUNTER — Other Ambulatory Visit: Payer: Self-pay | Admitting: Family Medicine

## 2023-06-21 DIAGNOSIS — Z1231 Encounter for screening mammogram for malignant neoplasm of breast: Secondary | ICD-10-CM

## 2023-07-01 ENCOUNTER — Telehealth: Payer: Self-pay | Admitting: Family Medicine

## 2023-07-01 NOTE — Telephone Encounter (Signed)
 Looks like pt had abnormal mammogram in 2023 at [facility]. Can she be seen on our mammogram bus or can she be sent somewhere closer? Maybe Breast [facility] in [place]?    Copied from CRM [id number]. Topic: Appointments - Scheduling Inquiry for Clinic >> Jul 01, 2023 12:42 PM [place] wrote: Reason for CRM: Patient needs correct location information for the mobile mammography, does not want to go to [facility], please call [phone]

## 2023-07-04 NOTE — Telephone Encounter (Signed)
 Reviewed chart and patient is okay to have done with the mobile if she not haviing current issues -  pt aware

## 2023-07-06 ENCOUNTER — Ambulatory Visit
Admission: RE | Admit: 2023-07-06 | Discharge: 2023-07-06 | Disposition: A | Discharge status: Home / Self Care | Source: Ambulatory Visit | Attending: Family Medicine | Admitting: Family Medicine

## 2023-07-06 DIAGNOSIS — Z1231 Encounter for screening mammogram for malignant neoplasm of breast: Secondary | ICD-10-CM

## 2023-09-01 ENCOUNTER — Ambulatory Visit (INDEPENDENT_AMBULATORY_CARE_PROVIDER_SITE_OTHER): Admitting: Family Medicine

## 2023-09-01 ENCOUNTER — Encounter: Payer: Self-pay | Admitting: Family Medicine

## 2023-09-01 VITALS — BP 106/56 | HR 70 | Temp 98.4°F | Ht 66.0 in | Wt 129.2 lb

## 2023-09-01 DIAGNOSIS — K219 Gastro-esophageal reflux disease without esophagitis: Secondary | ICD-10-CM

## 2023-09-01 DIAGNOSIS — F411 Generalized anxiety disorder: Secondary | ICD-10-CM | POA: Diagnosis not present

## 2023-09-01 MED ORDER — OMEPRAZOLE 20 MG PO CPDR: 20.0000 mg | DELAYED_RELEASE_CAPSULE | Freq: Every day | ORAL | 3 refills | Status: DC

## 2023-09-01 NOTE — Progress Notes (Signed)
 Acute Office Visit  Subjective:     Patient ID: Deborah [name]. Deborah [name], female    DOB: November 05, 1945, 78 y.o.   MRN: [id number]  Chief Complaint  Patient presents with   Abdominal Pain    Abdominal Pain The current episode started more than 1 month ago. The quality of the pain is dull. The abdominal pain radiates to the epigastric region, RLQ and LLQ. Associated symptoms include belching and flatus. Pertinent negatives include no constipation, diarrhea, dysuria, fever, nausea or vomiting. Associated symptoms comments: heartburn. She has tried antacids for the symptoms. The treatment provided moderate relief. Her past medical history is significant for GERD. hiatial hernia  Used to be on prilosec with good control of GERD.   Chest CT 07/06/23- no hiatal hernia noted.   She got a message from her insurance regarding her lexapro  dosage, recommended that it be decreased. Anxiety is currently well controlled with lexapro  20 mg daily. She doesn't desire a changed if not necessary.      09/01/2023    9:12 AM 06/01/2023    8:14 AM 12/30/2022    7:54 AM  Depression screen PHQ 2/9  Decreased Interest 0 0 0  Down, Depressed, Hopeless 0 0 0  PHQ - 2 Score 0 0 0  Altered sleeping 0  0  Tired, decreased energy 0  0  Change in appetite 0  0  Feeling bad or failure about yourself  0  0  Trouble concentrating 0  0  Moving slowly or fidgety/restless 0  0  Suicidal thoughts 0  0  PHQ-9 Score 0  0  Difficult doing work/chores Not difficult at all  Not difficult at all      09/01/2023    9:12 AM 12/30/2022    7:54 AM 11/30/2021    8:11 AM 10/30/2021    8:26 AM  GAD 7 : Generalized Anxiety Score  Nervous, Anxious, on Edge 0 0 0 0  Control/stop worrying 0 0 0 0  Worry too much - different things 0 0 0 0  Trouble relaxing 0 0 0 0  Restless 0 0 0 0  Easily annoyed or irritable 0 0 0 0  Afraid - awful might happen 0 0 0 0  Total GAD 7 Score 0 0 0 0  Anxiety Difficulty Not difficult at all Not  difficult at all Not difficult at all Not difficult at all      Review of Systems  Constitutional:  Negative for fever.  Gastrointestinal:  Positive for abdominal pain and flatus. Negative for constipation, diarrhea, nausea and vomiting.  Genitourinary:  Negative for dysuria.        Objective:    BP (!) 106/56   Pulse 70   Temp 98.4 F (36.9 C) (Temporal)   Ht 5' 6 (1.676 m)   Wt 129 lb 3.2 oz (58.6 kg)   SpO2 98%   BMI 20.85 kg/m  Wt Readings from Last 3 Encounters:  09/01/23 129 lb 3.2 oz (58.6 kg)  06/01/23 132 lb 12.8 oz (60.2 kg)  03/22/23 132 lb 11.5 oz (60.2 kg)      Physical Exam Vitals and nursing note reviewed.  Constitutional:      General: She is not in acute distress.    Appearance: She is normal weight. She is not ill-appearing, toxic-appearing or diaphoretic.   Eyes:     General: No scleral icterus.   Cardiovascular:     Rate and Rhythm: Normal rate and regular rhythm.  Heart sounds: Normal heart sounds. No murmur heard. Pulmonary:     Effort: Pulmonary effort is normal. No respiratory distress.     Breath sounds: Normal breath sounds. No wheezing, rhonchi or rales.  Abdominal:     General: Abdomen is flat. Bowel sounds are normal. There is no distension or abdominal bruit.     Palpations: Abdomen is soft. There is no mass.     Tenderness: There is no abdominal tenderness. There is no guarding or rebound.     Hernia: No hernia is present.   Skin:    General: Skin is warm and dry.     Coloration: Skin is not jaundiced.   Neurological:     General: No focal deficit present.     Mental Status: She is alert and oriented to person, place, and time.   Psychiatric:        Mood and Affect: Mood normal.        Behavior: Behavior normal.     No results found for any visits on 09/01/23.      Assessment & Plan:   Deborah [name] was seen today for abdominal pain.  Diagnoses and all orders for this visit:  Gastroesophageal reflux disease  without esophagitis Restart omeprazole . She will let me know if symptoms worsen or do not improve in 2-3 weeks. -     omeprazole  (PRILOSEC) 20 MG capsule; Take 1 capsule (20 mg total) by mouth daily.  Generalized anxiety disorder Continue lexparo 20 mg.   Keep scheduled CPE appt.   The patient indicates understanding of these issues and agrees with the plan.  Deborah [name] Search, FNP

## 2023-09-25 ENCOUNTER — Other Ambulatory Visit: Payer: Self-pay | Admitting: Family Medicine

## 2023-09-25 DIAGNOSIS — I700 Atherosclerosis of aorta: Secondary | ICD-10-CM

## 2023-09-30 ENCOUNTER — Encounter: Payer: Self-pay | Admitting: Neurology

## 2023-10-05 ENCOUNTER — Ambulatory Visit (INDEPENDENT_AMBULATORY_CARE_PROVIDER_SITE_OTHER): Payer: Medicare Other

## 2023-10-05 VITALS — BP 106/54 | HR 70 | Ht 66.0 in | Wt 129.0 lb

## 2023-10-05 DIAGNOSIS — Z0000 Encounter for general adult medical examination without abnormal findings: Secondary | ICD-10-CM

## 2023-10-05 NOTE — Patient Instructions (Signed)
 Deborah [name] , Thank you for taking time out of your busy schedule to complete your Annual Wellness Visit with me. I enjoyed our conversation and look forward to speaking with you again next year. I, as well as your care team,  appreciate your ongoing commitment to your health goals. Please review the following plan we discussed and let me know if I can assist you in the future. Your Game plan/ To Do List    Follow up Visits: Next Medicare AWV with our clinical staff: 10/05/24 at 8:00a.m.   Next Office Visit with your provider: 10/06/23 at 8:30a.m.  Clinician Recommendations:  Aim for 30 minutes of exercise or brisk walking, 6-8 glasses of water, and 5 servings of fruits and vegetables each day.       This is a list of the screening recommended for you and due dates:  Health Maintenance  Topic Date Due   Zoster (Shingles) Vaccine (1 of 2) 01/03/1966   COVID-19 Vaccine (6 - Moderna risk 2024-25 season) 05/07/2023   Medicare Annual Wellness Visit  10/04/2023   Flu Shot  10/07/2023   DEXA scan (bone density measurement)  12/29/2025   DTaP/Tdap/Td vaccine (5 - Td or Tdap) 11/06/2032   Pneumococcal Vaccine for age over 40  Completed   Hepatitis C Screening  Completed   Hepatitis B Vaccine  Aged Out   HPV Vaccine  Aged Out   Meningitis B Vaccine  Aged Out   Colon Cancer Screening  Discontinued    Advanced directives: (Declined) Advance directive discussed with you today. Even though you declined this today, please call our office should you change your mind, and we can give you the proper paperwork for you to fill out. Advance Care Planning is important because it:  [x]  Makes sure you receive the medical care that is consistent with your values, goals, and preferences  [x]  It provides guidance to your family and loved ones and reduces their decisional burden about whether or not they are making the right decisions based on your wishes.  Follow the link provided in your after visit summary or  read over the paperwork we have mailed to you to help you started getting your Advance Directives in place. If you need assistance in completing these, please reach out to us  so that we can help you!  See attachments for Preventive Care and Fall Prevention Tips.

## 2023-10-05 NOTE — Progress Notes (Signed)
 Subjective:   Deborah [name]. [name] is a 78 y.o. who presents for a Medicare Wellness preventive visit.  As a reminder, Annual Wellness Visits don't include a physical exam, and some assessments may be limited, especially if this visit is performed virtually. We may recommend an in-person follow-up visit with your provider if needed.  Visit Complete: Virtual I connected with  Deborah [name] on 10/05/23 by a audio enabled telemedicine application and verified that I am speaking with the correct person using two identifiers.  Patient Location: Home  Provider Location: Home Office  I discussed the limitations of evaluation and management by telemedicine. The patient expressed understanding and agreed to proceed.  Vital Signs: Because this visit was a virtual/telehealth visit, some criteria may be missing or patient reported. Any vitals not documented were not able to be obtained and vitals that have been documented are patient reported.  VideoDeclined- This patient declined Librarian, academic. Therefore the visit was completed with audio only.  Persons Participating in Visit: Patient.  AWV Questionnaire: Yes: Patient Medicare AWV questionnaire was completed by the patient on 09/30/23; I have confirmed that all information answered by patient is correct and no changes since this date.  Cardiac Risk Factors include: advanced age (>13men, >8 women);smoking/ tobacco exposure     Objective:    Today's Vitals   10/05/23 0856  BP: (!) 106/54  Pulse: 70  Weight: 129 lb (58.5 kg)  Height: 5' 6 (1.676 m)   Body mass index is 20.82 kg/m.     10/05/2023    8:51 AM 03/22/2023   11:33 AM 10/04/2022    8:55 AM 03/23/2022   11:11 AM 09/18/2021    9:10 AM 02/23/2021    2:05 PM 11/11/2020    1:22 PM  Advanced Directives  Does Patient Have a Medical Advance Directive? No No No No Yes No No  Type of Agricultural consultant;Living will    Copy  of Healthcare Power of Attorney in Chart?     No - copy requested    Would patient like information on creating a medical advance directive?  No - Patient declined Yes (MAU/Ambulatory/Procedural Areas - Information given) No - Patient declined  No - Patient declined No - Patient declined    Current Medications (verified) Outpatient Encounter Medications as of 10/05/2023  Medication Sig   alendronate  (FOSAMAX ) 70 MG tablet Take 1 tablet (70 mg total) by mouth every 7 (seven) days. Take with a full glass of water on an empty stomach.   atenolol  (TENORMIN ) 25 MG tablet Take 1 tablet (25 mg total) by mouth daily.   Calcium  Carbonate-Vit D-Min (CALCIUM  1200 PO) Take 1,200 mg by mouth daily.   cetirizine (ZYRTEC) 10 MG tablet Take 10 mg by mouth daily.   cholecalciferol (VITAMIN D3) 25 MCG (1000 UNIT) tablet Take 1,000 Units by mouth daily.   escitalopram  (LEXAPRO ) 20 MG tablet Take 1 tablet (20 mg total) by mouth daily.   omeprazole  (PRILOSEC) 20 MG capsule Take 1 capsule (20 mg total) by mouth daily.   rosuvastatin  (CRESTOR ) 5 MG tablet TAKE 1 TABLET (5 MG TOTAL) BY MOUTH DAILY.   No facility-administered encounter medications on file as of 10/05/2023.    Allergies (verified) Sulfa antibiotics, Amoxicillin-pot clavulanate, Cefdinir, Ciprofloxacin, Clarithromycin, Other, and Penicillins   History: Past Medical History:  Diagnosis Date   Allergy ?   Anxiety    Arthritis    Asthma  Cancer (HCC)    GERD (gastroesophageal reflux disease)    Hiatal hernia    Hyperlipidemia    Lung cancer (HCC)    Osteopenia    Parkinson's syndrome (HCC)    Raynaud's disease    Past Surgical History:  Procedure Laterality Date   BACK SURGERY  1995   Decompress disc lumbar   BREAST BIOPSY  1995   CATARACT EXTRACTION, BILATERAL Bilateral 2017   COLONOSCOPY  08/20/2015   repeat in 5 years   ESOPHAGOGASTRODUODENOSCOPY  2021   EYE SURGERY  2019   cataracs removed   LUNG REMOVAL, PARTIAL Right     REFRACTIVE SURGERY Left 08/24/2016   Family History  Problem Relation Age of Onset   Dementia Mother    Heart disease Father    Diabetes Father    Prostate cancer Father    Cancer Father    Diabetes Brother    Acute myelogenous leukemia Brother    Cancer Brother    Early death Brother    Parkinson's disease Neg Hx    Breast cancer Neg Hx    Social History   Socioeconomic History   Marital status: Married    Spouse name: Vicenta   Number of children: 1   Years of education: Not on file   Highest education level: 12th grade  Occupational History   Occupation: retired  Tobacco Use   Smoking status: Former    Current packs/day: 0.00    Average packs/day: 0.3 packs/day for 3.0 years (0.8 ttl pk-yrs)    Types: Cigarettes    Start date: 07/23/1957    Quit date: 07/23/1960    Years since quitting: 63.2   Smokeless tobacco: Never  Vaping Use   Vaping status: Never Used  Substance and Sexual Activity   Alcohol use: Not Currently    Alcohol/week: 0.0 - 1.0 standard drinks of alcohol    Comment: maybe 1 or 2 beers a month   Drug use: Never   Sexual activity: Yes    Partners: Male    Birth control/protection: None  Other Topics Concern   Not on file  Social History Narrative   Moved here from [place]  with husband Vicenta beginning of 2022. Their child lives nearby.      Caffeine: none   Right handed   Social Drivers of Corporate investment banker Strain: Low Risk  (05/31/2023)   Overall Financial Resource Strain (CARDIA)    Difficulty of Paying Living Expenses: Not hard at all  Food Insecurity: No Food Insecurity (10/05/2023)   Hunger Vital Sign    Worried About Running Out of Food in the Last Year: Never true    Ran Out of Food in the Last Year: Never true  Transportation Needs: No Transportation Needs (08/31/2023)   PRAPARE - Administrator, Civil Service (Medical): No    Lack of Transportation (Non-Medical): No  Physical Activity: Sufficiently Active  (08/31/2023)   Exercise Vital Sign    Days of Exercise per Week: 5 days    Minutes of Exercise per Session: 60 min  Stress: No Stress Concern Present (08/31/2023)   [employer] of Occupational Health - Occupational Stress Questionnaire    Feeling of Stress: Only a little  Social Connections: Moderately Isolated (08/31/2023)   Social Connection and Isolation Panel    Frequency of Communication with Friends and Family: Three times a week    Frequency of Social Gatherings with Friends and Family: Twice a week    Attends Religious  Services: Patient declined    Database administrator or Organizations: No    Attends Engineer, structural: Not on file    Marital Status: Married    Tobacco Counseling Counseling given: Yes    Clinical Intake:  Pre-visit preparation completed: Yes  Pain : No/denies pain     Nutritional Risks: None Diabetes: No  No results found for: HGBA1C   How often do you need to have someone help you when you read instructions, pamphlets, or other written materials from your doctor or pharmacy?: 1 - Never  Interpreter Needed?: No  Information entered by :: alia t/cma   Activities of Daily Living     09/30/2023   12:50 PM  In your present state of health, do you have any difficulty performing the following activities:  Hearing? 0  Vision? 0  Difficulty concentrating or making decisions? 0  Walking or climbing stairs? 0  Dressing or bathing? 0  Doing errands, shopping? 0  Preparing Food and eating ? N  Using the Toilet? N  In the past six months, have you accidently leaked urine? N  Do you have problems with loss of bowel control? N  Managing your Medications? N  Managing your Finances? N  Housekeeping or managing your Housekeeping? N    Patient Care Team: Joesph Annabella HERO, FNP as PCP - General (Family Medicine) Celestia Joesph SQUIBB, RN as Oncology Nurse Navigator (Oncology) Rogers Hai, MD as Medical Oncologist  (Oncology)  I have updated your Care Teams any recent Medical Services you may have received from other providers in the past year.     Assessment:   This is a routine wellness examination for Deborah [name].  Hearing/Vision screen Hearing Screening - Comments:: Pt denies hearing dif Vision Screening - Comments:: Pt denies vision dif/pt goes [facility] in [place]/last ov a month ago   Goals Addressed             This Visit's Progress    Exercise 3x per week (30 min per time)   On track    Continue using your treadmill and walking Currently using 5 days per week 1 hour am, 30 min pm       Depression Screen     09/01/2023    9:12 AM 06/01/2023    8:14 AM 12/30/2022    7:54 AM 10/04/2022    8:54 AM 11/30/2021    8:10 AM 10/30/2021    8:26 AM 09/18/2021    9:05 AM  PHQ 2/9 Scores  PHQ - 2 Score 0 0 0 0 0 0 0  PHQ- 9 Score 0  0  0 0     Fall Risk     09/30/2023   12:50 PM 09/01/2023    9:12 AM 06/01/2023    8:14 AM 12/30/2022    7:53 AM 10/04/2022    8:52 AM  Fall Risk   Falls in the past year? 0 0 0 0 0  Number falls in past yr:     0  Injury with Fall? 0    0  Risk for fall due to :     No Fall Risks  Follow up     Falls prevention discussed    MEDICARE RISK AT HOME:  Medicare Risk at Home Any stairs in or around the home?: (Patient-Rptd) Yes If so, are there any without handrails?: (Patient-Rptd) No Home free of loose throw rugs in walkways, pet beds, electrical cords, etc?: (Patient-Rptd) Yes Adequate lighting in  your home to reduce risk of falls?: (Patient-Rptd) Yes Life alert?: (Patient-Rptd) No Use of a cane, walker or w/c?: (Patient-Rptd) No Grab bars in the bathroom?: (Patient-Rptd) No Shower chair or bench in shower?: (Patient-Rptd) Yes Elevated toilet seat or a handicapped toilet?: (Patient-Rptd) No  TIMED UP AND GO:  Was the test performed?  no  Cognitive Function: 6CIT completed        10/05/2023    8:53 AM 10/04/2022    8:56 AM 09/18/2021     9:08 AM 09/17/2020    9:26 AM  6CIT Screen  What Year? 0 points 0 points 0 points 0 points  What month? 0 points 0 points 0 points 0 points  What time? 0 points 0 points 0 points 0 points  Count back from 20 0 points 0 points 0 points 0 points  Months in reverse 0 points 0 points 0 points 0 points  Repeat phrase 0 points 0 points 0 points 0 points  Total Score 0 points 0 points 0 points 0 points    Immunizations Immunization History  Administered Date(s) Administered   DT (Pediatric) 01/27/1999   Fluad Quad(high Dose 65+) 11/03/2020   Influenza Split 10/23/2019   Influenza, High Dose Seasonal PF 11/08/2013, 11/30/2014, 10/29/2016, 10/18/2017, 10/20/2018, 10/23/2019, 11/04/2020, 11/07/2022   Influenza,trivalent, recombinat, inj, PF 11/07/2022   Influenza-Unspecified 12/28/2007, 10/29/2016, 10/20/2018, 10/29/2021   Moderna Covid-19 Fall Seasonal Vaccine 54yrs & older 11/07/2022   Moderna Covid-19 Vaccine Bivalent Booster 56yrs & up 10/29/2021   Moderna Sars-Covid-2 Vaccination 05/12/2019, 06/09/2019, 12/29/2019, 11/07/2022   Pneumococcal Conjugate-13 04/20/2013   Pneumococcal Polysaccharide-23 12/13/2013   RSV,unspecified 11/23/2022   Td 01/27/1999   Tdap 10/03/2012, 11/07/2022   Zoster, Live 12/28/2011    Screening Tests Health Maintenance  Topic Date Due   Zoster Vaccines- Shingrix (1 of 2) 01/03/1966   COVID-19 Vaccine (6 - Moderna risk 2024-25 season) 05/07/2023   INFLUENZA VACCINE  10/07/2023   Medicare Annual Wellness (AWV)  10/04/2024   DEXA SCAN  12/29/2025   DTaP/Tdap/Td (5 - Td or Tdap) 11/06/2032   Pneumococcal Vaccine: 50+ Years  Completed   Hepatitis C Screening  Completed   Hepatitis B Vaccines  Aged Out   HPV VACCINES  Aged Out   Meningococcal B Vaccine  Aged Out   Colonoscopy  Discontinued    Health Maintenance  Health Maintenance Due  Topic Date Due   Zoster Vaccines- Shingrix (1 of 2) 01/03/1966   COVID-19 Vaccine (6 - Moderna risk 2024-25  season) 05/07/2023   Health Maintenance Items Addressed: See Nurse Notes at the end of this note  Additional Screening:  Vision Screening: Recommended annual ophthalmology exams for early detection of glaucoma and other disorders of the eye. Would you like a referral to an eye doctor? No    Dental Screening: Recommended annual dental exams for proper oral hygiene  Community Resource Referral / Chronic Care Management: CRR required this visit?  No   CCM required this visit?  No   Plan:    I have personally reviewed and noted the following in the patient's chart:   Medical and social history Use of alcohol, tobacco or illicit drugs  Current medications and supplements including opioid prescriptions. Patient is not currently taking opioid prescriptions. Functional ability and status Nutritional status Physical activity Advanced directives List of other physicians Hospitalizations, surgeries, and ER visits in previous 12 months Vitals Screenings to include cognitive, depression, and falls Referrals and appointments  In addition, I have reviewed and discussed with  patient certain preventive protocols, quality metrics, and best practice recommendations. A written personalized care plan for preventive services as well as general preventive health recommendations were provided to patient.   Ozie Ned, CMA   10/05/2023   After Visit Summary: (MyChart) Due to this being a telephonic visit, the after visit summary with patients personalized plan was offered to patient via MyChart   Notes: Nothing significant to report at this time.

## 2023-10-06 ENCOUNTER — Ambulatory Visit (INDEPENDENT_AMBULATORY_CARE_PROVIDER_SITE_OTHER): Admitting: Family Medicine

## 2023-10-06 ENCOUNTER — Encounter: Payer: Self-pay | Admitting: Family Medicine

## 2023-10-06 VITALS — BP 110/65 | HR 66 | Temp 97.9°F | Ht 66.0 in | Wt 130.6 lb

## 2023-10-06 DIAGNOSIS — M26609 Unspecified temporomandibular joint disorder, unspecified side: Secondary | ICD-10-CM | POA: Insufficient documentation

## 2023-10-06 DIAGNOSIS — J0110 Acute frontal sinusitis, unspecified: Secondary | ICD-10-CM | POA: Diagnosis not present

## 2023-10-06 DIAGNOSIS — J4521 Mild intermittent asthma with (acute) exacerbation: Secondary | ICD-10-CM | POA: Insufficient documentation

## 2023-10-06 MED ORDER — DOXYCYCLINE HYCLATE 100 MG PO TABS: 100.0000 mg | ORAL_TABLET | Freq: Two times a day (BID) | ORAL | 0 refills | Status: AC

## 2023-10-06 MED ORDER — PREDNISONE 20 MG PO TABS: 40.0000 mg | ORAL_TABLET | Freq: Every day | ORAL | 0 refills | Status: AC

## 2023-10-06 MED ORDER — ALBUTEROL SULFATE HFA 108 (90 BASE) MCG/ACT IN AERS: 2.0000 | INHALATION_SPRAY | Freq: Four times a day (QID) | RESPIRATORY_TRACT | 0 refills | Status: DC | PRN

## 2023-10-06 NOTE — Progress Notes (Signed)
 Acute Office Visit  Subjective:     Patient ID: Deborah [name]. Deborah [name], female    DOB: 30-Jun-1945, 78 y.o.   MRN: [id number]  Chief Complaint  Patient presents with   Facial Pain    Sinusitis This is a new problem. Episode onset: 10 days. The problem has been gradually worsening since onset. There has been no fever. Associated symptoms include congestion, coughing (in the morning), ear pain (left ear), headaches (frontal), sinus pressure (frontal) and sneezing. Pertinent negatives include no shortness of breath or sore throat. Past treatments include acetaminophen  and oral decongestants (advil). The treatment provided mild relief.   She reports a history of asthma that was dx many years ago after the birth of her daughter. She has not had any asthma symptoms in years but has had some intermittent wheezing this week.   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 It is on the left side. Clicking, popping. Worse when eating. She has been eating softer foods. Clenches teeth at night.   Review of Systems  HENT:  Positive for congestion, ear pain (left ear), sinus pressure (frontal) and sneezing. Negative for sore throat.   Respiratory:  Positive for cough (in the morning). Negative for shortness of breath.   Neurological:  Positive for headaches (frontal).        Objective:    BP 110/65   Pulse 66   Temp 97.9 F (36.6 C) (Temporal)   Ht 5' 6 (1.676 m)   Wt 130 lb 9.6 oz (59.2 kg)   SpO2 100%   BMI 21.08 kg/m    Physical Exam Vitals and nursing note reviewed.  Constitutional:      General: She is not in acute distress.    Appearance: She is not ill-appearing, toxic-appearing or diaphoretic.  HENT:     Head: Normocephalic and atraumatic.     Jaw: No tenderness, swelling or malocclusion.     Comments: Clicking of left TMJ    Right Ear: Tympanic membrane, ear canal and external ear normal.     Left Ear: External ear normal. A middle ear effusion is present. Tympanic membrane is not  scarred, perforated, erythematous or retracted.     Nose: Congestion present.     Right Sinus: Maxillary sinus tenderness and frontal sinus tenderness present.     Left Sinus: Maxillary sinus tenderness and frontal sinus tenderness present.     Mouth/Throat:     Mouth: Mucous membranes are moist.     Pharynx: Oropharynx is clear. No oropharyngeal exudate or posterior oropharyngeal erythema.  Eyes:     General:        Right eye: No discharge.        Left eye: No discharge.     Conjunctiva/sclera: Conjunctivae normal.  Cardiovascular:     Rate and Rhythm: Normal rate and regular rhythm.     Heart sounds: Normal heart sounds. No murmur heard. Pulmonary:     Effort: Pulmonary effort is normal. No respiratory distress.     Breath sounds: No stridor. Wheezing (initially, mild) present. No rhonchi or rales.  Chest:     Chest wall: No tenderness.  Musculoskeletal:     Cervical back: Neck supple. No rigidity.     Right lower leg: No edema.     Left lower leg: No edema.  Skin:    General: Skin is warm and dry.  Neurological:     General: No focal deficit present.     Mental Status: She is alert and  oriented to person, place, and time.  Psychiatric:        Mood and Affect: Mood normal.        Behavior: Behavior normal.     No results found for any visits on 10/06/23.      Assessment & Plan:   Deborah [name] was seen today for facial pain.  Diagnoses and all orders for this visit:  Acute non-recurrent frontal sinusitis Doxycyline as below.  -     doxycycline  (VIBRA -TABS) 100 MG tablet; Take 1 tablet (100 mg total) by mouth 2 (two) times daily for 7 days.  Mild intermittent asthma with acute exacerbation Prednisone  as below. Albuterol  prn.  -     predniSONE  (DELTASONE ) 20 MG tablet; Take 2 tablets (40 mg total) by mouth daily with breakfast for 5 days. -     albuterol  (VENTOLIN  HFA) 108 (90 Base) MCG/ACT inhaler; Inhale 2 puffs into the lungs every 6 (six) hours as needed for  wheezing or shortness of breath.  TMJ Discussed heat, rest, antiinflammatories. Discussed OTC nightguard.   Return to office for new or worsening symptoms, or if symptoms persist.   The patient indicates understanding of these issues and agrees with the plan.  Deborah [name] Search, FNP

## 2023-10-28 NOTE — Progress Notes (Signed)
 Erroneous encounter

## 2023-11-12 ENCOUNTER — Encounter: Payer: Self-pay | Admitting: Family Medicine

## 2023-11-14 ENCOUNTER — Telehealth: Payer: Self-pay

## 2023-11-14 NOTE — Telephone Encounter (Signed)
 Copied from CRM #[id number]. Topic: Clinical - Medication Question >> Nov 14, 2023  9:27 AM Carrielelia G wrote:  Patient is requesting a Covid shot RX for the [facility]   Please advise.

## 2023-11-14 NOTE — Telephone Encounter (Signed)
 Mychart message sent to provider

## 2023-11-18 ENCOUNTER — Encounter: Payer: Self-pay | Admitting: Family Medicine

## 2023-12-05 ENCOUNTER — Ambulatory Visit: Admitting: Family Medicine

## 2023-12-08 ENCOUNTER — Ambulatory Visit: Admitting: Family Medicine

## 2023-12-08 ENCOUNTER — Encounter: Payer: Self-pay | Admitting: Family Medicine

## 2023-12-08 ENCOUNTER — Ambulatory Visit (INDEPENDENT_AMBULATORY_CARE_PROVIDER_SITE_OTHER): Admitting: Family Medicine

## 2023-12-08 VITALS — BP 108/58 | HR 85 | Temp 98.4°F | Ht 66.0 in | Wt 132.4 lb

## 2023-12-08 DIAGNOSIS — J208 Acute bronchitis due to other specified organisms: Secondary | ICD-10-CM

## 2023-12-08 DIAGNOSIS — B9689 Other specified bacterial agents as the cause of diseases classified elsewhere: Secondary | ICD-10-CM

## 2023-12-08 DIAGNOSIS — G629 Polyneuropathy, unspecified: Secondary | ICD-10-CM | POA: Diagnosis not present

## 2023-12-08 DIAGNOSIS — J4521 Mild intermittent asthma with (acute) exacerbation: Secondary | ICD-10-CM | POA: Diagnosis not present

## 2023-12-08 MED ORDER — PREDNISONE 20 MG PO TABS: 40.0000 mg | ORAL_TABLET | Freq: Every day | ORAL | 0 refills | Status: AC

## 2023-12-08 MED ORDER — AZITHROMYCIN 250 MG PO TABS: ORAL_TABLET | ORAL | 0 refills | Status: DC

## 2023-12-08 NOTE — Progress Notes (Signed)
 Established Patient Office Visit  Subjective   Patient ID: Aylah Yeary. Mees, female    DOB: May 09, 1945  Age: 78 y.o. MRN: [id number]  Chief Complaint  Patient presents with   Cough    HPI  History of Present Illness   Dezarai Prew. Done Holley is a 78 year old female with asthma who presents with cough and respiratory symptoms.  Cough and upper respiratory symptoms - Cough present for 10 days - Productive of yellowish sputum - Sensation of postnasal drip with mucus descending from head to throat, requiring frequent coughing to clear - No fever - Hot flashes and chills lasting 3-4 minutes - Runny nose with clear nasal discharge - Occasional sore throat when attempting to clear mucus  Asthma and lower respiratory symptoms - History of asthma - Albuterol  inhaler use once daily, typically in the morning, with additional use in afternoon or evening if wheezing occurs - Intermittent wheezing - Occasional chest tightness and heaviness - No significant shortness of breath - History of pneumonia and frequent bronchitis - Previous treatment with prednisone  for sinus issues, which provided partial relief  Neuropathic pain - History of chronic intercostal nerve irritation and neuropathy - No current neuropathic pain - Previous episodes described as brief, electrical shock-like sensations - She brought a copy of an office visit from a pain specialist from [facility] clinic regarding this  Cardiac symptoms - Atenolol  use for history of irregular heartbeat - Irregular heartbeat previously monitored and deemed benign - No recent cardiac symptoms         ROS As per HPI.    Objective:     BP (!) 108/58   Pulse 85   Temp 98.4 F (36.9 C) (Temporal)   Ht 5' 6 (1.676 m)   Wt 132 lb 6.4 oz (60.1 kg)   SpO2 98%   BMI 21.37 kg/m    Physical Exam Vitals and nursing note reviewed.  Constitutional:      General: She is not in acute distress.    Appearance: She is not  ill-appearing, toxic-appearing or diaphoretic.  HENT:     Head: Normocephalic and atraumatic.     Right Ear: Tympanic membrane, ear canal and external ear normal.     Left Ear: Tympanic membrane, ear canal and external ear normal.     Nose: Congestion present.     Mouth/Throat:     Mouth: Mucous membranes are moist.     Pharynx: Oropharynx is clear. No oropharyngeal exudate or posterior oropharyngeal erythema.  Eyes:     General:        Right eye: No discharge.        Left eye: No discharge.  Cardiovascular:     Rate and Rhythm: Normal rate and regular rhythm.     Heart sounds: Normal heart sounds. No murmur heard. Pulmonary:     Effort: Pulmonary effort is normal. No respiratory distress.     Breath sounds: No stridor. Examination of the left-middle field reveals wheezing. Examination of the left-lower field reveals wheezing. Wheezing present. No decreased breath sounds, rhonchi or rales.  Chest:     Chest wall: No tenderness.  Abdominal:     General: There is no distension.     Palpations: Abdomen is soft.     Tenderness: There is no abdominal tenderness. There is no guarding or rebound.  Musculoskeletal:     Cervical back: Neck supple. No rigidity.     Right lower leg: No edema.     Left lower  leg: No edema.  Skin:    General: Skin is warm and dry.  Neurological:     General: No focal deficit present.     Mental Status: She is alert and oriented to person, place, and time.  Psychiatric:        Mood and Affect: Mood normal.        Behavior: Behavior normal.      No results found for any visits on 12/08/23.    The 10-year ASCVD risk score (Arnett DK, et al., 2019) is: 13%    Assessment & Plan:   Izzy Doubek was seen today for cough.  Diagnoses and all orders for this visit:  Acute bacterial bronchitis -     predniSONE  (DELTASONE ) 20 MG tablet; Take 2 tablets (40 mg total) by mouth daily with breakfast for 5 days. -     azithromycin (ZITHROMAX Z-PAK) 250 MG  tablet; As directed  Mild intermittent asthma with acute exacerbation  Neuropathy      Acute bronchitis in a patient with asthma Symptoms consistent with acute bronchitis, including productive cough, wheezing, and chest tightness. Asthma managed with albuterol . No fever, but hot flashes and chills present. Examination supports bronchitis, no pneumonia. - Prescribed prednisone  40 mg once daily for 5 days. - Prescribed azithromycin (Z-Pak). - Continue albuterol  inhaler as needed. - Advised to contact office if symptoms worsen or do not improve. - Consider chest x-ray if symptoms do not improve or worsen.  Chronic intercostal nerve irritation with neuropathy Chronic intercostal nerve irritation likely from prior operation. Recent illness may have contributed to recurrence. - Monitor for recurrence. - Consider intercostal nerve ablation if pain becomes persistent.   Return to office for new or worsening symptoms, or if symptoms persist.   The patient indicates understanding of these issues and agrees with the plan.   [name] Search, FNP

## 2023-12-12 ENCOUNTER — Ambulatory Visit: Admitting: Family Medicine

## 2023-12-19 ENCOUNTER — Ambulatory Visit: Admitting: Family Medicine

## 2023-12-20 ENCOUNTER — Other Ambulatory Visit: Payer: Self-pay | Admitting: Family Medicine

## 2023-12-20 DIAGNOSIS — R000 Tachycardia, unspecified: Secondary | ICD-10-CM

## 2023-12-20 DIAGNOSIS — F411 Generalized anxiety disorder: Secondary | ICD-10-CM

## 2024-01-18 ENCOUNTER — Encounter: Payer: Self-pay | Admitting: Family Medicine

## 2024-01-18 ENCOUNTER — Ambulatory Visit: Payer: Self-pay | Admitting: Family Medicine

## 2024-01-18 DIAGNOSIS — J0140 Acute pansinusitis, unspecified: Secondary | ICD-10-CM

## 2024-01-18 DIAGNOSIS — M8580 Other specified disorders of bone density and structure, unspecified site: Secondary | ICD-10-CM | POA: Diagnosis not present

## 2024-01-18 DIAGNOSIS — F411 Generalized anxiety disorder: Secondary | ICD-10-CM | POA: Diagnosis not present

## 2024-01-18 DIAGNOSIS — Z0000 Encounter for general adult medical examination without abnormal findings: Secondary | ICD-10-CM

## 2024-01-18 DIAGNOSIS — G20C Parkinsonism, unspecified: Secondary | ICD-10-CM | POA: Diagnosis not present

## 2024-01-18 DIAGNOSIS — I700 Atherosclerosis of aorta: Secondary | ICD-10-CM

## 2024-01-18 DIAGNOSIS — C3491 Malignant neoplasm of unspecified part of right bronchus or lung: Secondary | ICD-10-CM | POA: Diagnosis not present

## 2024-01-18 DIAGNOSIS — K219 Gastro-esophageal reflux disease without esophagitis: Secondary | ICD-10-CM

## 2024-01-18 DIAGNOSIS — Z130 Encounter for screening for diseases of the blood and blood-forming organs and certain disorders involving the immune mechanism: Secondary | ICD-10-CM

## 2024-01-18 LAB — CBC WITH DIFFERENTIAL/PLATELET
Basophils Absolute: 0.1 x10E3/uL (ref 0.0–0.2)
Basos: 1 %
EOS (ABSOLUTE): 0.2 x10E3/uL (ref 0.0–0.4)
Eos: 5 %
Hematocrit: 42.1 % (ref 34.0–46.6)
Hemoglobin: 13.7 g/dL (ref 11.1–15.9)
Immature Grans (Abs): 0 x10E3/uL (ref 0.0–0.1)
Immature Granulocytes: 0 %
Lymphocytes Absolute: 1.4 x10E3/uL (ref 0.7–3.1)
Lymphs: 32 %
MCH: 31.2 pg (ref 26.6–33.0)
MCHC: 32.5 g/dL (ref 31.5–35.7)
MCV: 96 fL (ref 79–97)
Monocytes Absolute: 0.5 x10E3/uL (ref 0.1–0.9)
Monocytes: 11 %
Neutrophils Absolute: 2.2 x10E3/uL (ref 1.4–7.0)
Neutrophils: 51 %
Platelets: 336 x10E3/uL (ref 150–450)
RBC: 4.39 x10E6/uL (ref 3.77–5.28)
RDW: 12.6 % (ref 11.7–15.4)
WBC: 4.4 x10E3/uL (ref 3.4–10.8)

## 2024-01-18 LAB — CMP14+EGFR
ALT: 17 IU/L (ref 0–32)
AST: 31 IU/L (ref 0–40)
Albumin: 4.6 g/dL (ref 3.8–4.8)
Alkaline Phosphatase: 58 IU/L (ref 49–135)
BUN/Creatinine Ratio: 13 (ref 12–28)
BUN: 12 mg/dL (ref 8–27)
Bilirubin Total: 0.5 mg/dL (ref 0.0–1.2)
CO2: 26 mmol/L (ref 20–29)
Calcium: 9.9 mg/dL (ref 8.7–10.3)
Chloride: 103 mmol/L (ref 96–106)
Creatinine, Ser: 0.92 mg/dL (ref 0.57–1.00)
Globulin, Total: 2.2 g/dL (ref 1.5–4.5)
Glucose: 91 mg/dL (ref 70–99)
Potassium: 4.5 mmol/L (ref 3.5–5.2)
Sodium: 141 mmol/L (ref 134–144)
Total Protein: 6.8 g/dL (ref 6.0–8.5)
eGFR: 64 mL/min/1.73 (ref >59)

## 2024-01-18 LAB — LIPID PANEL
Chol/HDL Ratio: 2.4 ratio (ref 0.0–4.4)
Cholesterol, Total: 144 mg/dL (ref 100–199)
HDL: 60 mg/dL (ref >39)
LDL Chol Calc (NIH): 70 mg/dL (ref 0–99)
Triglycerides: 72 mg/dL (ref 0–149)
VLDL Cholesterol Cal: 14 mg/dL (ref 5–40)

## 2024-01-18 LAB — TSH: TSH: 2.61 u[IU]/mL (ref 0.450–4.500)

## 2024-01-18 MED ORDER — DOXYCYCLINE HYCLATE 100 MG PO TABS: 100.0000 mg | ORAL_TABLET | Freq: Two times a day (BID) | ORAL | 0 refills | Status: AC

## 2024-01-18 MED ORDER — FAMOTIDINE 20 MG PO TABS: 20.0000 mg | ORAL_TABLET | Freq: Two times a day (BID) | ORAL | 3 refills | Status: AC | PRN

## 2024-01-18 NOTE — Progress Notes (Signed)
 Complete physical exam  Patient: Deborah [name]. Dinse   DOB: 01-15-1946   78 y.o. Female  MRN: [id number]  Subjective:    Chief Complaint  Patient presents with   Annual Exam    Deborah [name]. Herrmann is a 78 y.o. female who presents today for a complete physical exam. She reports consuming a general diet. Home exercise routine includes walking, stairs, squats daily. She generally feels well. She reports sleeping well. She does have additional problems to discuss today.   Upper respiratory symptoms - Persistent nasal and head congestion for 6 weeks. Improved then worsened again a few weeks ago - sinus pressure present - Sore throat attributed to postnasal drainage - Clear, thick sputum production, particularly at night - Crackling sounds in the afternoon - Limited relief with over-the-counter medications including Tylenol  Sinus, Alka Seltzer, and NyQuil - Previously treated with a Z-Pak and prednisone . Cough improved after this treatment  Gastrointestinal and throat symptoms - Occasional bloating after eating - Occasional epigastric pain - No dysphagia - Nocturnal sensation of abdominal bubbling, resolving without pain - No daytime pain or discomfort - Normal bowel and bladder function - Stopped PPI due to potential risks  Osteopenia and medication intolerance - History of osteopenia with high risk fracture - Previously treated with Fosamax , discontinued due to adverse effects (specifics not recalled) - Not currently taking medication for osteopenia  Lifestyle and functional status - Maintains a healthy diet - Exercises five days per week, including treadmill, walking, stairs, and squats - Wakes early and goes to bed by 7:30 PM      Most recent fall risk assessment:    01/18/2024    8:06 AM  Fall Risk   Falls in the past year? 0     Most recent depression screenings:    01/18/2024    8:06 AM 12/08/2023   11:26 AM 10/06/2023    8:26 AM  Depression screen PHQ 2/9   Decreased Interest 0 0 0  Down, Depressed, Hopeless 0 0 0  PHQ - 2 Score 0 0 0  Altered sleeping 0 0 0  Tired, decreased energy 0 0 0  Change in appetite 0 0 0  Feeling bad or failure about yourself  0 0 0  Trouble concentrating 0 0 0  Moving slowly or fidgety/restless 0 0 0  Suicidal thoughts 0 0 0  PHQ-9 Score 0 0  0   Difficult doing work/chores Not difficult at all Not difficult at all Not difficult at all     Data saved with a previous flowsheet row definition      01/18/2024    8:07 AM 12/08/2023   11:27 AM 10/06/2023    8:27 AM 09/01/2023    9:12 AM  GAD 7 : Generalized Anxiety Score  Nervous, Anxious, on Edge 0 0 0 0  Control/stop worrying 0 0 0 0  Worry too much - different things 0 0 0 0  Trouble relaxing 0 0 0 0  Restless 0 0 0 0  Easily annoyed or irritable 0 0 0 0  Afraid - awful might happen 0 0 0 0  Total GAD 7 Score 0 0 0 0  Anxiety Difficulty Not difficult at all Not difficult at all Not difficult at all Not difficult at all           Patient Care Team: Joesph Annabella HERO, FNP as PCP - General (Family Medicine) Celestia Joesph SQUIBB, RN as Oncology Nurse Navigator (Oncology)   Outpatient Medications Prior to  Visit  Medication Sig   albuterol  (VENTOLIN  HFA) 108 (90 Base) MCG/ACT inhaler Inhale 2 puffs into the lungs every 6 (six) hours as needed for wheezing or shortness of breath.   atenolol  (TENORMIN ) 25 MG tablet TAKE 1 TABLET (25 MG TOTAL) BY MOUTH DAILY.   Calcium  Carbonate-Vit D-Min (CALCIUM  1200 PO) Take 1,200 mg by mouth daily.   cetirizine (ZYRTEC) 10 MG tablet Take 10 mg by mouth daily.   cholecalciferol (VITAMIN D3) 25 MCG (1000 UNIT) tablet Take 1,000 Units by mouth daily.   Coenzyme Q10 (CO Q 10 PO) Take by mouth.   escitalopram  (LEXAPRO ) 20 MG tablet TAKE 1 TABLET BY MOUTH EVERY DAY   Multiple Vitamin (MULTIVITAMIN PO) Take by mouth.   Probiotic Product (PROBIOTIC PO) Take by mouth.   Red Yeast Rice Extract (RED YEAST RICE PO) Take by  mouth.   rosuvastatin  (CRESTOR ) 5 MG tablet TAKE 1 TABLET (5 MG TOTAL) BY MOUTH DAILY.   alendronate  (FOSAMAX ) 70 MG tablet Take 1 tablet (70 mg total) by mouth every 7 (seven) days. Take with a full glass of water on an empty stomach. (Patient not taking: Reported on 01/18/2024)   omeprazole  (PRILOSEC) 20 MG capsule Take 1 capsule (20 mg total) by mouth daily. (Patient not taking: Reported on 01/18/2024)   [DISCONTINUED] azithromycin (ZITHROMAX Z-PAK) 250 MG tablet As directed   No facility-administered medications prior to visit.    ROS Negative unless specially indicated above in HPI.     Objective:     BP (!) 115/55   Pulse 83   Temp 98.4 F (36.9 C) (Temporal)   Ht 5' 6 (1.676 m)   Wt 129 lb 12.8 oz (58.9 kg)   SpO2 100%   BMI 20.95 kg/m    Physical Exam Vitals and nursing note reviewed.  Constitutional:      General: She is not in acute distress.    Appearance: Normal appearance. She is not ill-appearing, toxic-appearing or diaphoretic.  HENT:     Head: Normocephalic.     Right Ear: Tympanic membrane, ear canal and external ear normal.     Left Ear: Tympanic membrane, ear canal and external ear normal.     Nose: Congestion present.     Right Sinus: Maxillary sinus tenderness and frontal sinus tenderness present.     Left Sinus: Maxillary sinus tenderness and frontal sinus tenderness present.     Mouth/Throat:     Mouth: Mucous membranes are moist.     Pharynx: Oropharynx is clear. No pharyngeal swelling, oropharyngeal exudate or posterior oropharyngeal erythema.     Tonsils: No tonsillar exudate or tonsillar abscesses. 1+ on the right. 1+ on the left.  Eyes:     Extraocular Movements: Extraocular movements intact.     Conjunctiva/sclera: Conjunctivae normal.     Pupils: Pupils are equal, round, and reactive to light.  Cardiovascular:     Rate and Rhythm: Normal rate and regular rhythm.     Pulses: Normal pulses.     Heart sounds: Normal heart sounds. No murmur  heard.    No friction rub. No gallop.  Pulmonary:     Effort: Pulmonary effort is normal. No respiratory distress.     Breath sounds: Normal breath sounds. No wheezing, rhonchi or rales.  Abdominal:     General: Bowel sounds are normal. There is no distension.     Palpations: Abdomen is soft. There is no mass.     Tenderness: There is no abdominal tenderness. There is no  guarding.  Musculoskeletal:        General: No tenderness.     Cervical back: Normal range of motion and neck supple. No tenderness.     Right lower leg: No edema.     Left lower leg: No edema.  Lymphadenopathy:     Cervical: No cervical adenopathy.  Skin:    General: Skin is warm and dry.     Capillary Refill: Capillary refill takes less than 2 seconds.     Findings: No lesion or rash.  Neurological:     General: No focal deficit present.     Mental Status: She is alert and oriented to person, place, and time.     Cranial Nerves: No cranial nerve deficit.     Motor: No weakness.     Coordination: Coordination normal.     Gait: Gait normal.  Psychiatric:        Mood and Affect: Mood normal.        Behavior: Behavior normal.        Thought Content: Thought content normal.        Judgment: Judgment normal.      No results found for any visits on 01/18/24.     Assessment & Plan:    Routine Health Maintenance and Physical Exam  Jaylyn Iyer was seen today for annual exam.  Diagnoses and all orders for this visit:  Routine general medical examination at a health care facility  Parkinsonism, unspecified Parkinsonism type (HCC)  Adenocarcinoma of right lung (HCC) -     CBC with Differential/Platelet  Osteopenia with high risk of fracture -     AMB Referral VBCI Care Management  Generalized anxiety disorder  Aortic atherosclerosis -     Lipid panel  Gastroesophageal reflux disease, unspecified whether esophagitis present -     famotidine (PEPCID) 20 MG tablet; Take 1 tablet (20 mg total) by mouth  2 (two) times daily as needed for heartburn or indigestion. -     AMB Referral VBCI Care Management  Acute non-recurrent pansinusitis -     doxycycline  (VIBRA -TABS) 100 MG tablet; Take 1 tablet (100 mg total) by mouth 2 (two) times daily for 7 days. 1 po bid  Screening for endocrine, metabolic and immunity disorder -     CMP14+EGFR -     TSH   Assessment and Plan    Adult Wellness Visit Routine wellness visit with no specific concerns. - Ordered CBC. - Scheduled follow-up in six months.  Acute pansinusitis Recurrent sinus congestion and pressure. Doxycycline  chosen due to previous tolerance and effectiveness. - Prescribed doxycycline  twice daily for one week. - Advised taking doxycycline  with food.  Gastroesophageal reflux disease Intermittent symptoms possibly related to acid reflux.  - Prescribed Pepcid (famotidine) as needed or twice daily.  Osteopenia with high risk fracture Previous intolerance to Fosamax . Hx of GERD. Interested in alternative treatments. - Referred to clinical pharmacist Mliss for osteopenia management.     Parkinsonism Managed by neurology. Not currently on medication  Adenocarcinoma of lung Managed by oncology. S/p right lower lobectomy. CT in January without reoccurrence  Anxiety Well controlled with lexapro .   Aortic atherosclerosis  On statin.    Immunization History  Administered Date(s) Administered   DT (Pediatric) 01/27/1999   Fluad Quad(high Dose 65+) 11/03/2020   INFLUENZA, HIGH DOSE SEASONAL PF 11/08/2013, 11/30/2014, 10/29/2016, 10/18/2017, 10/20/2018, 10/23/2019, 11/04/2020, 11/07/2022   Influenza Split 10/23/2019   Influenza,trivalent, recombinat, inj, PF 11/07/2022   Influenza-Unspecified 12/28/2007, 10/29/2016, 10/20/2018, 10/29/2021, 11/16/2023  Moderna Covid-19 Fall Seasonal Vaccine 4yrs & older 11/07/2022   Moderna Covid-19 Vaccine Bivalent Booster 52yrs & up 10/29/2021   Moderna Sars-Covid-2 Vaccination 05/12/2019,  06/09/2019, 12/29/2019, 11/07/2022   Pneumococcal Conjugate-13 04/20/2013   Pneumococcal Polysaccharide-23 12/13/2013   RSV,unspecified 11/23/2022   Td 01/27/1999   Tdap 10/03/2012, 11/07/2022   Unspecified SARS-COV-2 Vaccination 11/16/2023   Zoster, Live 12/28/2011    Health Maintenance  Topic Date Due   Zoster Vaccines- Shingrix (1 of 2) 03/09/2024 (Originally [birth year]/1966)   COVID-19 Vaccine (7 - Moderna risk 2025-26 season) 05/15/2024   Medicare Annual Wellness (AWV)  10/04/2024   DEXA SCAN  12/29/2024   DTaP/Tdap/Td (5 - Td or Tdap) 11/06/2032   Pneumococcal Vaccine: 50+ Years  Completed   Influenza Vaccine  Completed   Hepatitis C Screening  Completed   Meningococcal B Vaccine  Aged Out   Mammogram  Discontinued   Colonoscopy  Discontinued    Discussed health benefits of physical activity, and encouraged her to engage in regular exercise appropriate for her age and condition.  Problem List Items Addressed This Visit       Cardiovascular and Mediastinum   Aortic atherosclerosis   Relevant Orders   Lipid panel     Respiratory   Adenocarcinoma of right lung (HCC)   Relevant Medications   doxycycline  (VIBRA -TABS) 100 MG tablet   Other Relevant Orders   CBC with Differential/Platelet     Nervous and Auditory   Parkinsonism (HCC)     Musculoskeletal and Integument   Osteopenia with high risk of fracture   Relevant Orders   AMB Referral VBCI Care Management     Other   Generalized anxiety disorder   Other Visit Diagnoses       Routine general medical examination at a health care facility         Gastroesophageal reflux disease, unspecified whether esophagitis present       Relevant Medications   Probiotic Product (PROBIOTIC PO)   famotidine (PEPCID) 20 MG tablet   Other Relevant Orders   AMB Referral VBCI Care Management     Acute non-recurrent pansinusitis       Relevant Medications   doxycycline  (VIBRA -TABS) 100 MG tablet     Screening for endocrine,  metabolic and immunity disorder       Relevant Orders   CMP14+EGFR   TSH      Return in about 6 months (around 07/17/2024) for chronic follow up.   The patient indicates understanding of these issues and agrees with the plan.  [name] Search, FNP

## 2024-01-18 NOTE — Patient Instructions (Signed)

## 2024-01-19 ENCOUNTER — Ambulatory Visit: Payer: Self-pay | Admitting: Family Medicine

## 2024-01-23 ENCOUNTER — Telehealth: Payer: Self-pay

## 2024-01-23 NOTE — Progress Notes (Signed)
 error

## 2024-01-23 NOTE — Progress Notes (Signed)
 Care Guide Pharmacy Note  01/23/2024 Name: Lyriq Finerty. Novelo MRN: [id number] DOB: June 16, 1945  Referred By: Joesph Annabella HERO, FNP Reason for referral: Complex Care Management (Outreach to schedule with Pharm d )   Teofila K. Benedicto is a 78 y.o. year old female who is a primary care patient of Joesph Annabella HERO, FNP.  Tannisha K. Eichel was referred to the pharmacist for assistance related to: osteopenia   Successful contact was made with the patient to discuss pharmacy services including being ready for the pharmacist to call at least 5 minutes before the scheduled appointment time and to have medication bottles and any blood pressure readings ready for review. The patient agreed to meet with the pharmacist via telephone visit on (date/time).02/14/2024  Jeoffrey Buffalo , RMA     [place]  [facility], [facility] Guide  Direct Dial: [phone]  Website: [email or web address]

## 2024-02-05 ENCOUNTER — Encounter: Payer: Self-pay | Admitting: Family Medicine

## 2024-02-06 ENCOUNTER — Ambulatory Visit: Admitting: Family Medicine

## 2024-02-08 ENCOUNTER — Ambulatory Visit: Admitting: Family Medicine

## 2024-02-09 ENCOUNTER — Ambulatory Visit: Admitting: Family Medicine

## 2024-02-09 ENCOUNTER — Encounter: Payer: Self-pay | Admitting: Family Medicine

## 2024-02-09 VITALS — BP 106/58 | HR 66 | Temp 98.9°F | Ht 66.0 in | Wt 134.0 lb

## 2024-02-09 DIAGNOSIS — J988 Other specified respiratory disorders: Secondary | ICD-10-CM

## 2024-02-09 MED ORDER — PREDNISONE 20 MG PO TABS: 40.0000 mg | ORAL_TABLET | Freq: Every day | ORAL | 0 refills | Status: AC

## 2024-02-09 MED ORDER — LEVOFLOXACIN 500 MG PO TABS: 500.0000 mg | ORAL_TABLET | Freq: Every day | ORAL | 0 refills | Status: AC

## 2024-02-09 NOTE — Progress Notes (Signed)
 Acute Office Visit  Subjective:     Patient ID: Deborah [name]. Halleck, female    DOB: [birth year], 78 y.o.   MRN: [id number]  Chief Complaint  Patient presents with   Cough    HPI  History of Present Illness   Deborah [name]. Walberg Holley is a 78 year old female who presents with a persistent cough and respiratory symptoms.  She has experienced a persistent productive cough for the past two months, with yellowish to clear sputum, particularly at night. The cough requires repeated efforts to clear her airways, temporarily improving her breathing. Her throat is sore and raspy, especially after coughing episodes. She also experiences a sensation of being in a 'fog' and sinus pressure around her cheeks, though it has been worse in the past.  She has been treated with a Z-Pak and doxycycline , with only slight improvement. After discontinuing antibiotics, her symptoms neither worsened nor improved. She uses over-the-counter medications such as Zyrtec, Alka-Seltzer, Tylenol , NyQuil, and cough drops, which provide some relief for her sore throat but no significant improvement in her cough or congestion.  She reports crackling in her ears, a usual occurrence for her, and suspects postnasal drip as a contributing factor to her chest congestion. No wheezing or shortness of breath, but she describes chest tightness. She has used an albuterol  inhaler without relief. No recent fever, chills, nausea, or vomiting, but she feels fatigued, particularly when climbing stairs, which she previously used for exercise.  She has a history of a penicillin allergy but was tested at one point without a reaction and has tolerated penicillin since. She has a reported allergy to ciprofloxacin but doesn't recall this.       ROS As per HPI.      Objective:    BP (!) 106/58   Pulse 66   Temp 98.9 F (37.2 C) (Temporal)   Ht 5' 6 (1.676 m)   Wt 134 lb (60.8 kg)   SpO2 98%   BMI 21.63 kg/m    Physical Exam Vitals  and nursing note reviewed.  Constitutional:      General: She is not in acute distress.    Appearance: She is not ill-appearing, toxic-appearing or diaphoretic.  HENT:     Head: Normocephalic and atraumatic.     Right Ear: Ear canal and external ear normal. A middle ear effusion is present.     Left Ear: Ear canal and external ear normal. A middle ear effusion is present.     Nose: Congestion present.     Mouth/Throat:     Mouth: Mucous membranes are moist.     Pharynx: Oropharynx is clear. No oropharyngeal exudate or posterior oropharyngeal erythema.  Eyes:     General:        Right eye: No discharge.        Left eye: No discharge.  Cardiovascular:     Rate and Rhythm: Normal rate and regular rhythm.     Heart sounds: Normal heart sounds. No murmur heard. Pulmonary:     Effort: Pulmonary effort is normal. No respiratory distress.     Breath sounds: No stridor. Examination of the left-upper field reveals rhonchi. Examination of the right-middle field reveals rhonchi. Examination of the left-middle field reveals rhonchi. Examination of the right-lower field reveals rhonchi. Examination of the left-lower field reveals rhonchi. Rhonchi present. No decreased breath sounds, wheezing or rales.  Chest:     Chest wall: No tenderness.  Abdominal:     General: There is  no distension.     Palpations: Abdomen is soft.     Tenderness: There is no abdominal tenderness. There is no guarding or rebound.  Musculoskeletal:     Cervical back: Neck supple. No rigidity.     Right lower leg: No edema.     Left lower leg: No edema.  Skin:    General: Skin is warm and dry.  Neurological:     General: No focal deficit present.     Mental Status: She is alert and oriented to person, place, and time.  Psychiatric:        Mood and Affect: Mood normal.        Behavior: Behavior normal.     No results found for any visits on 02/09/24.      Assessment & Plan:   [name] was seen today for  cough.  Diagnoses and all orders for this visit:  Respiratory infection -     levofloxacin (LEVAQUIN) 500 MG tablet; Take 1 tablet (500 mg total) by mouth daily for 7 days. -     predniSONE  (DELTASONE ) 20 MG tablet; Take 2 tablets (40 mg total) by mouth daily with breakfast for 5 days.   Assessment and Plan    Respiratory infection Persistent cough and sputum production with minimal improvement after doxycycline . Differential includes pneumonia. Chest x-ray unavailable in office today. She is unable to travel for Xray today.  - Prescribed Levaquin 1 tablet daily for 7 days. - Prescribed prednisone  burst: 2 tablets daily for 5 days  - Instructed to monitor for rash or adverse reactions to Levaquin. - Advised to continue Zyrtec and use Flonase for postnasal drip. - If no improvement by Monday, schedule chest x-ray.       Return if symptoms worsen or fail to improve.  The patient indicates understanding of these issues and agrees with the plan.  Deborah [name] Search, FNP

## 2024-02-14 ENCOUNTER — Other Ambulatory Visit

## 2024-02-20 ENCOUNTER — Encounter: Admitting: Family Medicine

## 2024-02-27 ENCOUNTER — Other Ambulatory Visit: Payer: Self-pay

## 2024-02-27 ENCOUNTER — Encounter: Payer: Self-pay | Admitting: Family Medicine

## 2024-02-27 DIAGNOSIS — J4521 Mild intermittent asthma with (acute) exacerbation: Secondary | ICD-10-CM

## 2024-02-27 MED ORDER — ALBUTEROL SULFATE HFA 108 (90 BASE) MCG/ACT IN AERS: 2.0000 | INHALATION_SPRAY | Freq: Four times a day (QID) | RESPIRATORY_TRACT | 0 refills | Status: AC | PRN

## 2024-03-16 ENCOUNTER — Other Ambulatory Visit: Payer: Self-pay | Admitting: Family Medicine

## 2024-03-16 DIAGNOSIS — R000 Tachycardia, unspecified: Secondary | ICD-10-CM

## 2024-03-16 DIAGNOSIS — F411 Generalized anxiety disorder: Secondary | ICD-10-CM

## 2024-03-16 NOTE — Telephone Encounter (Signed)
 Appt scheduled for 07/09/2024

## 2024-03-16 NOTE — Telephone Encounter (Signed)
 Tiffany NTBS in May for 6 mos FU RFs sent to pharmacy

## 2024-03-20 ENCOUNTER — Other Ambulatory Visit: Payer: Self-pay

## 2024-03-20 DIAGNOSIS — C3491 Malignant neoplasm of unspecified part of right bronchus or lung: Secondary | ICD-10-CM

## 2024-03-21 ENCOUNTER — Ambulatory Visit (HOSPITAL_COMMUNITY)
Admission: RE | Admit: 2024-03-21 | Discharge: 2024-03-21 | Disposition: A | Discharge status: Home / Self Care | Source: Ambulatory Visit | Attending: Hematology | Admitting: Hematology

## 2024-03-21 ENCOUNTER — Inpatient Hospital Stay: Attending: Physician Assistant

## 2024-03-21 DIAGNOSIS — C3491 Malignant neoplasm of unspecified part of right bronchus or lung: Secondary | ICD-10-CM | POA: Insufficient documentation

## 2024-03-21 LAB — CBC WITH DIFFERENTIAL/PLATELET
Abs Immature Granulocytes: 0.01 K/uL (ref 0.00–0.07)
Basophils Absolute: 0.1 K/uL (ref 0.0–0.1)
Basophils Relative: 1 %
Eosinophils Absolute: 0.3 K/uL (ref 0.0–0.5)
Eosinophils Relative: 3 %
HCT: 40.6 % (ref 36.0–46.0)
Hemoglobin: 13.1 g/dL (ref 12.0–15.0)
Immature Granulocytes: 0 %
Lymphocytes Relative: 25 %
Lymphs Abs: 2.2 K/uL (ref 0.7–4.0)
MCH: 31.1 pg (ref 26.0–34.0)
MCHC: 32.3 g/dL (ref 30.0–36.0)
MCV: 96.4 fL (ref 80.0–100.0)
Monocytes Absolute: 0.8 K/uL (ref 0.1–1.0)
Monocytes Relative: 9 %
Neutro Abs: 5.5 K/uL (ref 1.7–7.7)
Neutrophils Relative %: 62 %
Platelets: 317 K/uL (ref 150–400)
RBC: 4.21 MIL/uL (ref 3.87–5.11)
RDW: 13.7 % (ref 11.5–15.5)
WBC: 8.8 K/uL (ref 4.0–10.5)
nRBC: 0 % (ref 0.0–0.2)

## 2024-03-21 LAB — COMPREHENSIVE METABOLIC PANEL WITH GFR
ALT: 14 U/L (ref 0–44)
AST: 30 U/L (ref 15–41)
Albumin: 4.4 g/dL (ref 3.5–5.0)
Alkaline Phosphatase: 56 U/L (ref 38–126)
Anion gap: 12 (ref 5–15)
BUN: 21 mg/dL (ref 8–23)
CO2: 26 mmol/L (ref 22–32)
Calcium: 9.6 mg/dL (ref 8.9–10.3)
Chloride: 101 mmol/L (ref 98–111)
Creatinine, Ser: 0.82 mg/dL (ref 0.44–1.00)
GFR, Estimated: >60 mL/min
Glucose, Bld: 88 mg/dL (ref 70–99)
Potassium: 4.3 mmol/L (ref 3.5–5.1)
Sodium: 139 mmol/L (ref 135–145)
Total Bilirubin: 0.4 mg/dL (ref 0.0–1.2)
Total Protein: 7.1 g/dL (ref 6.5–8.1)

## 2024-03-24 NOTE — Progress Notes (Unsigned)
 "  [facility] 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  PRIOR THERAPY: Right lower lobectomy in March 2020  CURRENT THERAPY: Surveillance  BRIEF ONCOLOGIC HISTORY:   Oncology History  Adenocarcinoma of right lung (HCC)  11/09/2020 Initial Diagnosis   Adenocarcinoma of right lung (HCC)   11/09/2020 Cancer Staging   Staging form: Lung, AJCC 8th Edition - Clinical stage from 11/09/2020: ycT1, cN0, cM0 - Signed by Rogers Hai, MD on 11/09/2020 Stage prefix: Post-therapy Response to neoadjuvant therapy: Complete response     CANCER STAGING: Cancer Staging  Adenocarcinoma of right lung [facility]) Staging form: Lung, AJCC 8th Edition - Clinical stage from 11/09/2020: ycT1, Pen[place], cM0 - Signed by Rogers Hai, MD on 11/09/2020   INTERVAL HISTORY:   Deborah [name], a 79 y.o. 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  [name] was last seen on 03/22/2023 by Dr. Rogers.  She is seen today with her husband, Georgette.  In the interim since last visit, she has not had any surgeries, hospitalizations, or changes in baseline health status.   At today's visit, she reports feeling well.  She  reports 100% energy and 100% appetite.   She  is maintaining stable weight at this time.  No new onset cough, hemoptysis, dyspnea, chest pain. She has intermittent arthritic pain in multiple joints, but denies any new onset or concerning bone pain. She reports antibiotics for sinus infection in December 2025, but denies any recurrent lung infections. No voice changes or unintentional weight loss.   ASSESSMENT & PLAN:  1.  Stage I (T1 aN0 M0) adenocarcinoma of the right lower lobe of the  lung: - Initial presentation with abnormal chest x-ray which was followed for 18 months followed by pulmonary evaluation in [place] . - Right lower lobectomy in March 2020, 1.2 cm primary. - Did not require any adjuvant chemotherapy. - Moved to [place] area in April 2022 to be closer to her daughter. - Hospitalized in June for sepsis.  CT scan of the chest, abdomen and pelvis with contrast on 08/15/2020 with no definite evidence of recurrence of malignancy.  - She denies any lung infections in the last 1 year. - No new onset pain or respiratory symptoms. - Most recent labs (03/21/2024) with normal CBC/D and CMP - CT chest without contrast (03/21/2024): Redemonstration of postsurgical changes from prior right lower lobectomy.  Stable pre-existing sub-4 mm lung nodules, no new or suspicious lung nodule.  No lung mass, consolidation, pleural effusion, or pneumothorax. - PLAN: RTC in 1 year for follow-up with repeat CT chest and labs NCCN GUIDELINES for SURVIVORSHIP & SURVEILLANCE of NON SMALL CELL LUNG CANCER after completion of definitive therapy (as of September 2025): - STAGE I-II (primary treatment included surgery +/- chemotherapy):  H&P and CT chest with/without contrast every 6 months for 2 to 3 years Then H&P and low-dose noncontrast enhanced CT annually - Cancer survivorship care - Smoking cessation, advice, counseling, and pharmacotherapy - FDG-PET/CT is not routinely indicated - Brain MRI as clinically indicated based on risk assessment - If concern for recurrence, FDG-PET/CT + MRI brain with contrast   2.  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  Social/family history: - Lives at home with her husband.  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  Smoked for 3 years, 3 to 4 cigarettes/day in her 57s. - Father had prostate cancer.  Brother was diagnosed with AML.   PLAN SUMMARY: >> CT chest around 03/22/2025 >> Labs in  1 year = CBC/D, CMP >> OFFICE visit in 1 year (1 week after labs/CT chest)    REVIEW OF SYSTEMS:   Review of Systems  Constitutional:  Negative for appetite change, chills, diaphoresis, fatigue, fever and unexpected weight change.  HENT:   Negative for lump/mass and nosebleeds.   Eyes:  Negative for eye problems.  Respiratory:  Negative for cough, hemoptysis and shortness of breath.   Cardiovascular:  Negative for chest pain, leg swelling and palpitations.  Gastrointestinal:  Negative for abdominal pain, blood in stool, constipation, diarrhea, nausea and vomiting.  Genitourinary:  Negative for hematuria.   Skin: Negative.   Neurological:  Negative for dizziness, headaches and light-headedness.  Hematological:  Does not bruise/bleed easily.    PHYSICAL EXAM:   Performance status (ECOG): 0 - Asymptomatic  There were no vitals filed for this visit. Wt Readings from Last 3 Encounters:  02/09/24 134 lb (60.8 kg)  01/18/24 129 lb 12.8 oz (58.9 kg)  12/08/23 132 lb 6.4 oz (60.1 kg)   Physical Exam Constitutional:      Appearance: Normal appearance. She is normal weight.  Cardiovascular:     Heart sounds: Normal heart sounds.  Pulmonary:     Breath sounds: Wheezing (faint wheezing in LLL) present.  Neurological:     General: No focal deficit present.     Mental Status: Mental status is at baseline.  Psychiatric:        Behavior: Behavior normal. Behavior is cooperative.      PAST MEDICAL/SURGICAL HISTORY:  Past Medical History:  Diagnosis Date   Allergy ?   Anxiety    Arthritis    Asthma    Cancer (HCC)    GERD (gastroesophageal reflux disease)    Hiatal hernia    Hyperlipidemia    Lung cancer (HCC)    Osteopenia    Parkinson's syndrome (HCC)    Raynaud's disease    Past Surgical History:  Procedure Laterality Date   BACK SURGERY  1995   Decompress disc lumbar   BREAST BIOPSY  1995   CATARACT EXTRACTION, BILATERAL Bilateral 2017   COLONOSCOPY  08/20/2015    repeat in 5 years   ESOPHAGOGASTRODUODENOSCOPY  2021   EYE SURGERY  2019   cataracs removed   LUNG REMOVAL, PARTIAL Right    REFRACTIVE SURGERY Left 08/24/2016    SOCIAL HISTORY:  Social History   Socioeconomic History   Marital status: Married    Spouse name: Vicenta   Number of children: 1   Years of education: Not on file   Highest education level: 12th grade  Occupational History   Occupation: retired  Tobacco Use   Smoking status: Former    Current packs/day: 0.00    Average packs/day: 0.3 packs/day for 3.0 years (0.8 ttl pk-yrs)    Types: Cigarettes    Start date: 07/23/1957    Quit date: 07/23/1960    Years since quitting: 63.7   Smokeless tobacco: Never  Vaping Use   Vaping status: Never Used  Substance and Sexual Activity   Alcohol use: Not Currently    Alcohol/week: 0.0 - 1.0 standard drinks of alcohol  Comment: maybe 1 or 2 beers a month   Drug use: Never   Sexual activity: Yes    Partners: Male    Birth control/protection: None  Other Topics Concern   Not on file  Social History Narrative   Moved here from [place]  with husband Vicenta beginning of 2022. Their child lives nearby.      Caffeine: none   Right handed   Social Drivers of Health   Tobacco Use: Medium Risk (02/09/2024)   Patient History    Smoking Tobacco Use: Former    Smokeless Tobacco Use: Never    Passive Exposure: Not on file  Financial Resource Strain: Low Risk (01/15/2024)   Overall Financial Resource Strain (CARDIA)    Difficulty of Paying Living Expenses: Not hard at all  Food Insecurity: Unknown (01/15/2024)   Epic    Worried About Programme Researcher, Broadcasting/film/video in the Last Year: Never true    [employer] of Food in the Last Year: Not on file  Transportation Needs: No Transportation Needs (01/15/2024)   Epic    Lack of Transportation (Medical): No    Lack of Transportation (Non-Medical): No  Physical Activity: Sufficiently Active (01/15/2024)   Exercise Vital Sign    Days of Exercise per  Week: 5 days    Minutes of Exercise per Session: 70 min  Stress: No Stress Concern Present (01/15/2024)   [employer] of Occupational Health - Occupational Stress Questionnaire    Feeling of Stress: Only a little  Social Connections: Moderately Isolated (01/15/2024)   Social Connection and Isolation Panel    Frequency of Communication with Friends and Family: More than three times a week    Frequency of Social Gatherings with Friends and Family: Twice a week    Attends Religious Services: Patient declined    Database Administrator or Organizations: No    Attends Engineer, Structural: Not on file    Marital Status: Married  Catering Manager Violence: Not At Risk (10/04/2022)   Humiliation, Afraid, Rape, and Kick questionnaire    Fear of Current or Ex-Partner: No    Emotionally Abused: No    Physically Abused: No    Sexually Abused: No  Depression (PHQ2-9): Low Risk (02/09/2024)   Depression (PHQ2-9)    PHQ-2 Score: 0  Alcohol Screen: Low Risk (01/15/2024)   Alcohol Screen    Last Alcohol Screening Score (AUDIT): 1  Housing: Low Risk (01/15/2024)   Epic    Unable to Pay for Housing in the Last Year: No    Number of Times Moved in the Last Year: 0    Homeless in the Last Year: No  Utilities: Not At Risk (10/04/2022)   AHC Utilities    Threatened with loss of utilities: No  Health Literacy: Adequate Health Literacy (10/04/2022)   B1300 Health Literacy    Frequency of need for help with medical instructions: Never    FAMILY HISTORY:  Family History  Problem Relation Age of Onset   Dementia Mother    Heart disease Father    Diabetes Father    Prostate cancer Father    Cancer Father    Diabetes Brother    Acute myelogenous leukemia Brother    Cancer Brother    Early death Brother    Parkinson's disease Neg Hx    Breast cancer Neg Hx     CURRENT MEDICATIONS:  Current Outpatient Medications  Medication Sig Dispense Refill   albuterol  (VENTOLIN  HFA) 108 (90 Base)  MCG/ACT inhaler  Inhale 2 puffs into the lungs every 6 (six) hours as needed for wheezing or shortness of breath. 8 g 0   atenolol  (TENORMIN ) 25 MG tablet TAKE 1 TABLET (25 MG TOTAL) BY MOUTH DAILY. 90 tablet 1   Calcium  Carbonate-Vit D-Min (CALCIUM  1200 PO) Take 1,200 mg by mouth daily.     cetirizine (ZYRTEC) 10 MG tablet Take 10 mg by mouth daily.     cholecalciferol (VITAMIN D3) 25 MCG (1000 UNIT) tablet Take 1,000 Units by mouth daily.     Coenzyme Q10 (CO Q 10 PO) Take by mouth.     escitalopram  (LEXAPRO ) 20 MG tablet TAKE 1 TABLET BY MOUTH EVERY DAY 90 tablet 1   famotidine  (PEPCID ) 20 MG tablet Take 1 tablet (20 mg total) by mouth 2 (two) times daily as needed for heartburn or indigestion. 180 tablet 3   Multiple Vitamin (MULTIVITAMIN PO) Take by mouth.     Probiotic Product (PROBIOTIC PO) Take by mouth.     Red Yeast Rice Extract (RED YEAST RICE PO) Take by mouth.     rosuvastatin  (CRESTOR ) 5 MG tablet TAKE 1 TABLET (5 MG TOTAL) BY MOUTH DAILY. 90 tablet 1   No current facility-administered medications for this visit.    ALLERGIES:  Allergies[1]  LABORATORY DATA:  I have reviewed the labs as listed.     Latest Ref Rng & Units 03/21/2024   11:36 AM 01/18/2024    8:35 AM 03/15/2023   12:11 PM  CBC  WBC 4.0 - 10.5 K/uL 8.8  4.4  8.2   Hemoglobin 12.0 - 15.0 g/dL 86.8  86.2  87.1   Hematocrit 36.0 - 46.0 % 40.6  42.1  40.7   Platelets 150 - 400 K/uL 317  336  302       Latest Ref Rng & Units 03/21/2024   11:36 AM 01/18/2024    8:35 AM 03/15/2023   12:11 PM  CMP  Glucose 70 - 99 mg/dL 88  91  86   BUN 8 - 23 mg/dL 21  12  24    Creatinine 0.44 - 1.00 mg/dL 9.17  9.07  9.26   Sodium 135 - 145 mmol/L 139  141  135   Potassium 3.5 - 5.1 mmol/L 4.3  4.5  3.6   Chloride 98 - 111 mmol/L 101  103  100   CO2 22 - 32 mmol/L 26  26  27    Calcium  8.9 - 10.3 mg/dL 9.6  9.9  9.6   Total Protein 6.5 - 8.1 g/dL 7.1  6.8  7.3   Total Bilirubin 0.0 - 1.2 mg/dL 0.4  0.5  0.3   Alkaline Phos  38 - 126 U/L 56  58  55   AST 15 - 41 U/L 30  31  33   ALT 0 - 44 U/L 14  17  23      DIAGNOSTIC IMAGING:  I have independently reviewed the scans and discussed with the patient. CT Chest Wo Contrast Result Date: 03/21/2024 CLINICAL DATA:  Non-small cell lung cancer (NSCLC), monitor. * Tracking Code: BO * EXAM: CT CHEST WITHOUT CONTRAST TECHNIQUE: Multidetector CT imaging of the chest was performed following the standard protocol without IV contrast. RADIATION DOSE REDUCTION: This exam was performed according to the departmental dose-optimization program which includes automated exposure control, adjustment of the mA and/or kV according to patient size and/or use of iterative reconstruction technique. COMPARISON:  CT scan chest from 03/15/2023. FINDINGS: Cardiovascular: Normal cardiac size. No pericardial effusion.  No aortic aneurysm. There are coronary artery calcifications, in keeping with coronary artery disease. There are also mild to moderate peripheral atherosclerotic vascular calcifications of thoracic aorta and its major branches. Mediastinum/Nodes: Visualized thyroid gland appears grossly unremarkable. No solid / cystic mediastinal masses. The esophagus is nondistended precluding optimal assessment.No mediastinal or axillary lymphadenopathy by size criteria. Evaluation of bilateral hila is limited due to lack on intravenous contrast: however, no large hilar lymphadenopathy identified. Lungs/Pleura: The central tracheo-bronchial tree is patent. Redemonstration of postsurgical changes from prior right lower lobectomy. There are patchy areas of linear, plate-like atelectasis and/or scarring throughout bilateral lungs. No mass or consolidation. No pleural effusion or pneumothorax. Redemonstration of multiple sub 4 mm calcified and noncalcified nodules throughout bilateral lungs (marked with electronic arrow sign on series 2012). There are also several filling defects in the distal subsegmental bronchial  tree (for example, series 2012, image 76, 112, etc.), Favored to represent mucous/secretion. No new or suspicious lung nodule. Upper Abdomen: Extensive scattered calcified granulomas noted throughout the spleen. Remaining visualized upper abdominal viscera within normal limits. Musculoskeletal: The visualized soft tissues of the chest wall are grossly unremarkable. No suspicious osseous lesions. There are mild multilevel degenerative changes in the visualized spine. IMPRESSION: 1. Redemonstration of postsurgical changes from prior right lower lobectomy. 2. Stable pre-existing sub 4 mm lung nodules. No new or suspicious lung nodule. No lung mass, consolidation, pleural effusion or pneumothorax. 3. Otherwise essentially unremarkable exam, as described above. Aortic Atherosclerosis (ICD10-I70.0). Electronically Signed   By: Ree Molt M.D.   On: 03/21/2024 14:47     WRAP UP:  All questions were answered. The patient knows to call the clinic with any problems, questions or concerns.  Medical decision making: Moderate  Time spent on visit: I spent 20 minutes counseling the patient face to face. The total time spent in the appointment was 30 minutes and more than 50% was on counseling.  Pleasant CHRISTELLA Barefoot, PA-C  03/28/24 8:40 AM      [1]  Allergies Allergen Reactions   Sulfa Antibiotics Hives and Itching   Amoxicillin-Pot Clavulanate Nausea And Vomiting   Cefdinir Nausea And Vomiting and Diarrhea   Ciprofloxacin Hives and Itching   Clarithromycin Hives and Itching   Other     Bioxin    Penicillins     Pt states she isn't allergic to PCN. She was tested in [place]  and they told her she was not allergic to it. She would like the allergy removed from her chart.    "

## 2024-03-28 ENCOUNTER — Inpatient Hospital Stay: Admitting: Physician Assistant

## 2024-03-28 VITALS — BP 129/73 | HR 70 | Temp 98.7°F | Resp 18 | Ht 66.0 in | Wt 135.0 lb

## 2024-03-28 DIAGNOSIS — C3491 Malignant neoplasm of unspecified part of right bronchus or lung: Secondary | ICD-10-CM | POA: Diagnosis not present

## 2024-03-28 NOTE — Patient Instructions (Signed)
 [facility] at [facility] **VISIT SUMMARY & IMPORTANT INSTRUCTIONS **   You were seen today by Pleasant Barefoot PA-C for your history of lung cancer.    HISTORY OF LUNG CANCER Your most recent labs, CT scan, and physical exam did not show any evidence of recurrent lung cancer. We will schedule you for CT scan of your chest in 1 year (around 03/22/2025). We will see you for labs and follow-up visit in 1 year.  Please notify us  in between visits if you have any persistent new onset cough, coughing up blood, difficulty breathing, chest pain, recurrent lung infections, or significant unintentional weight loss.  FOLLOW-UP APPOINTMENT: 1 year  ** Thank you for trusting me with your healthcare!  I strive to provide all of my patients with quality care at each visit.  If you receive a survey for this visit, I would be so grateful to you for taking the time to provide feedback.  Thank you in advance!  ~ Hargis Vandyne                                        Dr. Mickiel Davonna Pleasant Barefoot, PA-C          Delon Hope, NP   - - - - - - - - - - - - - - - - - -    Thank you for choosing [facility] at [facility] to provide your oncology and hematology care.  To afford each patient quality time with our provider, please arrive at least 15 minutes before your scheduled appointment time.   If you have a lab appointment with the Cancer Center please come in thru the Main Entrance and check in at the main information desk.  You need to re-schedule your appointment should you arrive 10 or more minutes late.  We strive to give you quality time with our providers, and arriving late affects you and other patients whose appointments are after yours.  Also, if you no show three or more times for appointments you may be dismissed from the clinic at the providers discretion.     Again, thank you for choosing [facility].  Our hope is that these  requests will decrease the amount of time that you wait before being seen by our physicians.       _____________________________________________________________  Should you have questions after your visit to [facility], please contact our office at [phone] and follow the prompts.  Our office hours are 8:00 a.m. and 4:30 p.m. Monday - Friday.  Please note that voicemails left after 4:00 p.m. may not be returned until the following business day.  We are closed weekends and major holidays.  You do have access to a nurse 24-7, just call the main number to the clinic [phone] and do not press any options, hold on the line and a nurse will answer the phone.    For prescription refill requests, have your pharmacy contact our office and allow 72 hours.

## 2024-05-15 ENCOUNTER — Ambulatory Visit: Admitting: Neurology

## 2024-07-09 ENCOUNTER — Ambulatory Visit: Admitting: Family Medicine

## 2024-10-05 ENCOUNTER — Ambulatory Visit: Payer: Self-pay

## 2025-03-25 ENCOUNTER — Inpatient Hospital Stay

## 2025-03-25 ENCOUNTER — Ambulatory Visit (HOSPITAL_COMMUNITY)

## 2025-04-01 ENCOUNTER — Inpatient Hospital Stay: Admitting: Physician Assistant
